# Patient Record
Sex: Male | Born: 1955
Health system: Southern US, Community
[De-identification: ages and names within clinical notes are randomized; demographics above are authoritative.]

## PROBLEM LIST (undated history)

## (undated) DIAGNOSIS — R011 Cardiac murmur, unspecified: Secondary | ICD-10-CM

## (undated) DIAGNOSIS — I82409 Acute embolism and thrombosis of unspecified deep veins of unspecified lower extremity: Secondary | ICD-10-CM

## (undated) DIAGNOSIS — E7800 Pure hypercholesterolemia, unspecified: Secondary | ICD-10-CM

## (undated) DIAGNOSIS — G4730 Sleep apnea, unspecified: Secondary | ICD-10-CM

## (undated) DIAGNOSIS — K219 Gastro-esophageal reflux disease without esophagitis: Secondary | ICD-10-CM

## (undated) DIAGNOSIS — E785 Hyperlipidemia, unspecified: Secondary | ICD-10-CM

## (undated) DIAGNOSIS — M1990 Unspecified osteoarthritis, unspecified site: Secondary | ICD-10-CM

## (undated) DIAGNOSIS — Z87442 Personal history of urinary calculi: Secondary | ICD-10-CM

## (undated) DIAGNOSIS — C801 Malignant (primary) neoplasm, unspecified: Secondary | ICD-10-CM

## (undated) HISTORY — DX: Hyperlipidemia, unspecified: E78.5

## (undated) HISTORY — PX: IRRIGATION AND DEBRIDEMENT SEBACEOUS CYST: SHX5255

## (undated) HISTORY — DX: Acute embolism and thrombosis of unspecified deep veins of unspecified lower extremity: I82.409

## (undated) HISTORY — DX: Pure hypercholesterolemia, unspecified: E78.00

## (undated) HISTORY — PX: WISDOM TOOTH EXTRACTION: SHX21

## (undated) HISTORY — PX: INGUINAL HERNIA REPAIR: SUR1180

---

## 2008-10-18 ENCOUNTER — Ambulatory Visit: Payer: Self-pay | Admitting: Pulmonary Disease

## 2008-10-18 DIAGNOSIS — R0989 Other specified symptoms and signs involving the circulatory and respiratory systems: Secondary | ICD-10-CM | POA: Insufficient documentation

## 2008-10-18 DIAGNOSIS — E785 Hyperlipidemia, unspecified: Secondary | ICD-10-CM | POA: Insufficient documentation

## 2008-10-18 DIAGNOSIS — R0609 Other forms of dyspnea: Secondary | ICD-10-CM

## 2008-10-18 DIAGNOSIS — G4730 Sleep apnea, unspecified: Secondary | ICD-10-CM | POA: Insufficient documentation

## 2008-11-16 ENCOUNTER — Ambulatory Visit (HOSPITAL_BASED_OUTPATIENT_CLINIC_OR_DEPARTMENT_OTHER): Admission: RE | Admit: 2008-11-16 | Discharge: 2008-11-16 | Payer: Self-pay | Admitting: Pulmonary Disease

## 2008-11-16 ENCOUNTER — Encounter: Payer: Self-pay | Admitting: Pulmonary Disease

## 2008-11-22 ENCOUNTER — Ambulatory Visit: Payer: Self-pay | Admitting: Pulmonary Disease

## 2008-12-02 ENCOUNTER — Encounter: Payer: Self-pay | Admitting: Pulmonary Disease

## 2008-12-30 ENCOUNTER — Encounter: Payer: Self-pay | Admitting: Pulmonary Disease

## 2009-01-09 ENCOUNTER — Encounter: Payer: Self-pay | Admitting: Pulmonary Disease

## 2010-05-28 ENCOUNTER — Ambulatory Visit: Payer: Self-pay | Admitting: Cardiology

## 2010-05-29 ENCOUNTER — Ambulatory Visit: Payer: Self-pay | Admitting: Cardiology

## 2010-06-12 ENCOUNTER — Encounter: Payer: Self-pay | Admitting: Pulmonary Disease

## 2010-07-16 NOTE — Letter (Signed)
Summary: [facility]   Imported By: Sherian Rein 07/10/2010 07:50:05  _____________________________________________________________________  External Attachment:    Type:   Image     Comment:   External Document

## 2010-07-20 ENCOUNTER — Other Ambulatory Visit (INDEPENDENT_AMBULATORY_CARE_PROVIDER_SITE_OTHER): Payer: BC Managed Care – PPO

## 2010-07-20 DIAGNOSIS — Z79899 Other long term (current) drug therapy: Secondary | ICD-10-CM

## 2010-07-20 DIAGNOSIS — E7800 Pure hypercholesterolemia, unspecified: Secondary | ICD-10-CM

## 2010-10-27 NOTE — Procedures (Signed)
NAME:  Phillip Hester, Phillip Hester            ACCOUNT NO.:  [id number]   MEDICAL RECORD NO.:  [id number]          PATIENT TYPE:  OUT   LOCATION:  SLEEP CENTER                 FACILITY:  [facility]   PHYSICIAN:  Oretha Milch, MD      DATE OF BIRTH:  [birth year]   DATE OF STUDY:  11/16/2008                            NOCTURNAL POLYSOMNOGRAM   REFERRING PHYSICIAN:   INDICATION FOR THE STUDY:  Loud snoring, witnessed apneas, leg and body  jerks and excessive somnolence in this 55 year old gentleman with a  weight of 221 pounds, height of 6 feet 2 inches, BMI of 28, neck size of  17 inches, Epworth sleepiness score of 5.   BEDTIME MEDICATIONS:  Melatonin.   This intervention polysomnogram was performed with a sleep technologist  in attendance.  EEG, EOG, EMG, EKG and respiratory parameters were  recorded.  Sleep stages arousals, limb movements and respiratory data  were scored according to criteria laid out by the American Academy of  Sleep Medicine.   SLEEP ARCHITECTURE:  Lights out was at 10:08 p.m.  Lights on was at 4:54  a.m.  CPAP was initiated at 32 minutes past midnight.  During the  diagnostic portion, total sleep time was 120 minutes with a sleep period  time of 132 minutes, sleep latency of 11.5 minutes, and a REM latency of  77.5 minutes with a sleep efficiency of 83%.  Sleep stages as a  percentage of total sleep time was N1 5.8%, N2 82%, N3 4%, and REM sleep  7.5.% (9 minutes).  He spent 38 minutes in supine sleep and 9 minutes in  supine REM sleep.  During the titration portion, REM sleep accounted for  109 minutes (47%) and supine REM sleep accounted for 66 minutes.  Long  stages of REM sleep was noted starting at 3:00 a.m.   RESPIRATORY DATA:  During the diagnostic portion, there were a total of  10 obstructive apnea, 0 central apneas, 1 mixed apnea, and 38 hypopneas  and 28 respiratory effort-related arousals with an apnea/hypopnea index  of 24.5 events per hour and a respiratory  disturbance index of 38.5  events per hour.  Due to this degree of respiratory disturbance, his  CPAP was initiated at 6 cm and titrated to 10 cm for RERAs and snoring.  The level of 8 cm were 60 minutes including 27 minutes of REM sleep, 3  obstructive apneas, and 1 central apnea were noted with the lowest  desaturation of 91%.  At a final level of 10 cm for 39 minutes including  17 minutes of REM sleep, no events of desaturation were noted.   AROUSAL DATA:  During the diagnostic portion, the arousal index was 48.5  events per hour.  Few arousals were noted at the higher levels of CPAP.   LIMB MOVEMENT DATA:  No significant limb movements were noted.   OXYGEN SATURATION DATA:  The lowest desaturation during the diagnostic  portion was 86%.  Oxygen saturation stayed about 91% during CPAP  titration.   CARDIAC DATA:  No significant arrhythmias were noted.  The low heart  rate during the diagnostic portion was 40 beats per minute.  The high  heart rate recorded was an artifact.   DISCUSSION:  Predominantly, supine-related events.  Supine AHI was 59  per hour and supine REM AHI was 87 events per hour, nonsupine AHI was 8  events per hour.  CPAP was titrated higher due to RERAs.   IMPRESSION:  1. Moderate obstructive sleep apnea with hypopneas causing sleep      fragmentation and oxygen desaturation, this was especially      pronounced during supine sleep.  2. No evidence of cardiac arrhythmias, limb movements, or behavioral      disturbance during sleep.  3. Sleep state misperception.  On the post-questionnaire study, the      patient seemed to over estimate his sleep latency and underestimate      his total sleep time.   RECOMMENDATIONS:  1. Treatment options for this degree of sleep apnea include CPAP      therapy. Although positional therapy could be considered, I doubt      that this would fully treat this degree of sleep disorder      breathing.  2. CPAP should be initiated  with a goal of 10 cm with a large full-      face mask.  3. He should be monitored for compliance at this level.  He should be      asked to avoid medications with sedative side effects.  He should      be cautioned against driving when sleepy.      Oretha Milch, MD  Electronically Signed     RVA/MEDQ  D:  11/22/2008 14:43:12  T:  11/23/2008 02:31:16  Job:  [id number]

## 2010-11-17 ENCOUNTER — Encounter: Payer: Self-pay | Admitting: Cardiology

## 2010-12-10 ENCOUNTER — Ambulatory Visit: Payer: BC Managed Care – PPO | Admitting: Cardiology

## 2010-12-11 ENCOUNTER — Ambulatory Visit (INDEPENDENT_AMBULATORY_CARE_PROVIDER_SITE_OTHER): Payer: BC Managed Care – PPO | Admitting: Cardiology

## 2010-12-11 ENCOUNTER — Other Ambulatory Visit (INDEPENDENT_AMBULATORY_CARE_PROVIDER_SITE_OTHER): Payer: BC Managed Care – PPO | Admitting: *Deleted

## 2010-12-11 ENCOUNTER — Encounter: Payer: Self-pay | Admitting: Cardiology

## 2010-12-11 VITALS — BP 122/84 | HR 62 | Ht 75.0 in | Wt 225.4 lb

## 2010-12-11 DIAGNOSIS — E785 Hyperlipidemia, unspecified: Secondary | ICD-10-CM

## 2010-12-11 DIAGNOSIS — Z9189 Other specified personal risk factors, not elsewhere classified: Secondary | ICD-10-CM

## 2010-12-11 DIAGNOSIS — Z136 Encounter for screening for cardiovascular disorders: Secondary | ICD-10-CM

## 2010-12-11 DIAGNOSIS — E7800 Pure hypercholesterolemia, unspecified: Secondary | ICD-10-CM

## 2010-12-11 LAB — LIPID PANEL
HDL: 46.2 mg/dL (ref >39.00)
Total CHOL/HDL Ratio: 5
Triglycerides: 165 mg/dL — ABNORMAL HIGH (ref 0.0–149.0)

## 2010-12-11 LAB — LDL CHOLESTEROL, DIRECT: Direct LDL: 179.5 mg/dL

## 2010-12-11 MED ORDER — CELECOXIB 100 MG PO CAPS: 100.0000 mg | ORAL_CAPSULE | ORAL | Status: DC | PRN

## 2010-12-11 MED ORDER — EPINEPHRINE 0.3 MG/0.3ML IJ DEVI: 0.3000 mg | Freq: Once | INTRAMUSCULAR | Status: DC

## 2010-12-11 NOTE — Patient Instructions (Signed)
Lab today--lipid profile 272.0  Take aspirin 81mg  daily.   Your physician wants you to follow-up in: 1 year with Dr Shirlee Latch.(June 2013). You will receive a reminder letter in the mail two months in advance. If you don't receive a letter, please call our office to schedule the follow-up appointment.

## 2010-12-13 DIAGNOSIS — Z9189 Other specified personal risk factors, not elsewhere classified: Secondary | ICD-10-CM | POA: Insufficient documentation

## 2010-12-13 NOTE — Assessment & Plan Note (Signed)
Last lipids were excellent but he was on simvastatin at that time. He now uses red yeast rice extract.  I will get a lipid profile to assess current levels.  Ideally would keep LDL < 100, but would accept LDL < 130.

## 2010-12-13 NOTE — Assessment & Plan Note (Signed)
Patient seems to be doing well from a cardiac perspective.  Would make sure that lipids are reasonably controlled.  He should also start ASA 81 mg daily.   I will see him back in a year.

## 2010-12-13 NOTE — Progress Notes (Signed)
PCP: Dr. Janace Hoard  55 yo with history of OSA and hyperlipidemia presents for cardiology followup.  He has been seen by Dr. Deborah Chalk in the past and is seen by me for the first time today.  He has a history of atypical chest pain and had a normal ETT-myoview with good exercise tolerance in 2010.  No chest pain since that time.  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  He denies exertional dyspnea and walks/swims for exercise with no problems.  He does not smoke. Blood pressure is under good control.  He has a diagnosis of hyperlipidemia and was on simvastatin in the past.  Last lipid check was on simvastatin and looked good.  He no longer is taking simvastatin and is instead on red yeast rice extract.    ECG: NSR, normal  Labs (2/12): K 4.9, creatinine 0.98, LDL 74, HDL 46  PMH: 1. OSA: on CPAP 2. Hyperlipidemia 3. H/o basal cell carcinoma 4. GERD 5. Echo (6/10): EF 60%, mild LVH, aortic sclerosis without stenosis, normal diastolic function. 6. ETT-myoview (2010): 13' exercise, EF 76%, no evidence for ischemia or infarction.   SH: Nonsmoker.  2-3 beers/week.  Married with 2 daughters.  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   FH: Father with CVA at 24, HTN  ROS: All systems reviewed and negative except as per HPI.   Allergies: Bee stings  Current Outpatient Prescriptions  Medication Sig Dispense Refill  . aspirin 81 MG tablet Take 81 mg by mouth daily. When remembered        . b complex vitamins tablet Take 1 tablet by mouth daily.        . celecoxib (CELEBREX) 100 MG capsule Take 1 capsule (100 mg total) by mouth as needed.  30 capsule  0  . Grape Seed 100 MG CAPS Take 1 capsule by mouth daily.        . Multiple Vitamin (MULTIVITAMIN) tablet Take 1 tablet by mouth daily.        . Omega-3 Fatty Acids (FISH OIL) 1000 MG CAPS Take 4 capsules by mouth daily.        . Red Yeast Rice 600 MG CAPS Take 1 capsule by mouth daily.        . vitamin C  (ASCORBIC ACID) 500 MG tablet Take 500 mg by mouth daily.        Marland Kitchen aspirin EC 81 MG tablet Take 1 tablet (81 mg total) by mouth daily.      Marland Kitchen EPINEPHrine (EPI-PEN) 0.3 mg/0.3 mL DEVI Inject 0.3 mLs (0.3 mg total) into the muscle once.  1 Device  0    BP 122/84  Pulse 62  Ht 6\' 3"  (1.905 m)  Wt 225 lb 6.4 oz (102.241 kg)  BMI 28.17 kg/m2 General: NAD Neck: No JVD, no thyromegaly or thyroid nodule.  Lungs: Clear to auscultation bilaterally with normal respiratory effort. CV: Nondisplaced PMI.  Heart regular S1/S2, no S3/S4, no murmur.  No peripheral edema.  No carotid bruit.  Normal pedal pulses.  Abdomen: Soft, nontender, no hepatosplenomegaly, no distention.  Neurologic: Alert and oriented x 3.  Psych: Normal affect. Extremities: No clubbing or cyanosis.

## 2010-12-22 ENCOUNTER — Other Ambulatory Visit: Payer: Self-pay | Admitting: Cardiology

## 2010-12-22 DIAGNOSIS — E7800 Pure hypercholesterolemia, unspecified: Secondary | ICD-10-CM

## 2010-12-22 MED ORDER — SIMVASTATIN 20 MG PO TABS: 20.0000 mg | ORAL_TABLET | Freq: Every evening | ORAL | Status: DC

## 2010-12-22 NOTE — Telephone Encounter (Signed)
Message copied by Jacqlyn Krauss on Tue Dec 22, 2010  3:02 PM ------      Message from: Laurey Morale      Created: Sun Dec 13, 2010  5:02 PM       LDL up to 180 (very high).  He should go back on simvastatin 20 mg daily with lipids/LFTs in 2 months.

## 2010-12-22 NOTE — Telephone Encounter (Signed)
Notes Recorded by Jacqlyn Krauss, RN on 12/22/2010 at 3:02 PM It discussed results with pt. I will mail him a prescription for Simvastatin 20mg  daily in the evening. He will return for a fasting lipid/liver profile 02/24/11. Notes Recorded by Jacqlyn Krauss, RN on 12/22/2010 at 9:01 AM Pt to call me back. Notes Recorded by Jacqlyn Krauss, RN on 12/21/2010 at 5:32 PM Pt not at home. NA at work number [phone] Notes Recorded by Jacqlyn Krauss, RN on 12/21/2010 at 3:22 PM LMTCB Notes Recorded by Jacqlyn Krauss, RN on 12/21/2010 at 12:13 PM LMTCB Notes Recorded by Marca Ancona, MD on 12/13/2010 at 5:02 PM LDL up to 180 (very high). He should go back on simvastatin 20 mg daily with lipids/LFTs in 2 months.

## 2010-12-22 NOTE — Telephone Encounter (Signed)
Returning call from Phillip L.

## 2010-12-23 NOTE — Telephone Encounter (Signed)
Script for simvastatin  mailed to pt . /cy

## 2011-02-24 ENCOUNTER — Other Ambulatory Visit (INDEPENDENT_AMBULATORY_CARE_PROVIDER_SITE_OTHER): Payer: BC Managed Care – PPO | Admitting: *Deleted

## 2011-02-24 DIAGNOSIS — E7800 Pure hypercholesterolemia, unspecified: Secondary | ICD-10-CM

## 2011-02-24 LAB — LIPID PANEL
Cholesterol: 159 mg/dL (ref 0–200)
HDL: 43.4 mg/dL (ref >39.00)
LDL Cholesterol: 93 mg/dL (ref 0–99)
Triglycerides: 115 mg/dL (ref 0.0–149.0)
VLDL: 23 mg/dL (ref 0.0–40.0)

## 2011-02-24 LAB — HEPATIC FUNCTION PANEL
Albumin: 4.2 g/dL (ref 3.5–5.2)
Alkaline Phosphatase: 38 U/L — ABNORMAL LOW (ref 39–117)
Total Protein: 6.8 g/dL (ref 6.0–8.3)

## 2011-02-26 ENCOUNTER — Encounter: Payer: Self-pay | Admitting: *Deleted

## 2011-06-14 ENCOUNTER — Other Ambulatory Visit: Payer: Self-pay

## 2011-06-14 MED ORDER — SIMVASTATIN 20 MG PO TABS: 20.0000 mg | ORAL_TABLET | Freq: Every evening | ORAL | Status: DC

## 2011-06-15 HISTORY — PX: KNEE SURGERY: SHX244

## 2011-06-15 HISTORY — PX: SHOULDER SURGERY: SHX246

## 2011-06-17 ENCOUNTER — Other Ambulatory Visit: Payer: Self-pay | Admitting: *Deleted

## 2011-06-17 MED ORDER — SIMVASTATIN 20 MG PO TABS: 20.0000 mg | ORAL_TABLET | Freq: Every evening | ORAL | Status: DC

## 2011-11-03 DIAGNOSIS — N281 Cyst of kidney, acquired: Secondary | ICD-10-CM | POA: Insufficient documentation

## 2011-11-03 DIAGNOSIS — N529 Male erectile dysfunction, unspecified: Secondary | ICD-10-CM | POA: Insufficient documentation

## 2011-11-03 DIAGNOSIS — N200 Calculus of kidney: Secondary | ICD-10-CM | POA: Insufficient documentation

## 2011-11-10 ENCOUNTER — Ambulatory Visit
Admission: RE | Admit: 2011-11-10 | Discharge: 2011-11-10 | Disposition: A | Discharge status: Home / Self Care | Payer: BC Managed Care – PPO | Source: Ambulatory Visit | Attending: Sports Medicine | Admitting: Sports Medicine

## 2011-11-10 ENCOUNTER — Other Ambulatory Visit: Payer: Self-pay | Admitting: Sports Medicine

## 2011-11-10 DIAGNOSIS — M549 Dorsalgia, unspecified: Secondary | ICD-10-CM

## 2011-11-10 DIAGNOSIS — M25569 Pain in unspecified knee: Secondary | ICD-10-CM

## 2012-01-26 ENCOUNTER — Telehealth: Payer: Self-pay | Admitting: Cardiology

## 2012-01-26 NOTE — Telephone Encounter (Signed)
New Problem:     I called the patient and was unable to reach them. I left a message on their voicemail with my name, the reason I called, the name of his physician, and a number to call back to schedule their appointment. 

## 2012-02-21 ENCOUNTER — Encounter: Payer: Self-pay | Admitting: Cardiology

## 2012-03-06 ENCOUNTER — Ambulatory Visit (INDEPENDENT_AMBULATORY_CARE_PROVIDER_SITE_OTHER): Payer: BC Managed Care – PPO | Admitting: Family Medicine

## 2012-03-06 VITALS — BP 106/76 | HR 81 | Temp 98.2°F | Resp 16 | Ht 75.0 in | Wt 224.0 lb

## 2012-03-06 DIAGNOSIS — T7840XA Allergy, unspecified, initial encounter: Secondary | ICD-10-CM

## 2012-03-06 MED ORDER — PREDNISONE 20 MG PO TABS: ORAL_TABLET | ORAL | Status: DC

## 2012-03-06 NOTE — Progress Notes (Signed)
[facility] [address] [place] [address] [phone]  Date:  03/06/2012   Name:  Phillip Hester   DOB:  January 21, 1956   MRN:  [id number]  PCP:  Sheila Oats, MD    Chief Complaint: Facial Swelling and Rash   History of Present Illness:  Phillip Hester is a 56 y.o. very pleasant male patient who presents with the following:  On Saturday pm he was doing some work outside.  He felt a little irritation on the corner or his right eye,? like an insect bite but not a bee sting.  He brushed the irritation away and did not think much else of it.  Yesterday he had some swelling around the eye. He tried some benadryl.  Then today he woke up with worse swelling around the right eye- both the upper and lower lids are swollen.   He also developed an itchy rash down his right neck and shoulder over night.   The swollen eye feels warm, and is slightly itchy.  No pain.   They eyeball itself seems ok- it is not tender and his vision seems ok.   No crusting or discharge from the eye.   He has not noted any lip or tongue swelling, no SOB.  He does have an epipen at home.    Maclan has felt "nutty" with prednisone in the past, but has not had an allergic reaction or psychosis.  He is willing to take this again if it will get his swelling better faster.   Patient Active Problem List  Diagnosis  . HYPERLIPIDEMIA  . OBSTRUCTIVE SLEEP APNEA  . SNORING  . Cardiovascular risk factor    Past Medical History  Diagnosis Date  . Hyperlipidemia     Past Surgical History  Procedure Date  . Wisdom tooth extraction   . Irrigation and debridement sebaceous cyst     History  Substance Use Topics  . Smoking status: Never Smoker   . Smokeless tobacco: Never Used  . Alcohol Use: 1.0 - 1.5 oz/week    2-3 drink(s) per week     occasional    No family history on file.  Allergies  Allergen Reactions  . Prednisone     REACTION: "drove me nutty"    Medication list  has been reviewed and updated.  Current Outpatient Prescriptions on File Prior to Visit  Medication Sig Dispense Refill  . aspirin 81 MG tablet Take 81 mg by mouth daily. When remembered        . celecoxib (CELEBREX) 100 MG capsule Take 1 capsule (100 mg total) by mouth as needed.  30 capsule  0  . EPINEPHrine (EPI-PEN) 0.3 mg/0.3 mL DEVI Inject 0.3 mLs (0.3 mg total) into the muscle once.  1 Device  0  . Multiple Vitamin (MULTIVITAMIN) tablet Take 1 tablet by mouth daily.        . Omega-3 Fatty Acids (FISH OIL) 1000 MG CAPS Take 4 capsules by mouth daily.        . pantoprazole (PROTONIX) 20 MG tablet Take 20 mg by mouth daily.      . Red Yeast Rice 600 MG CAPS Take 1 capsule by mouth daily.        . vitamin C (ASCORBIC ACID) 500 MG tablet Take 500 mg by mouth daily.        Marland Kitchen aspirin EC 81 MG tablet Take 1 tablet (81 mg total) by mouth daily.      Marland Kitchen b complex  vitamins tablet Take 1 tablet by mouth daily.        . Grape Seed 100 MG CAPS Take 1 capsule by mouth daily.        . simvastatin (ZOCOR) 20 MG tablet Take 1 tablet (20 mg total) by mouth every evening.  30 tablet  6    Review of Systems:  As per HPI- otherwise negative.   Physical Examination: Filed Vitals:   03/06/12 0944  BP: 106/76  Pulse: 81  Temp: 98.2 F (36.8 C)  Resp: 16   Filed Vitals:   03/06/12 0944  Height: 6\' 3"  (1.905 m)  Weight: 224 lb (101.606 kg)   Body mass index is 28.00 kg/(m^2). Ideal Body Weight: Weight in (lb) to have BMI = 25: 199.6   GEN: WDWN, NAD, Non-toxic, A & O x 3 HEENT: Atraumatic, Normocephalic. Neck supple. No masses, No LAD.  Bilateral TM wnl, oropharynx normal.  PEERL,EOMI. Swelling of the right upper and lower lid, not enough to close the eye.    There is swelling and redness, and a rhus dermatitis rash above the right eye, onto the right forehead and down the right side of his neck.  No vesicles or suggestion of zoster.  No discharge from eye, no injection of the conjunctiva.    Ears and Nose: No external deformity. CV: RRR, No M/G/R. No JVD. No thrill. No extra heart sounds. PULM: CTA B, no wheezes, crackles, rhonchi. No retractions. No resp. distress. No accessory muscle use. EXTR: No c/c/e NEURO Normal gait.  PSYCH: Normally interactive. Conversant. Not depressed or anxious appearing.  Calm demeanor.    Assessment and Plan: 1. Allergic reaction  predniSONE (DELTASONE) 20 MG tablet   [name] is likley having a contact allergic reaction- perhaps due to a plant.  I think an insect sting reaction is less likely given the delay in symptoms and the pattern around the eye AND down the neck.  Discussed this with him in detail. He wonders if a cream will be helpful, but as his symptoms are worse right around his eye systemic treatment is probably better. Let him know that he does not have to take pred and that he will get better with time, but he would like to try and get better more quickly and is ok with trying prednisone.  He can stop at any time if he cannot tolerate the medication.    See pt instructions for more.   Abbe Amsterdam, MD

## 2012-03-06 NOTE — Patient Instructions (Addendum)
Take benadryl as needed- you may want to just use this at night.  You can also take claritin or zyrtec during the day.  Take a zantac pill once a day as well  Let me know if you are not able to tolerate the prednisone/ steroid pill.  You can stop at any time.    Let me know if you are not better within the next 2 days- Sooner if worse.

## 2012-03-22 ENCOUNTER — Other Ambulatory Visit: Payer: Self-pay | Admitting: Physical Medicine and Rehabilitation

## 2012-03-22 ENCOUNTER — Ambulatory Visit
Admission: RE | Admit: 2012-03-22 | Discharge: 2012-03-22 | Disposition: A | Discharge status: Home / Self Care | Payer: BC Managed Care – PPO | Source: Ambulatory Visit | Attending: Physical Medicine and Rehabilitation | Admitting: Physical Medicine and Rehabilitation

## 2012-03-22 DIAGNOSIS — R918 Other nonspecific abnormal finding of lung field: Secondary | ICD-10-CM

## 2012-05-03 DIAGNOSIS — R6882 Decreased libido: Secondary | ICD-10-CM | POA: Insufficient documentation

## 2012-05-12 ENCOUNTER — Encounter: Payer: Self-pay | Admitting: *Deleted

## 2012-05-15 ENCOUNTER — Ambulatory Visit (INDEPENDENT_AMBULATORY_CARE_PROVIDER_SITE_OTHER): Payer: BC Managed Care – PPO | Admitting: Cardiology

## 2012-05-15 ENCOUNTER — Other Ambulatory Visit: Payer: Self-pay | Admitting: Cardiology

## 2012-05-15 ENCOUNTER — Encounter: Payer: Self-pay | Admitting: Cardiology

## 2012-05-15 VITALS — BP 126/90 | HR 69 | Ht 75.0 in | Wt 227.1 lb

## 2012-05-15 DIAGNOSIS — E7800 Pure hypercholesterolemia, unspecified: Secondary | ICD-10-CM

## 2012-05-15 DIAGNOSIS — E785 Hyperlipidemia, unspecified: Secondary | ICD-10-CM

## 2012-05-15 DIAGNOSIS — Z9189 Other specified personal risk factors, not elsewhere classified: Secondary | ICD-10-CM

## 2012-05-15 NOTE — Patient Instructions (Addendum)
Your physician recommends that you have a NMR,lipoprofile and a lipoprotein A (LPA) today.   Your physician wants you to follow-up in: 1 year with Dr Shirlee Latch. (December 2014). You will receive a reminder letter in the mail two months in advance. If you don't receive a letter, please call our office to schedule the follow-up appointment.

## 2012-05-15 NOTE — Progress Notes (Signed)
Patient ID: Phillip Hester, male   DOB: 1955-06-22, 56 y.o.   MRN: [id number] PCP: Dr. Janace Hoard  56 yo with history of OSA and hyperlipidemia presents for cardiology followup.  He has a history of atypical chest pain and had a normal ETT-myoview with good exercise tolerance in 2010.  No chest pain since that time.  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  He denies exertional dyspnea and walks/swims for exercise with no problems.  He does not smoke. Blood pressure is under good control.  He has a diagnosis of hyperlipidemia and was on simvastatin in the past.  He is now taking only red yeast rice extract.  Lipids looked good in 9/12.  Since I last saw him, he had knee surgery and shoulder surgery with no problems.  He has been tolerating CPAP.   ECG: NSR, inferior T wave inversions.   Labs (2/12): K 4.9, creatinine 0.98, LDL 74, HDL 46 Labs (9/12): LDL 93, HDL 43  PMH: 1. OSA: on CPAP 2. Hyperlipidemia 3. H/o basal cell carcinoma 4. GERD 5. Echo (6/10): EF 60%, mild LVH, aortic sclerosis without stenosis, normal diastolic function. 6. ETT-myoview (2010): 13' exercise, EF 76%, no evidence for ischemia or infarction.   SH: Nonsmoker.  2-3 beers/week.  Married with 2 daughters.  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   FH: Father with CVA at 81, HTN  Allergies: Bee stings  Current Outpatient Prescriptions  Medication Sig Dispense Refill  . aspirin 81 MG tablet Take 81 mg by mouth daily. When remembered        . aspirin EC 81 MG tablet Take 1 tablet (81 mg total) by mouth daily.      . celecoxib (CELEBREX) 100 MG capsule Take 1 capsule (100 mg total) by mouth as needed.  30 capsule  0  . EPINEPHrine (EPI-PEN) 0.3 mg/0.3 mL DEVI Inject 0.3 mLs (0.3 mg total) into the muscle once.  1 Device  0  . HYDROcodone-acetaminophen (NORCO/VICODIN) 5-325 MG per tablet as needed.      . Multiple Vitamin (MULTIVITAMIN) tablet Take 1 tablet by mouth daily.        .  Omega-3 Fatty Acids (FISH OIL) 1000 MG CAPS Take 4 capsules by mouth daily.        . OXYCODONE HCL PO Take by mouth.      . oxyCODONE-acetaminophen (PERCOCET/ROXICET) 5-325 MG per tablet as needed.      . pantoprazole (PROTONIX) 20 MG tablet Take 20 mg by mouth daily.      . Red Yeast Rice 600 MG CAPS Take 1 capsule by mouth daily.        . vitamin C (ASCORBIC ACID) 500 MG tablet Take 500 mg by mouth daily.          BP 126/90  Pulse 69  Ht 6\' 3"  (1.905 m)  Wt 227 lb 1.9 oz (103.021 kg)  BMI 28.39 kg/m2 General: NAD Neck: No JVD, no thyromegaly or thyroid nodule.  Lungs: Clear to auscultation bilaterally with normal respiratory effort. CV: Nondisplaced PMI.  Heart regular S1/S2, no S3/S4, no murmur.  No peripheral edema.  No carotid bruit.  Normal pedal pulses.  Abdomen: Soft, nontender, no hepatosplenomegaly, no distention.  Neurologic: Alert and oriented x 3.  Psych: Normal affect. Extremities: No clubbing or cyanosis.   Assessment/Plan:  HYPERLIPIDEMIA  He is not on a statin at this point, taking red yeast rice extract.  I will get a  Lipomed profile and Lp(a) to further risk stratify him to decide about whether or not to restart statin.  Cardiovascular risk factor  Patient seems to be doing well from a cardiac perspective. Would make sure that lipids are reasonably controlled. He should continue ASA 81 mg daily.   Followup in 1 year.   Marca Ancona 05/15/2012 2:15 PM

## 2012-05-17 LAB — OTHER SOLSTAS TEST
HDL Size: 8.2 nm — ABNORMAL LOW (ref >=9.2)
LP-IR Score: 65 — ABNORMAL HIGH (ref <=45)
Large HDL-P: <1.3 umol/L — ABNORMAL LOW (ref >=4.8)
Large VLDL-P: 3.1 nmol/L — ABNORMAL HIGH (ref <=2.7)
VLDL Size: 45.4 nm (ref <=46.6)

## 2012-08-03 DIAGNOSIS — F32A Depression, unspecified: Secondary | ICD-10-CM | POA: Insufficient documentation

## 2012-08-24 ENCOUNTER — Ambulatory Visit: Payer: BC Managed Care – PPO | Admitting: Family Medicine

## 2012-08-24 VITALS — BP 118/82 | HR 72 | Resp 18 | Ht 74.0 in | Wt 231.0 lb

## 2012-08-24 DIAGNOSIS — F32A Depression, unspecified: Secondary | ICD-10-CM

## 2012-08-24 DIAGNOSIS — F329 Major depressive disorder, single episode, unspecified: Secondary | ICD-10-CM

## 2012-08-24 MED ORDER — DULOXETINE HCL 30 MG PO CPEP: 30.0000 mg | ORAL_CAPSULE | Freq: Every day | ORAL | Status: DC

## 2012-08-24 NOTE — Progress Notes (Signed)
Subjective: Patient is here because of his fatigue and depression. Last year his wife had an affair. This has devastated him. 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 He has not get a lot of relaxation time. He has 2 children. He and his wife it stayed together, and have been getting counseling from Karmen Bongo.  He has also talked with one of his pastors at [employer] where he attends. They're still working on their marriage, but she is talking about moving out. He's tried testosterone, and has not given him energy. He has seen his urologist. His urologist recommended he see somebody and get something for depression. His counts are recommended Cymbalta.  Objective: Depressed-appearing man, not suicidal. A long talk.  Assessment: Marital dysfunction Depression  Plan: Cymbalta 30 daily for one week and then twice daily. Advise that we not make any changes such as with his testosterone at this time for see how the Cymbalta does. See him back in about 3 weeks. Continue getting his counseling.

## 2012-09-19 ENCOUNTER — Telehealth: Payer: Self-pay | Admitting: Cardiology

## 2012-09-19 NOTE — Telephone Encounter (Signed)
New problem   Pt stated some one called him name Eunice Blase that's all he knew. He want someone to call him back.

## 2012-09-19 NOTE — Telephone Encounter (Signed)
LMTCB

## 2012-09-20 NOTE — Telephone Encounter (Signed)
I have been unable to reach pt by telephone to let him know that I did not call him.

## 2013-05-31 ENCOUNTER — Encounter: Payer: Self-pay | Admitting: Cardiology

## 2013-05-31 ENCOUNTER — Other Ambulatory Visit: Payer: Self-pay | Admitting: Cardiology

## 2013-05-31 ENCOUNTER — Encounter (INDEPENDENT_AMBULATORY_CARE_PROVIDER_SITE_OTHER): Payer: Self-pay

## 2013-05-31 ENCOUNTER — Ambulatory Visit (INDEPENDENT_AMBULATORY_CARE_PROVIDER_SITE_OTHER): Payer: BC Managed Care – PPO | Admitting: Cardiology

## 2013-05-31 VITALS — BP 124/86 | HR 94 | Ht 75.0 in | Wt 234.0 lb

## 2013-05-31 DIAGNOSIS — E785 Hyperlipidemia, unspecified: Secondary | ICD-10-CM

## 2013-05-31 DIAGNOSIS — Z9189 Other specified personal risk factors, not elsewhere classified: Secondary | ICD-10-CM

## 2013-05-31 NOTE — Patient Instructions (Signed)
Your physician recommends that you have a NMR lipomed panel today.   Your physician wants you to follow-up in: 1 year with Dr Shirlee Latch. (December 2015).  You will receive a reminder letter in the mail two months in advance. If you don't receive a letter, please call our office to schedule the follow-up appointment.

## 2013-06-01 LAB — NMR LIPOPROFILE WITH LIPIDS
HDL Particle Number: 36 umol/L (ref >=30.5)
HDL-C: 47 mg/dL (ref >=40)
LDL (calc): 126 mg/dL — ABNORMAL HIGH (ref <100)
LP-IR Score: 47 — ABNORMAL HIGH (ref <=45)
Large HDL-P: 5.1 umol/L (ref >=4.8)
Triglycerides: 278 mg/dL — ABNORMAL HIGH (ref <150)

## 2013-06-01 NOTE — Progress Notes (Signed)
Patient ID: Phillip Hester, male   DOB: 1956/02/10, 57 y.o.   MRN: [id number] PCP: Dr. Janace Hoard  57 yo with history of OSA and hyperlipidemia presents for cardiology followup.  He has a history of atypical chest pain and had a normal ETT-myoview with good exercise tolerance in 2010.  No chest pain since that time.  He denies exertional dyspnea and walks/swims for exercise with no problems.  He does not smoke. Blood pressure is under good control.  He has a diagnosis of hyperlipidemia and was on simvastatin in the past.  He is now taking only red yeast rice extract.  Last year I had wanted him to start atorvastatin due to a high LDL particle number but he never started on it.  He is using CPAP.  He is under a lot of stress, going through separation with wife.   ECG: NSR, LVH, inferior Qs   Labs (2/12): K 4.9, creatinine 0.98, LDL 74, HDL 46 Labs (9/12): LDL 93, HDL 43 Labs (2/14): LDL particle number 2303  PMH: 1. OSA: on CPAP 2. Hyperlipidemia 3. H/o basal cell carcinoma 4. GERD 5. Echo (6/10): EF 60%, mild LVH, aortic sclerosis without stenosis, normal diastolic function. 6. ETT-myoview (2010): 13' exercise, EF 76%, no evidence for ischemia or infarction.   SH: Nonsmoker.  2-3 beers/week.  Separated, with 2 daughters.  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   FH: Father with CVA at 72, HTN  Allergies: Bee stings  Current Outpatient Prescriptions  Medication Sig Dispense Refill  . aspirin EC 81 MG tablet Take 1 tablet (81 mg total) by mouth daily.      . celecoxib (CELEBREX) 100 MG capsule Take 1 capsule (100 mg total) by mouth as needed.  30 capsule  0  . CIALIS 5 MG tablet Take by mouth daily as needed.       . clomiPHENE (CLOMID) 50 MG tablet Take 50 mg by mouth daily.      . DULoxetine (CYMBALTA) 30 MG capsule Take 1 capsule (30 mg total) by mouth daily.  60 capsule  3  . EPINEPHrine (EPI-PEN) 0.3 mg/0.3 mL DEVI Inject 0.3 mLs (0.3 mg total) into the muscle once.  1 Device  0   . Multiple Vitamin (MULTIVITAMIN) tablet Take 1 tablet by mouth daily.        . Nutritional Supplements (DHEA PO) Take by mouth.      . Omega-3 Fatty Acids (FISH OIL) 1000 MG CAPS Take 4 capsules by mouth daily.        . pantoprazole (PROTONIX) 20 MG tablet Take 20 mg by mouth daily.      . Red Yeast Rice 600 MG CAPS Take 1 capsule by mouth daily.        . vitamin C (ASCORBIC ACID) 500 MG tablet Take 500 mg by mouth daily.         No current facility-administered medications for this visit.    BP 124/86  Pulse 94  Ht 6\' 3"  (1.905 m)  Wt 106.142 kg (234 lb)  BMI 29.25 kg/m2 General: NAD Neck: No JVD, no thyromegaly or thyroid nodule.  Lungs: Clear to auscultation bilaterally with normal respiratory effort. CV: Nondisplaced PMI.  Heart regular S1/S2, no S3/S4, no murmur.  No peripheral edema.  No carotid bruit.  Normal pedal pulses.  Abdomen: Soft, nontender, no hepatosplenomegaly, no distention.  Neurologic: Alert and oriented x 3.  Psych: Normal affect. Extremities: No clubbing or cyanosis.   Assessment/Plan:  HYPERLIPIDEMIA  He  is not on a statin at this point, taking red yeast rice extract.  He had a very high LDL particle number in the past, and I had wanted him to start on atorvastatin but he never took it.  I will repeat a Lipomed profile now.  If particle number still high, would again suggest statin.   Cardiovascular risk factor  Patient seems to be doing well from a cardiac perspective. Would make sure that lipids are reasonably controlled. He should continue ASA 81 mg daily.   Followup in 1 year.   Marca Ancona 06/01/2013

## 2013-06-04 ENCOUNTER — Telehealth: Payer: Self-pay | Admitting: *Deleted

## 2013-06-04 MED ORDER — ATORVASTATIN CALCIUM 20 MG PO TABS: 20.0000 mg | ORAL_TABLET | Freq: Every day | ORAL | Status: DC

## 2013-06-04 NOTE — Telephone Encounter (Signed)
Message copied by Barrie Folk on Mon Jun 04, 2013 10:35 AM ------      Message from: Laurey Morale      Created: Sun Jun 03, 2013  8:50 PM       Still high LDL particle number.  Would stop red yeast rice and start atorvastatin 20 mg daily with lipids/LFTs in 2 months. ------

## 2013-06-04 NOTE — Telephone Encounter (Signed)
I spoke with pt about his lab results & recommendations by Dr. Shirlee Latch. Pt agrees to start Atorvastatin 20mg  with repeat labs end of February first of March. He will stop red yeast rice. Prescription sent  Copy of labs mailed to pt Mylo Red RN

## 2013-07-18 ENCOUNTER — Ambulatory Visit (INDEPENDENT_AMBULATORY_CARE_PROVIDER_SITE_OTHER): Payer: BC Managed Care – PPO | Admitting: *Deleted

## 2013-07-18 ENCOUNTER — Telehealth: Payer: Self-pay | Admitting: Cardiology

## 2013-07-18 VITALS — BP 138/88 | HR 72 | Wt 232.4 lb

## 2013-07-18 DIAGNOSIS — R209 Unspecified disturbances of skin sensation: Secondary | ICD-10-CM

## 2013-07-18 DIAGNOSIS — R200 Anesthesia of skin: Secondary | ICD-10-CM

## 2013-07-18 DIAGNOSIS — R202 Paresthesia of skin: Principal | ICD-10-CM

## 2013-07-18 NOTE — Telephone Encounter (Signed)
New Message  Pt has has a tingling- numbness in hands and arms// requesting a same day/morning appt with [facility]// please assist

## 2013-07-18 NOTE — Telephone Encounter (Signed)
See nurse visit

## 2013-07-18 NOTE — Progress Notes (Signed)
Was a walk in.  States he called this morning (10:39) stating he was having tingling-numbness in hands and arm and was requesting an app w/Dr. Aundra Dubin. I  spoke w/Anne Lankford,RN who states she had looked at message but since not urgent she was going to call him later.  They are DOD today.  Spoke w/pt who states he started having (L) arm tingling and numbness last PM before he went to bed.  Denies CP, slurred speech, weakness in extremity. Advised Dr. Aundra Dubin and he talked with pt.  Suggested it may be muscular skeleton  related.  Advised to try Advil or Aleve to see if resolved. Pt states he is leaving to go out of town this afternoon and would be driving for 5 hrs and wanted to make sure something wasn't wrong.  Since he has no history of CAD Dr. Aundra Dubin suggested that he may have slept on arm and may be due to muscle strain.  Pt verbalizes that he is OK with watching symptoms and will call back if not resolved. No change in medications per Dr. Kirk Ruths.  BP 138/88; 72 HR.

## 2013-08-14 ENCOUNTER — Ambulatory Visit: Payer: BC Managed Care – PPO | Admitting: Internal Medicine

## 2013-08-14 VITALS — BP 140/84 | HR 70 | Temp 98.0°F | Resp 16 | Ht 74.5 in | Wt 228.0 lb

## 2013-08-14 DIAGNOSIS — R059 Cough, unspecified: Secondary | ICD-10-CM

## 2013-08-14 DIAGNOSIS — J209 Acute bronchitis, unspecified: Secondary | ICD-10-CM

## 2013-08-14 DIAGNOSIS — R05 Cough: Secondary | ICD-10-CM

## 2013-08-14 MED ORDER — HYDROCODONE-ACETAMINOPHEN 7.5-325 MG/15ML PO SOLN: 5.0000 mL | Freq: Four times a day (QID) | ORAL | Status: DC | PRN

## 2013-08-14 MED ORDER — AZITHROMYCIN 500 MG PO TABS: 500.0000 mg | ORAL_TABLET | Freq: Every day | ORAL | Status: DC

## 2013-08-14 NOTE — Progress Notes (Signed)
   Subjective:    Patient ID: Phillip Hester, male    DOB: [birth year], 57 y.o.   MRN: [id number]  HPI    Review of Systems     Objective:   Physical Exam        Assessment & Plan:

## 2013-08-14 NOTE — Patient Instructions (Signed)

## 2013-08-14 NOTE — Progress Notes (Signed)
   Subjective:    Patient ID: Phillip Hester, male    DOB: 10/05/1955, 58 y.o.   MRN: [id number]  HPI 58 year old presents with chest congestion and cough. It's been going on since Sunday. He has been taking [facility] brand decongestant since yesterday. He is able to cough up some phlegm; it is yellowish in color. His daughter had pneumonia about a month ago, but otherwise has not been around anyone that's sick. He works in Scientist, research (medical). Last week he felt more tired than usual. Had some chills yesterday morning. Had body aches last night, but seems to be doing better this morning. No nausea or emesis. No sob or wheezing.    Review of Systems     Objective:   Physical Exam  Vitals reviewed. Constitutional: He is oriented to person, place, and time. He appears well-developed and well-nourished. No distress.  HENT:  Head: Normocephalic.  Right Ear: External ear normal.  Left Ear: External ear normal.  Nose: Mucosal edema, rhinorrhea and sinus tenderness present.  Mouth/Throat: Oropharynx is clear and moist.  Eyes: EOM are normal.  Neck: Normal range of motion. Neck supple.  Cardiovascular: Normal rate, regular rhythm, normal heart sounds and intact distal pulses.   Pulmonary/Chest: Not tachypneic. No respiratory distress. He has no decreased breath sounds. He has wheezes. He has rhonchi. He has no rales.  Lymphadenopathy:    He has no cervical adenopathy.  Neurological: He is alert and oriented to person, place, and time. He exhibits normal muscle tone.  Psychiatric: He has a normal mood and affect. His behavior is normal.          Assessment & Plan:  Bronchitis Zithromax 500mg Phillip Hester

## 2013-08-15 ENCOUNTER — Ambulatory Visit (INDEPENDENT_AMBULATORY_CARE_PROVIDER_SITE_OTHER): Payer: BC Managed Care – PPO | Admitting: Pulmonary Disease

## 2013-08-15 ENCOUNTER — Encounter: Payer: Self-pay | Admitting: Pulmonary Disease

## 2013-08-15 VITALS — BP 138/88 | HR 88 | Temp 98.3°F | Wt 232.6 lb

## 2013-08-15 DIAGNOSIS — G4733 Obstructive sleep apnea (adult) (pediatric): Secondary | ICD-10-CM

## 2013-08-15 DIAGNOSIS — G4730 Sleep apnea, unspecified: Secondary | ICD-10-CM

## 2013-08-15 NOTE — Progress Notes (Signed)
Subjective:    Patient ID: Phillip Hester, male    DOB: Jul 12, 1955, 58 y.o.   MRN: [id number]  HPI  52/M for management of obstructive sleep apnea.  He presented with loud snoring and witnessed apneas.  11/2008 >AHI 24/h,  Supine AHI was 59 per hour and supine REM AHI was 87 events per hour, nonsupine AHI was 8  events per hour.   -predominant supine related obstructive sleep apnea, corrected by CPAP 10 cm, nasal pillows  Epworth Sleepiness Score is 5, but he does report lack of energy, and tiredness.  Bed time, is 10:30 to 11:30 PM, sleep latency a few minutes, he sleeps on his side, wakes up a couple of times to urinate without any post void sleep latency, gets out of bed at 7 AM, occasionally unrefreshed. Denies dry mouth, or headaches. His has gained 5 pounds in the last 2 years, tries to avoid caffeinated beverages. He is divorced now & no partner history is available  He has not been very compliant with CPAP, he travels a lot and is not particular about taking his machine with him. He does feel better rested after sleeping on CPAP. CPAP download does show adequate control of events and 10 cm but sporadic usage. Leak is minimal.  Past Medical History  Diagnosis Date  . Hyperlipidemia   . Pure hypercholesterolemia     Past Surgical History  Procedure Laterality Date  . Wisdom tooth extraction    . Irrigation and debridement sebaceous cyst    . Knee surgery Left 2013  . Shoulder surgery Right 2013   Allergies  Allergen Reactions  . Prednisone     REACTION: "drove me nutty"    History   Social History  . Marital Status: Legally Separated    Spouse Name: N/A    Number of Children: 2  . Years of Education: N/A   Occupational History  . Business Owner    Social History Main Topics  . Smoking status: Never Smoker   . Smokeless tobacco: Never Used  . Alcohol Use: 1 - 1.5 oz/week    2-3 drink(s) per week     Comment: occasional  . Drug Use: No  . Sexual Activity:  Yes   Other Topics Concern  . Not on file   Social History Narrative  . No narrative on file    Family History  Problem Relation Age of Onset  . Arthritis Mother   . Dementia Father   . Cancer Maternal Grandfather   . Cancer Paternal Grandfather      Review of Systems  Constitutional: Negative for fever and unexpected weight change.  HENT: Negative for congestion, dental problem, ear pain, nosebleeds, postnasal drip, rhinorrhea, sinus pressure, sneezing, sore throat and trouble swallowing.   Eyes: Negative for redness and itching.  Respiratory: Positive for cough. Negative for chest tightness, shortness of breath and wheezing.   Cardiovascular: Negative for palpitations and leg swelling.  Gastrointestinal: Negative for nausea and vomiting.  Genitourinary: Negative for dysuria.  Musculoskeletal: Negative for joint swelling.  Skin: Negative for rash.  Neurological: Negative for headaches.  Hematological: Does not bruise/bleed easily.  Psychiatric/Behavioral: Negative for dysphoric mood. The patient is not nervous/anxious.        Objective:   Physical Exam  Gen. Pleasant, well-nourished, in no distress ENT - no lesions, no post nasal drip Neck: No JVD, no thyromegaly, no carotid bruits Lungs: no use of accessory muscles, no dullness to percussion, clear without rales or rhonchi  Cardiovascular: Rhythm regular, heart sounds  normal, no murmurs or gallops, no peripheral edema Musculoskeletal: No deformities, no cyanosis or clubbing        Assessment & Plan:

## 2013-08-15 NOTE — Assessment & Plan Note (Signed)
We discussed issues that were decreasing his CPAP compliance. We will get a new supplies, a prescription was given to him so that he can get these from the Internet. Download will be obtained in a month's time and feedback provided. We discussed cardiovascular benefits of CPAP therapy. We also discussed positional therapy when he travels. Weight loss encouraged, compliance with goal of at least 4-6 hrs every night is the expectation. Advised against medications with sedative side effects Cautioned against driving when sleepy - understanding that sleepiness will vary on a day to day basis

## 2013-08-15 NOTE — Patient Instructions (Signed)
We will connect you with a local supplier You have moderate obstructive sleep apnea - worse on your back Get back on your CPAP machine- it is set at 10 cm Rx for cpap supplies Check download in 1 month & we will call you with feedback

## 2013-09-03 ENCOUNTER — Institutional Professional Consult (permissible substitution): Payer: BC Managed Care – PPO | Admitting: Pulmonary Disease

## 2013-09-27 ENCOUNTER — Telehealth: Payer: Self-pay | Admitting: Pulmonary Disease

## 2013-09-27 NOTE — Telephone Encounter (Signed)
noted 

## 2013-10-25 ENCOUNTER — Telehealth: Payer: Self-pay | Admitting: *Deleted

## 2013-10-25 DIAGNOSIS — G4733 Obstructive sleep apnea (adult) (pediatric): Secondary | ICD-10-CM

## 2013-10-25 NOTE — Telephone Encounter (Signed)
Pt had bad pressure sensor. Will need to recheck download in 1 month Order sent

## 2014-05-20 ENCOUNTER — Other Ambulatory Visit: Payer: Self-pay | Admitting: Family Medicine

## 2014-05-21 NOTE — Telephone Encounter (Signed)
Pt hasn't been seen here for depression since 08/24/12, and not even for acute issue since this past March. Do you still want me to give a 1 mos RF w/RTC message?

## 2014-05-31 ENCOUNTER — Other Ambulatory Visit: Payer: Self-pay | Admitting: Family Medicine

## 2014-06-11 ENCOUNTER — Ambulatory Visit (INDEPENDENT_AMBULATORY_CARE_PROVIDER_SITE_OTHER): Payer: BC Managed Care – PPO | Admitting: Family Medicine

## 2014-06-11 VITALS — BP 110/70 | HR 97 | Temp 98.5°F | Resp 16 | Ht 74.0 in | Wt 234.0 lb

## 2014-06-11 DIAGNOSIS — E785 Hyperlipidemia, unspecified: Secondary | ICD-10-CM

## 2014-06-11 DIAGNOSIS — K219 Gastro-esophageal reflux disease without esophagitis: Secondary | ICD-10-CM

## 2014-06-11 DIAGNOSIS — Z0000 Encounter for general adult medical examination without abnormal findings: Secondary | ICD-10-CM

## 2014-06-11 DIAGNOSIS — F329 Major depressive disorder, single episode, unspecified: Secondary | ICD-10-CM

## 2014-06-11 DIAGNOSIS — M79605 Pain in left leg: Secondary | ICD-10-CM

## 2014-06-11 DIAGNOSIS — F32A Depression, unspecified: Secondary | ICD-10-CM

## 2014-06-11 DIAGNOSIS — M79662 Pain in left lower leg: Secondary | ICD-10-CM

## 2014-06-11 MED ORDER — DULOXETINE HCL 30 MG PO CPEP: 30.0000 mg | ORAL_CAPSULE | ORAL | Status: DC | PRN

## 2014-06-11 MED ORDER — PANTOPRAZOLE SODIUM 20 MG PO TBEC: 20.0000 mg | DELAYED_RELEASE_TABLET | Freq: Every day | ORAL | Status: DC

## 2014-06-11 NOTE — Patient Instructions (Signed)
Take Cymbalta 30 mg one daily  Take Protonix 1 daily  Return in 3-6 months for follow-up regarding the Cymbalta  Continue to try and get regular exercise. Work hard on eating less.  If depression is getting acutely worse at any time please come back and talk with me. If ever acutely worse go to the emergency room.

## 2014-06-11 NOTE — Progress Notes (Signed)
Physical examination:  History: 58 year old man previously known to me, formerly from [facility], who is here for a physical examination. He is doing satisfactorily though he has some problems. He has been struggling with some depression. His divorce is just getting final. His wife had decided that she wanted to have her husband and her boyfriend, and that did not work. He sought counseling, but she was not interested. He has shared custody of his children who are 15 and 9. He's been having problems with pain in the lateral aspect of the left calf for about 3 months.  Past medical history: Surgeries:: Left knee meniscus and torn labrum of shoulder 10-09-11 Medical illnesses: Arthritis, depression, GERD Regular medications: He is seeing a naturopathic physician who has him on thyroid and testosterone and DHEA. Other medications include glucosamine, ascorbic acid, aspirin, Lipitor, Celebrex when necessary, duloxetine which he has been out of, multivitamin, omega-3, pantoprazole which he has been out of, and when necessary Cialis which he is not using. Allergies: Prednisone and bee stings  Family history: Mother is 53 and in fair health. Father died in 10/09/2006. Social history: Does not smoke. Drinks occasional drink 3-5 times per week. Does not use drugs. He is getting divorced. Not sexually involved. 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 Has college education. Has his motorcycles but has not been riding them lately. Has shared custody of his children.  Review of systems: Constitutional: Unremarkable HEENT: Unremarkable Respiratory: Unremarkable Cardiovascular: Unremarkable Gastrointestinal: Unremarkable Genitourinary: Unremarkable Musculoskeletal: Has joint pain and back pain. Pain in the lateral aspect of his left calf for the past 3 months. Dermatologic: Unremarkable Allergy: Seasonal allergies Neurological: Unremarkable Hematologic: Unremarkable Psychiatric:  Unremarkable  Physical exam: Healthy-appearing man, appears depressed. TMs all. Eyes PERRLA. Throat clear. Neck supple without nodes or thyromegaly. No carotid bruits. Chest is clear to all station. Heart regular without murmurs gallops or arrhythmias. Normal male external genitalia with testes descended. No hernias. Extremities without edema. A small varicose vein visible when he stands on his lateral aspect of his left calf is just below the region that he is most tender. He is tender in the far lateral calf. That's been persisting for 3 months, not acute.  Preventive history: He sees urologist, it is been about a year since he had a prostate check, but we will let the urologist still notes that No recent EKGs. No bone density. Colonoscopy 09-Oct-2011.  Assessment: Annual physical examination Left calf pain Depression Marital dysfunction with impending divorce Arthralgias GERD  Plan: I don't think the supplements going to benefit him, especially the testosterone and thyroid but I will leave that up to Dr. Susa Day who is his naturopathic physician.  Ultrasound calf  Return in 4-6 months.  Placed back on the Protonix and Cymbalta 30.

## 2014-06-12 ENCOUNTER — Telehealth: Payer: Self-pay | Admitting: Family Medicine

## 2014-06-12 ENCOUNTER — Encounter: Payer: Self-pay | Admitting: Family Medicine

## 2014-06-12 NOTE — Telephone Encounter (Signed)
Patient dropped off lab results for Dr. Linna Darner on 06/12/2014. Placed in Hopper's box on same day.

## 2014-06-12 NOTE — Telephone Encounter (Signed)
For your information  

## 2014-06-12 NOTE — Telephone Encounter (Signed)
Reviewed and scanned reports.  High cholesterol is primary concern

## 2014-06-21 ENCOUNTER — Encounter: Payer: Self-pay | Admitting: Cardiology

## 2014-06-21 ENCOUNTER — Ambulatory Visit (INDEPENDENT_AMBULATORY_CARE_PROVIDER_SITE_OTHER): Payer: BLUE CROSS/BLUE SHIELD | Admitting: Cardiology

## 2014-06-21 VITALS — BP 140/60 | HR 80 | Ht 74.0 in | Wt 238.0 lb

## 2014-06-21 DIAGNOSIS — E785 Hyperlipidemia, unspecified: Secondary | ICD-10-CM

## 2014-06-21 DIAGNOSIS — Z9189 Other specified personal risk factors, not elsewhere classified: Secondary | ICD-10-CM

## 2014-06-21 NOTE — Patient Instructions (Signed)
Your physician wants you to follow-up in: 1 year with Dr McLean. (January 2017). You will receive a reminder letter in the mail two months in advance. If you don't receive a letter, please call our office to schedule the follow-up appointment.  

## 2014-06-23 NOTE — Progress Notes (Signed)
Patient ID: Phillip Hester, male   DOB: 07-19-1955, 59 y.o.   MRN: [id number] PCP: Dr. Ruben Reason  59 yo with history of OSA and hyperlipidemia presents for cardiology followup.  He has a history of atypical chest pain and had a normal ETT-myoview with good exercise tolerance in 2010.  No chest pain since that time.  He denies exertional dyspnea and does some jogging for exercise. He does not smoke. Blood pressure is high normal today and weight is up about 4 lbs.  He is using CPAP.  He remains under a lot of stress trying to finalize his divorce.  He is now on Cymbalta for depression.  ECG: NSR, LVH   Labs (2/12): K 4.9, creatinine 0.98, LDL 74, HDL 46 Labs (9/12): LDL 93, HDL 43 Labs (2/14): LDL particle number 2303 Labs (12/15): LDL 171, HDL 49, K 4.7, creatinine 1.01, HCT 51.1  PMH: 1. OSA: on CPAP 2. Hyperlipidemia 3. H/o basal cell carcinoma 4. GERD 5. Echo (6/10): EF 60%, mild LVH, aortic sclerosis without stenosis, normal diastolic function. 6. ETT-myoview (2010): 13' exercise, EF 76%, no evidence for ischemia or infarction.  7. Depression  SH: Nonsmoker.  2-3 beers/week.  Separated, with 2 daughters.  Owns a Pension scheme manager.   FH: Father with CVA at 10, HTN  Allergies: Bee stings  Current Outpatient Prescriptions  Medication Sig Dispense Refill  . ARMOUR THYROID 30 MG tablet Take 30 mg by mouth daily.  3  . aspirin EC 81 MG tablet Take 1 tablet (81 mg total) by mouth daily.    Marland Kitchen atorvastatin (LIPITOR) 20 MG tablet Take 1 tablet (20 mg total) by mouth daily. 90 tablet 3  . DULoxetine (CYMBALTA) 30 MG capsule Take 1 capsule (30 mg total) by mouth as needed. 30 capsule 5  . Multiple Vitamin (MULTIVITAMIN) tablet Take 1 tablet by mouth daily.      . Omega-3 Fatty Acids (FISH OIL) 1000 MG CAPS Take 4 capsules by mouth daily.      . vitamin C (ASCORBIC ACID) 500 MG tablet Take 500 mg by mouth daily.      . celecoxib (CELEBREX) 100 MG capsule Take 1 capsule  (100 mg total) by mouth as needed. (Patient not taking: Reported on 06/21/2014) 30 capsule 0  . CIALIS 5 MG tablet Take by mouth daily as needed.     . pantoprazole (PROTONIX) 20 MG tablet Take 1 tablet (20 mg total) by mouth daily. (Patient not taking: Reported on 06/21/2014) 30 tablet 5   No current facility-administered medications for this visit.    BP 140/60 mmHg  Pulse 80  Ht 6\' 2"  (1.88 m)  Wt 238 lb (107.956 kg)  BMI 30.54 kg/m2 General: NAD Neck: No JVD, no thyromegaly or thyroid nodule.  Lungs: Clear to auscultation bilaterally with normal respiratory effort. CV: Nondisplaced PMI.  Heart regular S1/S2, no S3/S4, no murmur.  No peripheral edema.  No carotid bruit.  Normal pedal pulses.  Abdomen: Soft, nontender, no hepatosplenomegaly, no distention.  Neurologic: Alert and oriented x 3.  Psych: Normal affect. Extremities: No clubbing or cyanosis.   Assessment/Plan:  HYPERLIPIDEMIA  Started statin recently.  To get lipids in a month or two with PCP, asked that he have copy sent over here.    Cardiovascular risk factor  Continue ASA 81 and statin.  Needs to work on weight loss.  BP currently borderline, would not treat yet.    Followup in 1 year.   Loralie Champagne 06/23/2014

## 2014-09-01 ENCOUNTER — Other Ambulatory Visit: Payer: Self-pay | Admitting: Cardiology

## 2015-03-24 DIAGNOSIS — R972 Elevated prostate specific antigen [PSA]: Secondary | ICD-10-CM | POA: Insufficient documentation

## 2015-05-30 DIAGNOSIS — Z8042 Family history of malignant neoplasm of prostate: Secondary | ICD-10-CM | POA: Insufficient documentation

## 2015-05-30 DIAGNOSIS — C672 Malignant neoplasm of lateral wall of bladder: Secondary | ICD-10-CM | POA: Insufficient documentation

## 2017-05-20 DIAGNOSIS — Z1211 Encounter for screening for malignant neoplasm of colon: Secondary | ICD-10-CM | POA: Insufficient documentation

## 2017-06-20 ENCOUNTER — Ambulatory Visit
Admission: RE | Admit: 2017-06-20 | Discharge: 2017-06-20 | Disposition: A | Discharge status: Home / Self Care | Payer: BLUE CROSS/BLUE SHIELD | Source: Ambulatory Visit | Attending: Internal Medicine | Admitting: Internal Medicine

## 2017-06-20 ENCOUNTER — Other Ambulatory Visit: Payer: Self-pay | Admitting: Internal Medicine

## 2017-06-20 DIAGNOSIS — G540 Brachial plexus disorders: Secondary | ICD-10-CM

## 2017-10-20 DIAGNOSIS — R7989 Other specified abnormal findings of blood chemistry: Secondary | ICD-10-CM | POA: Insufficient documentation

## 2017-10-20 DIAGNOSIS — E538 Deficiency of other specified B group vitamins: Secondary | ICD-10-CM | POA: Insufficient documentation

## 2017-10-20 DIAGNOSIS — E280 Estrogen excess: Secondary | ICD-10-CM | POA: Insufficient documentation

## 2018-08-13 DIAGNOSIS — R5383 Other fatigue: Secondary | ICD-10-CM | POA: Insufficient documentation

## 2018-08-29 DIAGNOSIS — K4090 Unilateral inguinal hernia, without obstruction or gangrene, not specified as recurrent: Secondary | ICD-10-CM | POA: Insufficient documentation

## 2019-05-08 ENCOUNTER — Other Ambulatory Visit: Payer: Self-pay | Admitting: Family Medicine

## 2019-05-08 ENCOUNTER — Ambulatory Visit
Admission: RE | Admit: 2019-05-08 | Discharge: 2019-05-08 | Disposition: A | Discharge status: Home / Self Care | Payer: BC Managed Care – PPO | Source: Ambulatory Visit | Attending: Family Medicine | Admitting: Family Medicine

## 2019-05-08 DIAGNOSIS — M25559 Pain in unspecified hip: Secondary | ICD-10-CM

## 2019-05-08 DIAGNOSIS — M545 Low back pain, unspecified: Secondary | ICD-10-CM

## 2019-07-18 ENCOUNTER — Ambulatory Visit: Payer: Self-pay

## 2019-07-18 ENCOUNTER — Other Ambulatory Visit: Payer: Self-pay

## 2019-07-18 ENCOUNTER — Ambulatory Visit (INDEPENDENT_AMBULATORY_CARE_PROVIDER_SITE_OTHER): Payer: BC Managed Care – PPO | Admitting: Orthopaedic Surgery

## 2019-07-18 ENCOUNTER — Encounter: Payer: Self-pay | Admitting: Orthopaedic Surgery

## 2019-07-18 DIAGNOSIS — M25551 Pain in right hip: Secondary | ICD-10-CM

## 2019-07-18 DIAGNOSIS — M1611 Unilateral primary osteoarthritis, right hip: Secondary | ICD-10-CM | POA: Diagnosis not present

## 2019-07-18 NOTE — Progress Notes (Signed)
Office Visit Note   Patient: Phillip Hester           Date of Birth: 29-Mar-1956           MRN: [id number] Visit Date: 07/18/2019              Requested by: No referring provider defined for this encounter. PCP: Patient, No Pcp Per   Assessment & Plan: Visit Diagnoses:  1. Pain in right hip   2. Unilateral primary osteoarthritis, right hip     Plan: I do feel that this is a hip issue in terms of arthritis with his hip.  I agree with the other orthopedic surgeon that this is more likely from arthritis in his hip and not from his previous hernia surgery.  We had a long and thorough discussion about this and I went over his x-rays.  I would like to have him be seen by Dr. Junius Roads for an ultrasound-guided right hip intra-articular steroid injection.  He agrees with this treatment plan.  All question concerns were answered addressed.  I would like to see him back in 4 weeks to see how he is doing overall.  At that visit I would like actually a standing low AP pelvis and a lateral of both hips.  Follow-Up Instructions: Return in about 4 weeks (around 08/15/2019).   Orders:  No orders of the defined types were placed in this encounter.  No orders of the defined types were placed in this encounter.     Procedures: No procedures performed   Clinical Data: No additional findings.   Subjective: Chief Complaint  Patient presents with  . Right Hip - Pain  Patient is a very pleasant and active 64 year old gentleman who comes in for second opinion as it relates to his right hip.  He has been dealing with right hip pain for I believe over a year now.  Its been in the groin on the right side.  He does have a history of a hernia repair on the right side that was done just in May 2020.  He does hurt with pivoting activities on the right side.  This is in the groin.  It does not hurt on the side of his right hip.  He has more difficulty with putting his shoes and socks on the right side and  crossing his leg on the right side.  He denies any specific injuries.  He has seen another orthopedic surgeon in town who does feel that this is a hip issue.  He has plain films that the radiologist dictated shows mild arthritis of the right hip.  He is here today for mainly second opinion.  HPI  Review of Systems He currently denies any headache, chest pain, shortness of breath, fever, chills, nausea, vomiting  Objective: Vital Signs: There were no vitals taken for this visit.  Physical Exam He is alert and orient x3 and in no acute distress Ortho Exam Examination of his left hip is normal examination of his right hip does show pain on extremes of internal extra rotation and itself in the groin.  There is no pain to palpation of the trochanteric area.  There is no leg length discrepancy. Specialty Comments:  No specialty comments available.  Imaging: No results found. An AP and lateral the right hip are seen on the canopy system.  That does not show the left hip.  The right hip does show narrowing of the superior lateral joint aspect of the hip joint.  There are some cystic changes in the femoral head and acetabulum and a para-articular osteophyte.  PMFS History: Patient Active Problem List   Diagnosis Date Noted  . Unilateral primary osteoarthritis, right hip 07/18/2019  . Cardiovascular risk factor 12/13/2010  . Hyperlipidemia 10/18/2008  . OBSTRUCTIVE SLEEP APNEA 10/18/2008  . SNORING 10/18/2008   Past Medical History:  Diagnosis Date  . Hyperlipidemia   . Pure hypercholesterolemia     Family History  Problem Relation Age of Onset  . Arthritis Mother   . Dementia Father   . Cancer Maternal Grandfather   . Cancer Paternal Grandfather     Past Surgical History:  Procedure Laterality Date  . IRRIGATION AND DEBRIDEMENT SEBACEOUS CYST    . KNEE SURGERY Left 2013  . SHOULDER SURGERY Right 2013  . WISDOM TOOTH EXTRACTION     Social History   Occupational History  .  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[employer]  Tobacco Use  . Smoking status: Never Smoker  . Smokeless tobacco: Never Used  Substance and Sexual Activity  . Alcohol use: Yes    Alcohol/week: 2.0 - 3.0 standard drinks    Types: 2 - 3 drink(s) per week    Comment: occasional  . Drug use: No  . Sexual activity: Yes

## 2019-07-18 NOTE — Progress Notes (Signed)
Subjective: Patient is here for ultrasound-guided intra-articular right hip injection.   Chronic groin pain with DJD on x-ray.  Objective:  Pain and decreased ROM with IR.  Procedure: Ultrasound-guided right hip injection: After sterile prep with Betadine, injected 8 cc 1% lidocaine without epinephrine and 40 mg methylprednisolone using a 22-gauge spinal needle, passing the needle through the iliofemoral ligament into the femoral head/neck junction.  injectate seen filling joint capsule.  Modest immediate improvement.

## 2019-08-15 ENCOUNTER — Other Ambulatory Visit: Payer: Self-pay

## 2019-08-15 ENCOUNTER — Ambulatory Visit: Payer: Self-pay

## 2019-08-15 ENCOUNTER — Ambulatory Visit (INDEPENDENT_AMBULATORY_CARE_PROVIDER_SITE_OTHER): Payer: BC Managed Care – PPO | Admitting: Orthopaedic Surgery

## 2019-08-15 DIAGNOSIS — M1612 Unilateral primary osteoarthritis, left hip: Secondary | ICD-10-CM | POA: Diagnosis not present

## 2019-08-15 DIAGNOSIS — M1611 Unilateral primary osteoarthritis, right hip: Secondary | ICD-10-CM | POA: Diagnosis not present

## 2019-08-15 NOTE — Progress Notes (Signed)
The patient is returning after having an intra-articular steroid injection in his right hip joint by Dr. Junius Roads.  This was done under ultrasound.  The patient reports only minimal relief from that injection.  He still has pain in the groin on the right side.  He still has stiffness in his right hip and has more difficulty crossing his leg on the right side as well as putting his shoes and socks on the right side.  He is asymptomatic on the left side.  He is also had a history of hernia surgery on the right side and wonders if it was from that.  On exam his right and left hips move the same of the right hip definitely has pain in the groin or internal and external rotation.  It is only slightly more stiff than his left side.  X-rays of the pelvis and right hip do show superior lateral joint space narrowing on the right side.  There are periarticular osteophytes and cystic change in the femoral head.  At this point we would like to obtain an MRI of the right hip to assess the true degree of cartilage loss so we can make a better plan in terms of whether or not we would recommend hip replacement surgery.  I did give him a handout about anterior hip placement surgery.  If we pursue that route we will go over in more detail showing a hip model and what the implants are like.  The MRI is done to provide much better information for [place].  All question concerns were answered addressed.  We will see him back after the MRI is obtained.

## 2019-08-16 ENCOUNTER — Other Ambulatory Visit: Payer: Self-pay

## 2019-08-16 DIAGNOSIS — M1611 Unilateral primary osteoarthritis, right hip: Secondary | ICD-10-CM

## 2019-08-31 ENCOUNTER — Telehealth: Payer: Self-pay | Admitting: Orthopaedic Surgery

## 2019-08-31 NOTE — Telephone Encounter (Signed)
Error

## 2019-09-24 ENCOUNTER — Ambulatory Visit
Admission: RE | Admit: 2019-09-24 | Discharge: 2019-09-24 | Disposition: A | Discharge status: Home / Self Care | Payer: BC Managed Care – PPO | Source: Ambulatory Visit | Attending: Orthopaedic Surgery | Admitting: Orthopaedic Surgery

## 2019-09-24 ENCOUNTER — Other Ambulatory Visit: Payer: Self-pay

## 2019-09-24 DIAGNOSIS — M1611 Unilateral primary osteoarthritis, right hip: Secondary | ICD-10-CM

## 2019-10-01 ENCOUNTER — Ambulatory Visit (INDEPENDENT_AMBULATORY_CARE_PROVIDER_SITE_OTHER): Payer: BC Managed Care – PPO | Admitting: Orthopaedic Surgery

## 2019-10-01 ENCOUNTER — Other Ambulatory Visit: Payer: Self-pay

## 2019-10-01 ENCOUNTER — Encounter: Payer: Self-pay | Admitting: Orthopaedic Surgery

## 2019-10-01 DIAGNOSIS — M1611 Unilateral primary osteoarthritis, right hip: Secondary | ICD-10-CM | POA: Diagnosis not present

## 2019-10-01 MED ORDER — TRAMADOL HCL 50 MG PO TABS: 50.0000 mg | ORAL_TABLET | Freq: Four times a day (QID) | ORAL | 0 refills | Status: DC | PRN

## 2019-10-01 MED ORDER — NABUMETONE 750 MG PO TABS: 750.0000 mg | ORAL_TABLET | Freq: Two times a day (BID) | ORAL | 1 refills | Status: DC | PRN

## 2019-10-01 NOTE — Progress Notes (Signed)
Office Visit Note   Patient: Phillip Hester           Date of Birth: Aug 09, 1955           MRN: [id number] Visit Date: 10/01/2019              Requested by: No referring provider defined for this encounter. PCP: Patient, No Pcp Per   Assessment & Plan: Visit Diagnoses:  1. Unilateral primary osteoarthritis, right hip     Plan: Given the severity of his pain as well as the very conservative treatment combined with x-rays and MRI showing right hip arthritis, we are recommending a total hip arthroplasty.  I explained in detail what this type of surgery involves.  I gave him a handout about it.  We went over his x-ray findings and a hip model describing what the surgery involves in detail.  We talked about his interoperative and postoperative course and the risk and benefits of surgery.  I will have him stop his meloxicam and try a stronger dose of Relafen is we will do some tramadol.  All questions and concerns were answered and addressed.  He was said he will think about this and I gave him our surgery scheduler's card because he can work on making arrangements of how and when to proceed with surgery from a logistic standpoint from his job.  Follow-Up Instructions: Return for 2 weeks post-op.   Orders:  No orders of the defined types were placed in this encounter.  Meds ordered this encounter  Medications  . traMADol (ULTRAM) 50 MG tablet    Sig: Take 1-2 tablets (50-100 mg total) by mouth every 6 (six) hours as needed.    Dispense:  40 tablet    Refill:  0  . nabumetone (RELAFEN) 750 MG tablet    Sig: Take 1 tablet (750 mg total) by mouth 2 (two) times daily as needed.    Dispense:  60 tablet    Refill:  1      Procedures: No procedures performed   Clinical Data: No additional findings.   Subjective: Chief Complaint  Patient presents with  . Right Hip - Follow-up  The patient returns for follow-up after having a MRI of his right hip.  His plain films do show moderate  arthritis of his hip.  Now his MRI correlates with this and does show moderate arthritis of his right hip.  His right hip pain is daily and it is getting worse.  Is becoming more of a constant type of pain.  At this point he can be 10 out of 10.  It is detrimentally affecting his actives daily living, his quality of life and his mobility.  He has been dealing with this for getting close to a year now.  He has had hernia surgery as well on that right side.  He points to his groin as well as the lateral aspect of his right hip as a source of his pain.  HPI  Review of Systems He currently denies any headache, chest pain, shortness of breath, fever, chills, nausea, vomiting  Objective: Vital Signs: There were no vitals taken for this visit.  Physical Exam He is alert and orient x3 and in no acute distress Ortho Exam Examination of his right hip shows pain in the groin with internal and external rotation.  There is stiffness with rotation as well. Specialty Comments:  No specialty comments available.  Imaging: No results found. I did go over the plain  films of his right hip with him again today as well as the MRI.  Both of them show significant superior lateral joint space narrowing.  There is edematous changes in the weightbearing surface of the hip at the femoral head and acetabulum.  There is reactive edema.  There is also degenerative changes and degenerative tearing of the labrum at the weightbearing aspect.  PMFS History: Patient Active Problem List   Diagnosis Date Noted  . Unilateral primary osteoarthritis, right hip 07/18/2019  . Cardiovascular risk factor 12/13/2010  . Hyperlipidemia 10/18/2008  . OBSTRUCTIVE SLEEP APNEA 10/18/2008  . SNORING 10/18/2008   Past Medical History:  Diagnosis Date  . Hyperlipidemia   . Pure hypercholesterolemia     Family History  Problem Relation Age of Onset  . Arthritis Mother   . Dementia Father   . Cancer Maternal Grandfather   . Cancer  Paternal Grandfather     Past Surgical History:  Procedure Laterality Date  . IRRIGATION AND DEBRIDEMENT SEBACEOUS CYST    . KNEE SURGERY Left 2013  . SHOULDER SURGERY Right 2013  . WISDOM TOOTH EXTRACTION     Social History   Occupational History  . 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[employer]  Tobacco Use  . Smoking status: Never Smoker  . Smokeless tobacco: Never Used  Substance and Sexual Activity  . Alcohol use: Yes    Alcohol/week: 2.0 - 3.0 standard drinks    Types: 2 - 3 drink(s) per week    Comment: occasional  . Drug use: No  . Sexual activity: Yes

## 2019-10-15 ENCOUNTER — Other Ambulatory Visit: Payer: Self-pay

## 2019-10-30 ENCOUNTER — Other Ambulatory Visit: Payer: Self-pay | Admitting: Orthopaedic Surgery

## 2019-10-31 NOTE — Telephone Encounter (Signed)
Please advise 

## 2019-11-07 ENCOUNTER — Other Ambulatory Visit: Payer: Self-pay | Admitting: Physician Assistant

## 2019-11-15 ENCOUNTER — Encounter (HOSPITAL_COMMUNITY): Payer: Self-pay

## 2019-11-15 NOTE — Pre-Procedure Instructions (Signed)
Your procedure is scheduled on Tuesday November 20 2019, from 12:00 PM- 1:33 PM.  Report to [facility] Main Entrance "A" at 10:00 A.M., and check in at the Admitting office.  Call this number if you have problems the morning of surgery:  [phone]  Call [phone] if you have any questions prior to your surgery date Monday-Friday 8am-4pm.    Remember:  Do not eat after midnight the night before your surgery.  You may drink clear liquids until 09:00 AM the morning of your surgery.   Clear liquids allowed are: Water, Non-Citrus Juices (without pulp), Carbonated Beverages, Clear Tea, Black Coffee Only, and Gatorade.   Enhanced Recovery after Surgery for Orthopedics Enhanced Recovery after Surgery is a protocol used to improve the stress on your body and your recovery after surgery.  . The day of surgery (if you do NOT have diabetes):  o Drink ONE (1) Pre-Surgery Clear Ensure by 09:00 AM.   o This drink was given to you during your hospital  pre-op appointment visit. o Nothing else to drink after completing the  Pre-Surgery Clear Ensure.         If you have questions, please contact your surgeon's office.     Take these medicines the morning of surgery with A SIP OF WATER: levothyroxine (SYNTHROID)  IF NEEDED: omeprazole (PRILOSEC) traMADol (ULTRAM)   *Follow your surgeon's instructions on when to stop Aspirin.  If no instructions were given by your surgeon then you will need to call the office to get those instructions.    As of today, STOP taking any nabumetone (RELAFEN),  Aleve, Naproxen, Ibuprofen, Motrin, Advil, Goody's, BC's, all herbal medications, fish oil, and all vitamins.          The Morning of Surgery:            Do not wear jewelry.            Do not wear lotions, powders, colognes, or deodorant.            Men may shave face and neck.            Do not bring valuables to the hospital.            [facility] is not responsible for any belongings or  valuables.  Do NOT Smoke (Tobacco/Vapping) or drink Alcohol 24 hours prior to your procedure.  If you use a CPAP at night, you may bring all equipment for your overnight stay.   Contacts, glasses, dentures or bridgework may not be worn into surgery.      For patients admitted to the hospital, discharge time will be determined by your treatment team.   Patients discharged the day of surgery will not be allowed to drive home, and someone needs to stay with them for 24 hours.    Special instructions:   [place]- Preparing For Surgery  Before surgery, you can play an important role. Because skin is not sterile, your skin needs to be as free of germs as possible. You can reduce the number of germs on your skin by washing with CHG (chlorahexidine gluconate) Soap before surgery.  CHG is an antiseptic cleaner which kills germs and bonds with the skin to continue killing germs even after washing.    Oral Hygiene is also important to reduce your risk of infection.  Remember - BRUSH YOUR TEETH THE MORNING OF SURGERY WITH YOUR REGULAR TOOTHPASTE  Please do not use if you have an allergy to  CHG or antibacterial soaps. If your skin becomes reddened/irritated stop using the CHG.  Do not shave (including legs and underarms) for at least 48 hours prior to first CHG shower. It is OK to shave your face.  Please follow these instructions carefully.   1. Shower the NIGHT BEFORE SURGERY and the MORNING OF SURGERY with CHG Soap.   2. If you chose to wash your hair, wash your hair first as usual with your normal shampoo.  3. After you shampoo, rinse your hair and body thoroughly to remove the shampoo.  4. Use CHG as you would any other liquid soap. You can apply CHG directly to the skin and wash gently with a scrungie or a clean washcloth.   5. Apply the CHG Soap to your body ONLY FROM THE NECK DOWN.  Do not use on open wounds or open sores. Avoid contact with your eyes, ears, mouth and genitals  (private parts). Wash Face and genitals (private parts)  with your normal soap.   6. Wash thoroughly, paying special attention to the area where your surgery will be performed.  7. Thoroughly rinse your body with warm water from the neck down.  8. DO NOT shower/wash with your normal soap after using and rinsing off the CHG Soap.  9. Pat yourself dry with a CLEAN TOWEL.  10. Wear CLEAN PAJAMAS to bed the night before surgery, wear comfortable clothes the morning of surgery  11. Place CLEAN SHEETS on your bed the night of your first shower and DO NOT SLEEP WITH PETS.   Day of Surgery: Shower with CHG Soap.  Do not apply any deodorants/lotions.  Please wear clean clothes to the hospital/surgery center.   Remember to brush your teeth WITH YOUR REGULAR TOOTHPASTE.   Please read over the following fact sheets that you were given.

## 2019-11-16 ENCOUNTER — Encounter (HOSPITAL_COMMUNITY): Payer: Self-pay

## 2019-11-16 ENCOUNTER — Other Ambulatory Visit (HOSPITAL_COMMUNITY)
Admission: RE | Admit: 2019-11-16 | Discharge: 2019-11-16 | Disposition: A | Discharge status: Home / Self Care | Payer: BC Managed Care – PPO | Source: Ambulatory Visit | Attending: Orthopaedic Surgery | Admitting: Orthopaedic Surgery

## 2019-11-16 ENCOUNTER — Encounter (HOSPITAL_COMMUNITY)
Admission: RE | Admit: 2019-11-16 | Discharge: 2019-11-16 | Disposition: A | Discharge status: Home / Self Care | Payer: BC Managed Care – PPO | Source: Ambulatory Visit | Attending: Orthopaedic Surgery | Admitting: Orthopaedic Surgery

## 2019-11-16 ENCOUNTER — Other Ambulatory Visit: Payer: Self-pay

## 2019-11-16 DIAGNOSIS — Z20822 Contact with and (suspected) exposure to covid-19: Secondary | ICD-10-CM | POA: Insufficient documentation

## 2019-11-16 DIAGNOSIS — Z01818 Encounter for other preprocedural examination: Secondary | ICD-10-CM | POA: Diagnosis present

## 2019-11-16 HISTORY — DX: Personal history of urinary calculi: Z87.442

## 2019-11-16 HISTORY — DX: Unspecified osteoarthritis, unspecified site: M19.90

## 2019-11-16 HISTORY — DX: Cardiac murmur, unspecified: R01.1

## 2019-11-16 HISTORY — DX: Gastro-esophageal reflux disease without esophagitis: K21.9

## 2019-11-16 HISTORY — DX: Malignant (primary) neoplasm, unspecified: C80.1

## 2019-11-16 HISTORY — DX: Sleep apnea, unspecified: G47.30

## 2019-11-16 LAB — COMPREHENSIVE METABOLIC PANEL
ALT: 34 U/L (ref 0–44)
AST: 31 U/L (ref 15–41)
Albumin: 4.1 g/dL (ref 3.5–5.0)
Alkaline Phosphatase: 51 U/L (ref 38–126)
Anion gap: 9 (ref 5–15)
BUN: 17 mg/dL (ref 8–23)
CO2: 26 mmol/L (ref 22–32)
Calcium: 9.3 mg/dL (ref 8.9–10.3)
Chloride: 106 mmol/L (ref 98–111)
Creatinine, Ser: 1.16 mg/dL (ref 0.61–1.24)
GFR calc Af Amer: >60 mL/min (ref >60)
GFR calc non Af Amer: >60 mL/min (ref >60)
Glucose, Bld: 117 mg/dL — ABNORMAL HIGH (ref 70–99)
Potassium: 4.4 mmol/L (ref 3.5–5.1)
Sodium: 141 mmol/L (ref 135–145)
Total Bilirubin: 0.8 mg/dL (ref 0.3–1.2)
Total Protein: 6.8 g/dL (ref 6.5–8.1)

## 2019-11-16 LAB — CBC
HCT: 56.9 % — ABNORMAL HIGH (ref 39.0–52.0)
Hemoglobin: 18.1 g/dL — ABNORMAL HIGH (ref 13.0–17.0)
MCH: 29.8 pg (ref 26.0–34.0)
MCHC: 31.8 g/dL (ref 30.0–36.0)
MCV: 93.7 fL (ref 80.0–100.0)
Platelets: 223 10*3/uL (ref 150–400)
RBC: 6.07 MIL/uL — ABNORMAL HIGH (ref 4.22–5.81)
RDW: 13.6 % (ref 11.5–15.5)
WBC: 6.5 10*3/uL (ref 4.0–10.5)
nRBC: 0 % (ref 0.0–0.2)

## 2019-11-16 LAB — SURGICAL PCR SCREEN
MRSA, PCR: NEGATIVE
Staphylococcus aureus: NEGATIVE

## 2019-11-16 LAB — TYPE AND SCREEN
ABO/RH(D): A POS
Antibody Screen: NEGATIVE

## 2019-11-16 LAB — ABO/RH: ABO/RH(D): A POS

## 2019-11-16 NOTE — Progress Notes (Signed)
PCP - Karle Starch, MD at [facility] - Per patient, saw one a few years ago for chest pain but did not have to follow up nor does he see one regularly.  PPM/ICD - Denies  Chest x-ray - N/A EKG - N/A Stress Test - Per patient, > 10 years ago, results were negative. ECHO - 11/14/08 Cardiac Cath - Denies  Sleep Study - Yes CPAP - Yes  Patient denies being a diabetic.  Blood Thinner Instructions: N/A Aspirin Instructions: Per patient, last dose today, 11/16/19  ERAS Protcol - Yes PRE-SURGERY Ensure or G2- Ensure given  COVID TEST- 11/16/19 @ [facility]   Anesthesia review: Yes, review Echo; Last PCP office note requested.  Patient denies shortness of breath, fever, cough and chest pain at PAT appointment   All instructions explained to the patient, with a verbal understanding of the material. Patient agrees to go over the instructions while at home for a better understanding. Patient also instructed to self quarantine after being tested for COVID-19. The opportunity to ask questions was provided.

## 2019-11-17 LAB — SARS CORONAVIRUS 2 (TAT 6-24 HRS): SARS Coronavirus 2: NEGATIVE

## 2019-11-19 NOTE — Progress Notes (Signed)
Anesthesia Chart Review:  Remote cardiac eval for what was ultimately determine to be atypical chest pain. Last seen by Dr. Aundra Dubin 06/23/14, per note, " He has a history of atypical chest pain and had a normal ETT-myoview with good exercise tolerance in 2010.  No chest pain since that time.  He denies exertional dyspnea and does some jogging for exercise."  OSA on CPAP.  Preop labs reviewed, unremarkable.   Echo (6/10): EF 60%, mild LVH, aortic sclerosis without stenosis, normal diastolic function.  ETT-myoview (2010): 13' exercise, EF 76%, no evidence for ischemia or infarction.    Wynonia Musty [facility] Stay [facility]/Anesthesiology Phone [phone] 11/19/2019 10:07 AM

## 2019-11-19 NOTE — Anesthesia Preprocedure Evaluation (Addendum)
Anesthesia Evaluation  Patient identified by MRN, date of birth, ID band Patient awake    Reviewed: Allergy & Precautions, NPO status , Patient's Chart, lab work & pertinent test results  Airway Mallampati: I       Dental no notable dental hx. (+) Teeth Intact   Pulmonary sleep apnea and Continuous Positive Airway Pressure Ventilation ,    Pulmonary exam normal breath sounds clear to auscultation       Cardiovascular negative cardio ROS Normal cardiovascular exam Rhythm:Regular Rate:Normal     Neuro/Psych negative neurological ROS  negative psych ROS   GI/Hepatic GERD  Medicated and Controlled,  Endo/Other  Hypothyroidism   Renal/GU  Bladder dysfunction      Musculoskeletal   Abdominal (+) + obese,   Peds  Hematology negative hematology ROS (+)   Anesthesia Other Findings   Reproductive/Obstetrics                             Anesthesia Physical Anesthesia Plan  ASA: II  Anesthesia Plan: Spinal   Post-op Pain Management:    Induction:   PONV Risk Score and Plan: 2 and Ondansetron, Dexamethasone, Midazolam and TIVA  Airway Management Planned: Natural Airway and Mask  Additional Equipment: None  Intra-op Plan:   Post-operative Plan:   Informed Consent: I have reviewed the patients History and Physical, chart, labs and discussed the procedure including the risks, benefits and alternatives for the proposed anesthesia with the patient or authorized representative who has indicated his/her understanding and acceptance.       Plan Discussed with: CRNA  Anesthesia Plan Comments: (PAT note by Karoline Caldwell, PA-C: Remote cardiac eval for what was ultimately determine to be atypical chest pain. Last seen by Dr. Aundra Dubin 06/23/14, per note, " He has a history of atypical chest pain and had a normal ETT-myoview with good exercise tolerance in 2010.  No chest pain since that time.  He  denies exertional dyspnea and does some jogging for exercise."  OSA on CPAP.  Preop labs reviewed, unremarkable.   Echo (6/10): EF 60%, mild LVH, aortic sclerosis without stenosis, normal diastolic function.  ETT-myoview (2010): 13' exercise, EF 76%, no evidence for ischemia or infarction.  )       Anesthesia Quick Evaluation

## 2019-11-20 ENCOUNTER — Encounter (HOSPITAL_COMMUNITY)
Admission: RE | Disposition: A | Discharge status: Home / Self Care | Payer: Self-pay | Source: Home / Self Care | Attending: Orthopaedic Surgery

## 2019-11-20 ENCOUNTER — Ambulatory Visit (HOSPITAL_COMMUNITY): Payer: BC Managed Care – PPO

## 2019-11-20 ENCOUNTER — Observation Stay (HOSPITAL_COMMUNITY): Payer: BC Managed Care – PPO

## 2019-11-20 ENCOUNTER — Encounter (HOSPITAL_COMMUNITY): Payer: Self-pay | Admitting: Orthopaedic Surgery

## 2019-11-20 ENCOUNTER — Ambulatory Visit (HOSPITAL_COMMUNITY): Payer: BC Managed Care – PPO | Admitting: Physician Assistant

## 2019-11-20 ENCOUNTER — Other Ambulatory Visit: Payer: Self-pay

## 2019-11-20 ENCOUNTER — Observation Stay (HOSPITAL_COMMUNITY)
Admission: RE | Admit: 2019-11-20 | Discharge: 2019-11-21 | Disposition: A | Discharge status: Home / Self Care | Payer: BC Managed Care – PPO | Attending: Orthopaedic Surgery | Admitting: Orthopaedic Surgery

## 2019-11-20 ENCOUNTER — Ambulatory Visit (HOSPITAL_COMMUNITY): Payer: BC Managed Care – PPO | Admitting: Certified Registered"

## 2019-11-20 DIAGNOSIS — Z91030 Bee allergy status: Secondary | ICD-10-CM | POA: Insufficient documentation

## 2019-11-20 DIAGNOSIS — N319 Neuromuscular dysfunction of bladder, unspecified: Secondary | ICD-10-CM | POA: Diagnosis not present

## 2019-11-20 DIAGNOSIS — Z7982 Long term (current) use of aspirin: Secondary | ICD-10-CM | POA: Diagnosis not present

## 2019-11-20 DIAGNOSIS — K219 Gastro-esophageal reflux disease without esophagitis: Secondary | ICD-10-CM | POA: Diagnosis not present

## 2019-11-20 DIAGNOSIS — E785 Hyperlipidemia, unspecified: Secondary | ICD-10-CM | POA: Diagnosis not present

## 2019-11-20 DIAGNOSIS — Z8261 Family history of arthritis: Secondary | ICD-10-CM | POA: Diagnosis not present

## 2019-11-20 DIAGNOSIS — Z79899 Other long term (current) drug therapy: Secondary | ICD-10-CM | POA: Diagnosis not present

## 2019-11-20 DIAGNOSIS — Z820 Family history of epilepsy and other diseases of the nervous system: Secondary | ICD-10-CM | POA: Insufficient documentation

## 2019-11-20 DIAGNOSIS — R011 Cardiac murmur, unspecified: Secondary | ICD-10-CM | POA: Insufficient documentation

## 2019-11-20 DIAGNOSIS — Z809 Family history of malignant neoplasm, unspecified: Secondary | ICD-10-CM | POA: Diagnosis not present

## 2019-11-20 DIAGNOSIS — Z888 Allergy status to other drugs, medicaments and biological substances status: Secondary | ICD-10-CM | POA: Diagnosis not present

## 2019-11-20 DIAGNOSIS — Z419 Encounter for procedure for purposes other than remedying health state, unspecified: Secondary | ICD-10-CM

## 2019-11-20 DIAGNOSIS — E669 Obesity, unspecified: Secondary | ICD-10-CM | POA: Diagnosis not present

## 2019-11-20 DIAGNOSIS — E7800 Pure hypercholesterolemia, unspecified: Secondary | ICD-10-CM | POA: Diagnosis not present

## 2019-11-20 DIAGNOSIS — Z87442 Personal history of urinary calculi: Secondary | ICD-10-CM | POA: Diagnosis not present

## 2019-11-20 DIAGNOSIS — E039 Hypothyroidism, unspecified: Secondary | ICD-10-CM | POA: Insufficient documentation

## 2019-11-20 DIAGNOSIS — Z8551 Personal history of malignant neoplasm of bladder: Secondary | ICD-10-CM | POA: Diagnosis not present

## 2019-11-20 DIAGNOSIS — Z96641 Presence of right artificial hip joint: Secondary | ICD-10-CM

## 2019-11-20 DIAGNOSIS — M1611 Unilateral primary osteoarthritis, right hip: Principal | ICD-10-CM | POA: Insufficient documentation

## 2019-11-20 DIAGNOSIS — G4733 Obstructive sleep apnea (adult) (pediatric): Secondary | ICD-10-CM | POA: Diagnosis not present

## 2019-11-20 DIAGNOSIS — Z791 Long term (current) use of non-steroidal anti-inflammatories (NSAID): Secondary | ICD-10-CM | POA: Insufficient documentation

## 2019-11-20 DIAGNOSIS — Z6832 Body mass index (BMI) 32.0-32.9, adult: Secondary | ICD-10-CM | POA: Insufficient documentation

## 2019-11-20 HISTORY — PX: TOTAL HIP ARTHROPLASTY: SHX124

## 2019-11-20 SURGERY — ARTHROPLASTY, HIP, TOTAL, ANTERIOR APPROACH
Anesthesia: Spinal | Site: Hip | Laterality: Right

## 2019-11-20 MED ORDER — PROPOFOL 500 MG/50ML IV EMUL
INTRAVENOUS | Status: DC | PRN
  Administered 2019-11-20: 150 ug/kg/min via INTRAVENOUS

## 2019-11-20 MED ORDER — PROPOFOL 10 MG/ML IV BOLUS
INTRAVENOUS | Status: AC
  Filled 2019-11-20: qty 20

## 2019-11-20 MED ORDER — LACTATED RINGERS IV SOLN: INTRAVENOUS | Status: DC | PRN

## 2019-11-20 MED ORDER — OXYCODONE HCL 5 MG PO TABS
5.0000 mg | ORAL_TABLET | ORAL | Status: DC | PRN
  Administered 2019-11-20 – 2019-11-21 (×2): 10 mg via ORAL
  Filled 2019-11-20 (×3): qty 2

## 2019-11-20 MED ORDER — MEPERIDINE HCL 25 MG/ML IJ SOLN: 6.2500 mg | INTRAMUSCULAR | Status: DC | PRN

## 2019-11-20 MED ORDER — DOCUSATE SODIUM 100 MG PO CAPS
100.0000 mg | ORAL_CAPSULE | Freq: Two times a day (BID) | ORAL | Status: DC
  Administered 2019-11-20 – 2019-11-21 (×2): 100 mg via ORAL
  Filled 2019-11-20 (×2): qty 1

## 2019-11-20 MED ORDER — PANTOPRAZOLE SODIUM 40 MG PO TBEC
40.0000 mg | DELAYED_RELEASE_TABLET | Freq: Every day | ORAL | Status: DC
  Administered 2019-11-21: 40 mg via ORAL
  Filled 2019-11-20: qty 1

## 2019-11-20 MED ORDER — BUPIVACAINE IN DEXTROSE 0.75-8.25 % IT SOLN
INTRATHECAL | Status: DC | PRN
  Administered 2019-11-20: 1.8 mL via INTRATHECAL

## 2019-11-20 MED ORDER — SODIUM CHLORIDE 0.9 % IR SOLN
Status: DC | PRN
  Administered 2019-11-20: 3000 mL

## 2019-11-20 MED ORDER — OXYCODONE HCL 5 MG PO TABS
10.0000 mg | ORAL_TABLET | ORAL | Status: DC | PRN
  Administered 2019-11-20 – 2019-11-21 (×3): 10 mg via ORAL
  Filled 2019-11-20 (×2): qty 2

## 2019-11-20 MED ORDER — HYDROMORPHONE HCL 1 MG/ML IJ SOLN
0.2500 mg | INTRAMUSCULAR | Status: DC | PRN
  Administered 2019-11-20 (×2): 0.5 mg via INTRAVENOUS

## 2019-11-20 MED ORDER — POVIDONE-IODINE 10 % EX SWAB: 2.0000 "application " | Freq: Once | CUTANEOUS | Status: DC

## 2019-11-20 MED ORDER — MIDAZOLAM HCL 2 MG/2ML IJ SOLN
INTRAMUSCULAR | Status: AC
  Filled 2019-11-20: qty 2

## 2019-11-20 MED ORDER — TRANEXAMIC ACID-NACL 1000-0.7 MG/100ML-% IV SOLN
1000.0000 mg | INTRAVENOUS | Status: AC
  Administered 2019-11-20: 1000 mg via INTRAVENOUS
  Filled 2019-11-20: qty 100

## 2019-11-20 MED ORDER — CEFAZOLIN SODIUM-DEXTROSE 2-4 GM/100ML-% IV SOLN
2.0000 g | INTRAVENOUS | Status: AC
  Administered 2019-11-20: 2 g via INTRAVENOUS
  Filled 2019-11-20: qty 100

## 2019-11-20 MED ORDER — CHLORHEXIDINE GLUCONATE 0.12 % MT SOLN
15.0000 mL | Freq: Once | OROMUCOSAL | Status: AC
  Administered 2019-11-20: 15 mL via OROMUCOSAL
  Filled 2019-11-20: qty 15

## 2019-11-20 MED ORDER — ORAL CARE MOUTH RINSE: 15.0000 mL | Freq: Once | OROMUCOSAL | Status: AC

## 2019-11-20 MED ORDER — ASCORBIC ACID 500 MG PO TABS
3000.0000 mg | ORAL_TABLET | Freq: Every day | ORAL | Status: DC
  Administered 2019-11-21: 3000 mg via ORAL
  Filled 2019-11-20: qty 6

## 2019-11-20 MED ORDER — HYDROMORPHONE HCL 1 MG/ML IJ SOLN
0.5000 mg | INTRAMUSCULAR | Status: DC | PRN
  Administered 2019-11-20: 1 mg via INTRAVENOUS
  Filled 2019-11-20: qty 1

## 2019-11-20 MED ORDER — B COMPLEX PO TABS: 2.0000 | ORAL_TABLET | Freq: Every day | ORAL | Status: DC

## 2019-11-20 MED ORDER — METOCLOPRAMIDE HCL 5 MG PO TABS: 5.0000 mg | ORAL_TABLET | Freq: Three times a day (TID) | ORAL | Status: DC | PRN

## 2019-11-20 MED ORDER — SODIUM CHLORIDE 0.9 % IV SOLN: INTRAVENOUS | Status: DC

## 2019-11-20 MED ORDER — PHENOL 1.4 % MT LIQD: 1.0000 | OROMUCOSAL | Status: DC | PRN

## 2019-11-20 MED ORDER — CEFAZOLIN SODIUM-DEXTROSE 1-4 GM/50ML-% IV SOLN
1.0000 g | Freq: Four times a day (QID) | INTRAVENOUS | Status: AC
  Administered 2019-11-20 (×2): 1 g via INTRAVENOUS
  Filled 2019-11-20 (×2): qty 50

## 2019-11-20 MED ORDER — CEFAZOLIN SODIUM-DEXTROSE 2-4 GM/100ML-% IV SOLN: 2.0000 g | INTRAVENOUS | Status: DC

## 2019-11-20 MED ORDER — ZINC GLUCONATE 50 MG PO TABS: 100.0000 mg | ORAL_TABLET | Freq: Every day | ORAL | Status: DC

## 2019-11-20 MED ORDER — MIDAZOLAM HCL 5 MG/5ML IJ SOLN
INTRAMUSCULAR | Status: DC | PRN
  Administered 2019-11-20: 2 mg via INTRAVENOUS

## 2019-11-20 MED ORDER — LEVOTHYROXINE SODIUM 25 MCG PO TABS: 25.0000 ug | ORAL_TABLET | Freq: Every day | ORAL | Status: DC

## 2019-11-20 MED ORDER — METHOCARBAMOL 1000 MG/10ML IJ SOLN
500.0000 mg | Freq: Four times a day (QID) | INTRAVENOUS | Status: DC | PRN
  Filled 2019-11-20: qty 5

## 2019-11-20 MED ORDER — ONDANSETRON HCL 4 MG/2ML IJ SOLN: 4.0000 mg | Freq: Four times a day (QID) | INTRAMUSCULAR | Status: DC | PRN

## 2019-11-20 MED ORDER — VITAMIN D 25 MCG (1000 UNIT) PO TABS
3000.0000 [IU] | ORAL_TABLET | Freq: Every day | ORAL | Status: DC
  Administered 2019-11-21: 3000 [IU] via ORAL
  Filled 2019-11-20 (×2): qty 3

## 2019-11-20 MED ORDER — ZOLPIDEM TARTRATE 5 MG PO TABS: 5.0000 mg | ORAL_TABLET | Freq: Every evening | ORAL | Status: DC | PRN

## 2019-11-20 MED ORDER — ALUM & MAG HYDROXIDE-SIMETH 200-200-20 MG/5ML PO SUSP: 30.0000 mL | ORAL | Status: DC | PRN

## 2019-11-20 MED ORDER — ONDANSETRON HCL 4 MG/2ML IJ SOLN
INTRAMUSCULAR | Status: DC | PRN
  Administered 2019-11-20: 4 mg via INTRAVENOUS

## 2019-11-20 MED ORDER — ADULT MULTIVITAMIN W/MINERALS CH
1.0000 | ORAL_TABLET | Freq: Every day | ORAL | Status: DC
  Administered 2019-11-21: 1 via ORAL
  Filled 2019-11-20: qty 1

## 2019-11-20 MED ORDER — KETOROLAC TROMETHAMINE 30 MG/ML IJ SOLN: 30.0000 mg | Freq: Once | INTRAMUSCULAR | Status: DC | PRN

## 2019-11-20 MED ORDER — VITAMIN B-12 5000 MCG PO TBDP: 5000.0000 mg | ORAL_TABLET | Freq: Every day | ORAL | Status: DC

## 2019-11-20 MED ORDER — FENTANYL CITRATE (PF) 250 MCG/5ML IJ SOLN
INTRAMUSCULAR | Status: AC
  Filled 2019-11-20: qty 5

## 2019-11-20 MED ORDER — DIPHENHYDRAMINE HCL 12.5 MG/5ML PO ELIX
12.5000 mg | ORAL_SOLUTION | ORAL | Status: DC | PRN
  Filled 2019-11-20: qty 10

## 2019-11-20 MED ORDER — PROMETHAZINE HCL 25 MG/ML IJ SOLN: 6.2500 mg | INTRAMUSCULAR | Status: DC | PRN

## 2019-11-20 MED ORDER — GABAPENTIN 100 MG PO CAPS
100.0000 mg | ORAL_CAPSULE | Freq: Three times a day (TID) | ORAL | Status: DC
  Administered 2019-11-20 – 2019-11-21 (×4): 100 mg via ORAL
  Filled 2019-11-20 (×4): qty 1

## 2019-11-20 MED ORDER — 0.9 % SODIUM CHLORIDE (POUR BTL) OPTIME
TOPICAL | Status: DC | PRN
  Administered 2019-11-20: 1000 mL

## 2019-11-20 MED ORDER — ACETAMINOPHEN 325 MG PO TABS
325.0000 mg | ORAL_TABLET | Freq: Four times a day (QID) | ORAL | Status: DC | PRN
  Administered 2019-11-20 – 2019-11-21 (×2): 650 mg via ORAL
  Filled 2019-11-20 (×2): qty 2

## 2019-11-20 MED ORDER — ASPIRIN 81 MG PO CHEW
81.0000 mg | CHEWABLE_TABLET | Freq: Two times a day (BID) | ORAL | Status: DC
  Administered 2019-11-20 – 2019-11-21 (×2): 81 mg via ORAL
  Filled 2019-11-20 (×2): qty 1

## 2019-11-20 MED ORDER — METOCLOPRAMIDE HCL 5 MG/ML IJ SOLN: 5.0000 mg | Freq: Three times a day (TID) | INTRAMUSCULAR | Status: DC | PRN

## 2019-11-20 MED ORDER — METHOCARBAMOL 500 MG PO TABS
500.0000 mg | ORAL_TABLET | Freq: Four times a day (QID) | ORAL | Status: DC | PRN
  Administered 2019-11-20 – 2019-11-21 (×4): 500 mg via ORAL
  Filled 2019-11-20 (×4): qty 1

## 2019-11-20 MED ORDER — ONDANSETRON HCL 4 MG PO TABS: 4.0000 mg | ORAL_TABLET | Freq: Four times a day (QID) | ORAL | Status: DC | PRN

## 2019-11-20 MED ORDER — PHENYLEPHRINE HCL-NACL 10-0.9 MG/250ML-% IV SOLN
INTRAVENOUS | Status: DC | PRN
Start: 2019-11-20 — End: 2019-11-20
  Administered 2019-11-20: 10 ug/min via INTRAVENOUS

## 2019-11-20 MED ORDER — HYDROMORPHONE HCL 1 MG/ML IJ SOLN
INTRAMUSCULAR | Status: AC
  Filled 2019-11-20: qty 1

## 2019-11-20 MED ORDER — MENTHOL 3 MG MT LOZG: 1.0000 | LOZENGE | OROMUCOSAL | Status: DC | PRN

## 2019-11-20 SURGICAL SUPPLY — 56 items
APL SKNCLS STERI-STRIP NONHPOA (GAUZE/BANDAGES/DRESSINGS) ×1
BENZOIN TINCTURE PRP APPL 2/3 (GAUZE/BANDAGES/DRESSINGS) ×3 IMPLANT
BLADE CLIPPER SURG (BLADE) IMPLANT
BLADE SAW SGTL 18X1.27X75 (BLADE) ×2 IMPLANT
BLADE SAW SGTL 18X1.27X75MM (BLADE) ×1
CLOSURE WOUND 1/2 X4 (GAUZE/BANDAGES/DRESSINGS) ×2
COVER SURGICAL LIGHT HANDLE (MISCELLANEOUS) ×3 IMPLANT
COVER WAND RF STERILE (DRAPES) ×3 IMPLANT
CUP SECTOR GRIPTON 58MM (Orthopedic Implant) ×2 IMPLANT
DRAPE C-ARM 42X72 X-RAY (DRAPES) ×3 IMPLANT
DRAPE STERI IOBAN 125X83 (DRAPES) ×3 IMPLANT
DRAPE U-SHAPE 47X51 STRL (DRAPES) ×9 IMPLANT
DRSG AQUACEL AG ADV 3.5X10 (GAUZE/BANDAGES/DRESSINGS) ×3 IMPLANT
DURAPREP 26ML APPLICATOR (WOUND CARE) ×3 IMPLANT
ELECT BLADE 4.0 EZ CLEAN MEGAD (MISCELLANEOUS) ×3
ELECT BLADE 6.5 EXT (BLADE) IMPLANT
ELECT REM PT RETURN 9FT ADLT (ELECTROSURGICAL) ×3
ELECTRODE BLDE 4.0 EZ CLN MEGD (MISCELLANEOUS) ×1 IMPLANT
ELECTRODE REM PT RTRN 9FT ADLT (ELECTROSURGICAL) ×1 IMPLANT
FACESHIELD WRAPAROUND (MASK) ×6 IMPLANT
FACESHIELD WRAPAROUND OR TEAM (MASK) ×2 IMPLANT
GLOVE BIOGEL PI IND STRL 8 (GLOVE) ×2 IMPLANT
GLOVE BIOGEL PI INDICATOR 8 (GLOVE) ×4
GLOVE ECLIPSE 8.0 STRL XLNG CF (GLOVE) ×3 IMPLANT
GLOVE ORTHO TXT STRL SZ7.5 (GLOVE) ×6 IMPLANT
GOWN STRL REUS W/ TWL LRG LVL3 (GOWN DISPOSABLE) ×2 IMPLANT
GOWN STRL REUS W/ TWL XL LVL3 (GOWN DISPOSABLE) ×2 IMPLANT
GOWN STRL REUS W/TWL LRG LVL3 (GOWN DISPOSABLE) ×6
GOWN STRL REUS W/TWL XL LVL3 (GOWN DISPOSABLE) ×6
HANDPIECE INTERPULSE COAX TIP (DISPOSABLE) ×3
HEAD CERAMIC 36 PLUS5 (Hips) ×2 IMPLANT
KIT BASIN OR (CUSTOM PROCEDURE TRAY) ×3 IMPLANT
KIT TURNOVER KIT B (KITS) ×3 IMPLANT
LINER NEUTRAL 36X58 PLUS4 ×2 IMPLANT
MANIFOLD NEPTUNE II (INSTRUMENTS) ×3 IMPLANT
NS IRRIG 1000ML POUR BTL (IV SOLUTION) ×3 IMPLANT
PACK TOTAL JOINT (CUSTOM PROCEDURE TRAY) ×3 IMPLANT
PAD ARMBOARD 7.5X6 YLW CONV (MISCELLANEOUS) ×3 IMPLANT
SET HNDPC FAN SPRY TIP SCT (DISPOSABLE) ×1 IMPLANT
STAPLER VISISTAT 35W (STAPLE) IMPLANT
STEM CORAIL KA13 (Stem) ×2 IMPLANT
STRIP CLOSURE SKIN 1/2X4 (GAUZE/BANDAGES/DRESSINGS) ×4 IMPLANT
SUT ETHIBOND NAB CT1 #1 30IN (SUTURE) ×3 IMPLANT
SUT MNCRL AB 4-0 PS2 18 (SUTURE) IMPLANT
SUT VIC AB 0 CT1 27 (SUTURE) ×6
SUT VIC AB 0 CT1 27XBRD ANBCTR (SUTURE) ×1 IMPLANT
SUT VIC AB 1 CT1 27 (SUTURE) ×3
SUT VIC AB 1 CT1 27XBRD ANBCTR (SUTURE) ×1 IMPLANT
SUT VIC AB 2-0 CT1 27 (SUTURE) ×3
SUT VIC AB 2-0 CT1 TAPERPNT 27 (SUTURE) ×1 IMPLANT
TOWEL GREEN STERILE (TOWEL DISPOSABLE) ×3 IMPLANT
TOWEL GREEN STERILE FF (TOWEL DISPOSABLE) ×3 IMPLANT
TRAY CATH 16FR W/PLASTIC CATH (SET/KITS/TRAYS/PACK) ×2 IMPLANT
TRAY FOLEY W/BAG SLVR 16FR (SET/KITS/TRAYS/PACK)
TRAY FOLEY W/BAG SLVR 16FR ST (SET/KITS/TRAYS/PACK) IMPLANT
WATER STERILE IRR 1000ML POUR (IV SOLUTION) ×6 IMPLANT

## 2019-11-20 NOTE — Transfer of Care (Signed)
Immediate Anesthesia Transfer of Care Note  Patient: Phillip Hester  Procedure(s) Performed: RIGHT TOTAL HIP ARTHROPLASTY ANTERIOR APPROACH (Right Hip)  Patient Location: PACU  Anesthesia Type:MAC and Spinal  Level of Consciousness: awake, alert  and oriented  Airway & Oxygen Therapy: Patient Spontanous Breathing  Post-op Assessment: Report given to RN and Post -op Vital signs reviewed and stable  Post vital signs: Reviewed and stable  Last Vitals:  Vitals Value Taken Time  BP 132/86 11/20/19 1435  Temp 36.1 C 11/20/19 1335  Pulse 52 11/20/19 1437  Resp 15 11/20/19 1437  SpO2 98 % 11/20/19 1437  Vitals shown include unvalidated device data.  Last Pain:  Vitals:   11/20/19 1420  TempSrc:   PainSc: 0-No pain      Patients Stated Pain Goal: 4 (76/72/09 4709)  Complications: No apparent anesthesia complications

## 2019-11-20 NOTE — Progress Notes (Signed)
Orthopedic Tech Progress Note Patient Details:  Phillip Hester [birth year] [id number]  Placed overhead frame on patient bed.   Post Interventions Patient Tolerated: Well Instructions Provided: Adjustment of device, Care of device, Poper ambulation with device   Phillip Hester P Phillip Hester 11/20/2019, 8:50 PM

## 2019-11-20 NOTE — H&P (Signed)
TOTAL HIP ADMISSION H&P  Patient is admitted for right total hip arthroplasty.  Subjective:  Chief Complaint: right hip pain  HPI: Phillip Hester, 64 y.o. male, has a history of pain and functional disability in the right hip(s) due to arthritis and patient has failed non-surgical conservative treatments for greater than 12 weeks to include NSAID's and/or analgesics, corticosteriod injections, flexibility and strengthening excercises, weight reduction as appropriate and activity modification.  Onset of symptoms was gradual starting 2 years ago with gradually worsening course since that time.The patient noted no past surgery on the right hip(s).  Patient currently rates pain in the right hip at 10 out of 10 with activity. Patient has night pain, worsening of pain with activity and weight bearing, pain that interfers with activities of daily living and pain with passive range of motion. Patient has evidence of subchondral sclerosis, periarticular osteophytes and joint space narrowing by imaging studies. This condition presents safety issues increasing the risk of falls.  There is no current active infection.  Patient Active Problem List   Diagnosis Date Noted  . Unilateral primary osteoarthritis, right hip 07/18/2019  . Cardiovascular risk factor 12/13/2010  . Hyperlipidemia 10/18/2008  . OBSTRUCTIVE SLEEP APNEA 10/18/2008  . SNORING 10/18/2008   Past Medical History:  Diagnosis Date  . Arthritis    knees  . Cancer [facility])    bladder cancer  . GERD (gastroesophageal reflux disease)   . Heart murmur    diagnosed with "athlete's murmur" at 17  . History of kidney stones   . Hyperlipidemia   . Pure hypercholesterolemia   . Sleep apnea    CPAP at night    Past Surgical History:  Procedure Laterality Date  . INGUINAL HERNIA REPAIR    . IRRIGATION AND DEBRIDEMENT SEBACEOUS CYST    . KNEE SURGERY Left 2013  . SHOULDER SURGERY Right 2013  . WISDOM TOOTH EXTRACTION      Current  Facility-Administered Medications  Medication Dose Route Frequency Provider Last Rate Last Admin  . ceFAZolin (ANCEF) IVPB 2g/100 mL premix  2 g Intravenous On Call to OR Pete Pelt, PA-C      . povidone-iodine 10 % swab 2 application  2 application Topical Once Pete Pelt, PA-C      . tranexamic acid (CYKLOKAPRON) IVPB 1,000 mg  1,000 mg Intravenous To OR Pete Pelt, PA-C       Allergies  Allergen Reactions  . Bee Venom Anaphylaxis  . Prednisone     REACTION: "drove me nutty"    Social History   Tobacco Use  . Smoking status: Never Smoker  . Smokeless tobacco: Never Used  Substance Use Topics  . Alcohol use: Yes    Alcohol/week: 2.0 - 3.0 standard drinks    Types: 2 - 3 Standard drinks or equivalent per week    Comment: 2-3 drinks weekly    Family History  Problem Relation Age of Onset  . Arthritis Mother   . Dementia Father   . Cancer Maternal Grandfather   . Cancer Paternal Grandfather      Review of Systems  All other systems reviewed and are negative.   Objective:  Physical Exam  Constitutional: He is oriented to person, place, and time. He appears well-developed and well-nourished.  HENT:  Head: Normocephalic and atraumatic.  Eyes: Pupils are equal, round, and reactive to light. EOM are normal.  Cardiovascular: Normal rate.  Respiratory: Effort normal.  GI: Soft.  Musculoskeletal:     Cervical back:  Normal range of motion and neck supple.     Right hip: Tenderness and bony tenderness present. Decreased range of motion. Decreased strength.  Neurological: He is alert and oriented to person, place, and time.  Skin: Skin is warm and dry.  Psychiatric: He has a normal mood and affect.    Vital signs in last 24 hours: Temp:  [98.2 F (36.8 C)] 98.2 F (36.8 C) (06/08 1002) Pulse Rate:  [70] 70 (06/08 1002) Resp:  [19] 19 (06/08 1002) BP: (153)/(87) 153/87 (06/08 1002) SpO2:  [97 %] 97 % (06/08 1002) Weight:  [117 kg] 117 kg (06/08  1002)  Labs:   Estimated body mass index is 32.25 kg/m as calculated from the following:   Height as of this encounter: 6\' 3"  (1.905 m).   Weight as of this encounter: 117 kg.   Imaging Review Plain radiographs demonstrate moderate degenerative joint disease of the right hip(s). The bone quality appears to be excellent for age and reported activity level.      Assessment/Plan:  End stage arthritis, right hip(s)  The patient history, physical examination, clinical judgement of the provider and imaging studies are consistent with end stage degenerative joint disease of the right hip(s) and total hip arthroplasty is deemed medically necessary. The treatment options including medical management, injection therapy, arthroscopy and arthroplasty were discussed at length. The risks and benefits of total hip arthroplasty were presented and reviewed. The risks due to aseptic loosening, infection, stiffness, dislocation/subluxation,  thromboembolic complications and other imponderables were discussed.  The patient acknowledged the explanation, agreed to proceed with the plan and consent was signed. Patient is being admitted for inpatient treatment for surgery, pain control, PT, OT, prophylactic antibiotics, VTE prophylaxis, progressive ambulation and ADL's and discharge planning.The patient is planning to be discharged home with home health services

## 2019-11-20 NOTE — Anesthesia Procedure Notes (Signed)
Procedure Name: MAC Date/Time: 11/20/2019 12:10 PM Performed by: Griffin Dakin, CRNA Pre-anesthesia Checklist: Patient identified, Emergency Drugs available, Suction available and Patient being monitored Patient Re-evaluated:Patient Re-evaluated prior to induction Oxygen Delivery Method: Simple face mask Induction Type: IV induction Airway Equipment and Method: Oral airway Placement Confirmation: positive ETCO2 and breath sounds checked- equal and bilateral Dental Injury: Teeth and Oropharynx as per pre-operative assessment

## 2019-11-20 NOTE — Brief Op Note (Signed)
11/20/2019  1:22 PM  PATIENT:  Phillip Hester  64 y.o. male  PRE-OPERATIVE DIAGNOSIS:  right hip osteoarthritis  POST-OPERATIVE DIAGNOSIS:  right hip osteoarthritis  PROCEDURE:  Procedure(s): RIGHT TOTAL HIP ARTHROPLASTY ANTERIOR APPROACH (Right)  SURGEON:  Surgeon(s) and Role:    Mcarthur Rossetti, MD - Primary  PHYSICIAN ASSISTANT: Benita Stabile, PA-C  ANESTHESIA:   spinal  EBL:  200 mL   COUNTS:  YES  DICTATION: .Other Dictation: Dictation Number [id number]  PLAN OF CARE: Admit for overnight observation  PATIENT DISPOSITION:  PACU - hemodynamically stable.   Delay start of Pharmacological VTE agent (>24hrs) due to surgical blood loss or risk of bleeding: no

## 2019-11-20 NOTE — Evaluation (Signed)
Physical Therapy Evaluation Patient Details Name: Phillip Hester MRN: [id number] DOB: 21-Feb-1956 Today's Date: 11/20/2019   History of Present Illness  Pt is a 64 y/o male s/p R THA, direct anterior. PMH includes OSA on CPAP and bladder cancer.   Clinical Impression  Pt is s/p surgery above with deficits below. Pt with 9/10 pain which limited mobility tolerance this session. Was able to take side steps at EOB. Required min to min guard A to stand and take steps at EOB using RW. Anticipate pt will progress well once pain controlled. Will continue to follow acutely to maximize functional mobility independence and safety.     Follow Up Recommendations Follow surgeon's recommendation for DC plan and follow-up therapies;Supervision for mobility/OOB    Equipment Recommendations  Rolling walker with 5" wheels;3in1 (PT)    Recommendations for Other Services       Precautions / Restrictions Precautions Precautions: Fall Restrictions Weight Bearing Restrictions: Yes RLE Weight Bearing: Weight bearing as tolerated      Mobility  Bed Mobility Overal bed mobility: Needs Assistance Bed Mobility: Supine to Sit;Sit to Supine     Supine to sit: Min assist Sit to supine: Mod assist   General bed mobility comments: Min A for RLE assist to come to EOB. Very slow to move secondary to pain. Required mod A for BLE assist for return to supine.   Transfers Overall transfer level: Needs assistance Equipment used: Rolling walker (2 wheeled) Transfers: Sit to/from Stand Sit to Stand: Min assist;From elevated surface         General transfer comment: Min A for lift assist and steadying. Pt reporting some dizziness upon standing.   Ambulation/Gait Ambulation/Gait assistance: Min guard   Assistive device: Rolling walker (2 wheeled)       General Gait Details: took side steps at EOB this session using RW. Limited weightshift to RLE secondary to pain. Pt reporting some dizziness so further  mobility deferred.   Stairs            Wheelchair Mobility    Modified Rankin (Stroke Patients Only)       Balance Overall balance assessment: Needs assistance Sitting-balance support: Feet supported;No upper extremity supported Sitting balance-Leahy Scale: Fair     Standing balance support: Bilateral upper extremity supported;During functional activity Standing balance-Leahy Scale: Poor Standing balance comment: Reliant on BUE support                              Pertinent Vitals/Pain Pain Assessment: 0-10 Pain Score: 9  Pain Location: R hip  Pain Descriptors / Indicators: Aching;Operative site guarding;Grimacing;Guarding Pain Intervention(s): Monitored during session;Limited activity within patient's tolerance;Repositioned    Home Living Family/patient expects to be discharged to:: Private residence Living Arrangements: Children Available Help at Discharge: Family Type of Home: House Home Access: Stairs to enter   Technical brewer of Steps: 6-7 Home Layout: Two level Home Equipment: Crutches      Prior Function Level of Independence: Independent               Hand Dominance        Extremity/Trunk Assessment   Upper Extremity Assessment Upper Extremity Assessment: Overall WFL for tasks assessed    Lower Extremity Assessment Lower Extremity Assessment: RLE deficits/detail RLE Deficits / Details: Deficits consistent with post op pain and weakness. Very little tolerance for ROM. Pt also reporting increased knee pain    Cervical / Trunk Assessment Cervical / Trunk  Assessment: Normal  Communication   Communication: No difficulties  Cognition Arousal/Alertness: Suspect due to medications Behavior During Therapy: WFL for tasks assessed/performed Overall Cognitive Status: Within Functional Limits for tasks assessed                                        General Comments General comments (skin integrity, edema,  etc.): Pt's daughters present during session     Exercises Total Joint Exercises Ankle Circles/Pumps: AROM;Both;20 reps;Supine   Assessment/Plan    PT Assessment Patient needs continued PT services  PT Problem List Decreased strength;Decreased range of motion;Decreased activity tolerance;Decreased balance;Decreased mobility;Decreased knowledge of use of DME;Pain       PT Treatment Interventions DME instruction;Gait training;Therapeutic activities;Functional mobility training;Stair training;Therapeutic exercise;Balance training;Patient/family education    PT Goals (Current goals can be found in the Care Plan section)  Acute Rehab PT Goals Patient Stated Goal: to decrease pain  PT Goal Formulation: With patient Time For Goal Achievement: 12/04/19 Potential to Achieve Goals: Good    Frequency 7X/week   Barriers to discharge        Co-evaluation               AM-PAC PT "6 Clicks" Mobility  Outcome Measure Help needed turning from your back to your side while in a flat bed without using bedrails?: A Little Help needed moving from lying on your back to sitting on the side of a flat bed without using bedrails?: A Little Help needed moving to and from a bed to a chair (including a wheelchair)?: A Little Help needed standing up from a chair using your arms (e.g., wheelchair or bedside chair)?: A Little Help needed to walk in hospital room?: A Lot Help needed climbing 3-5 steps with a railing? : A Lot 6 Click Score: 16    End of Session Equipment Utilized During Treatment: Gait belt Activity Tolerance: Patient limited by pain Patient left: in bed;with call bell/phone within reach;with family/visitor present Nurse Communication: Mobility status PT Visit Diagnosis: Other abnormalities of gait and mobility (R26.89);Pain Pain - Right/Left: Right Pain - part of body: Hip    Time: 1701-1735 PT Time Calculation (min) (ACUTE ONLY): 34 min   Charges:   PT Evaluation $PT Eval  Low Complexity: 1 Low PT Treatments $Therapeutic Activity: 8-22 mins        Lou Miner, DPT  Acute Rehabilitation Services  Pager: [phone] Office: [phone]   Rudean Hitt 11/20/2019, 6:15 PM

## 2019-11-20 NOTE — Anesthesia Procedure Notes (Signed)
Spinal  Patient location during procedure: OR Start time: 11/20/2019 12:04 PM End time: 11/20/2019 12:06 PM Staffing Performed: anesthesiologist  Anesthesiologist: Lyn Hollingshead, MD Preanesthetic Checklist Completed: patient identified, IV checked, site marked, risks and benefits discussed, surgical consent, monitors and equipment checked, pre-op evaluation and timeout performed Spinal Block Patient position: sitting Prep: DuraPrep and site prepped and draped Patient monitoring: continuous pulse ox and blood pressure Approach: midline Location: L3-4 Injection technique: single-shot Needle Needle type: Pencan  Needle gauge: 24 G Needle length: 10 cm Needle insertion depth: 5 cm Assessment Sensory level: T8

## 2019-11-20 NOTE — Op Note (Signed)
NAME: Phillip Hester, Phillip Hester MEDICAL RECORD PN:[id number] ACCOUNT [id number] DATE OF BIRTH:1956-01-08 FACILITY: [facility] LOCATION: [facility] PHYSICIAN:Eilah Common Kerry Fort, MD  OPERATIVE REPORT  DATE OF PROCEDURE:  11/20/2019  PREOPERATIVE DIAGNOSIS:  Primary osteoarthritis and degenerative joint disease, right hip.  POSTOPERATIVE DIAGNOSIS:  Primary osteoarthritis and degenerative joint disease, right hip.  PROCEDURE:  Right total hip arthroplasty through direct anterior approach.  IMPLANTS:  DePuy Sector Gription acetabular component size 58, size 36+4 neutral polyethylene liner, size 13 Corail femoral component with standard offset, size 36+5 ceramic hip ball.  SURGEON:  Lind Guest. Ninfa Linden, MD  ASSISTANT:  Erskine Emery, PA-C.  ANESTHESIA:  Spinal.  ANTIBIOTICS:  Two g of IV Ancef.  ESTIMATED BLOOD LOSS:  200 mL.  COMPLICATIONS:  None.  INDICATIONS:  The patient is a 64 year old gentleman with moderate to severe arthritis involving his right hip.  He also has dealt with a hernia and hernia surgery on the right side.  He did have an intra-articular steroid injection in the right hip and  it did calm things down for a little bit.  An MRI was obtained and the right hip and it did confirm significant arthritis in the right hip.  Due to his daily right hip pain and the failure of conservative treatment, combined with his arthritic findings,  he does wish to proceed with total hip arthroplasty.  He understands the risk of acute blood loss anemia, nerve or vessel injury, fracture, infection, dislocation, DVT and implant failure.  He understands our goals are to decrease pain, improve mobility  and overall improve quality of life.  DESCRIPTION OF PROCEDURE:  After informed consent was obtained, the appropriate right hip was marked.  He was brought to the operating room and sat up on a stretcher where spinal anesthesia was obtained.  He was then laid in supine position on a  stretcher.  I  was able to assess his leg lengths and found them to be equal.  I placed traction boots on both his feet.  I next placed him supine on the Hana fracture table with the perineal post in place and both legs in line skeletal traction device  and no traction applied.  His right operative hip was prepped and draped with DuraPrep and sterile drapes.  I did bring in the C-arm to assess him preoperatively as well and it showed that he is longer on the right side, but we know he is not.  We will  match the x-ray and keep him on the same offset and leg length based on the intraoperative films.  After timeout was called to identify correct patient and correct right hip, we made an incision just inferior and posterior to the anterior defect spine  and carried this obliquely down the leg.  We dissected down to the tensor fascia lata muscle.  Tensor fascia was then divided longitudinally to proceed with direct anterior approach to the hip.  We identified and cauterized circumflex vessels and  identified the hip capsule, entered the hip capsule in an L-type format finding a moderate joint effusion and significant periarticular osteophytes around the femoral head and neck.  I placed Cobra retractors around the medial and lateral femoral neck  and made our femoral neck cut with oscillating saw and completed this with an osteotome.  We placed a corkscrew guide in the femoral head and removed the femoral head in its entirety and found a wide area devoid of cartilage.  We then placed a bent  Hohmann over the medial acetabular  rim and removed remnants of the acetabular labrum and other debris.  I then began reaming under direct visualization from a size 44 reamer in stepwise increments going all the way up to a size 57, with all reamers under  direct visualization, the last reamer was placed under direct fluoroscopy, so we could obtain our depth of reaming, our inclination and anteversion.  I then placed the real DePuy Sector  Gription acetabular component size 58 and a 36+4 neutral  polyethylene liner for that size acetabular component.  Attention was then turned to the femur.  With the leg externally rotated to 120 degrees, extended and adducted, we were able to place a Mueller retractor medially and a Hohmann retractor above the  greater trochanter.  We released the lateral joint capsule and used a box-cutting osteotome to enter the femoral canal and a rongeur to lateralize.  We then began broaching using the Corail broaching system from a size 8 going up to a size 13.  With the  13 in place, we trialed a standard offset femoral neck and a 36+1.5 hip ball, reduced this in the acetabulum.  We were pleased with stability, but we definitely needed more offset and leg length.  We dislocated the hip and removed the trial components.   We then placed the real Corail femoral component size 13 with standard offset and the real 36+5 ceramic hip ball, again reduced this in the acetabulum.  We were pleased with the leg length, offset, range of motion and stability.  We then irrigated the  soft tissue with normal saline solution using pulsatile lavage.  We closed the joint capsule with interrupted #1 Ethibond suture, followed by closing the tensor fascia with #1 Vicryl.  Zero Vicryl was used to close deep tissue, 2-0 Vicryl was used to  close the subcutaneous tissue.  The skin was reapproximated with staples.  Xeroform and Aquacel dressing was applied.  The patient was taken off the Hana table and taken to the recovery room in stable condition.  All final counts were correct.  There  were no complications noted.  Of note, Benita Stabile, PA-C, assisted during the entire case and his assistance was crucial for facilitating all aspects of this case.  VN/NUANCE  D:11/20/2019 T:11/20/2019 JOB:[id number]

## 2019-11-21 ENCOUNTER — Encounter: Payer: Self-pay | Admitting: *Deleted

## 2019-11-21 DIAGNOSIS — M1611 Unilateral primary osteoarthritis, right hip: Secondary | ICD-10-CM | POA: Diagnosis not present

## 2019-11-21 LAB — BASIC METABOLIC PANEL
Anion gap: 8 (ref 5–15)
BUN: 21 mg/dL (ref 8–23)
CO2: 26 mmol/L (ref 22–32)
Calcium: 8.7 mg/dL — ABNORMAL LOW (ref 8.9–10.3)
Chloride: 104 mmol/L (ref 98–111)
Creatinine, Ser: 1.22 mg/dL (ref 0.61–1.24)
GFR calc Af Amer: >60 mL/min (ref >60)
GFR calc non Af Amer: >60 mL/min (ref >60)
Glucose, Bld: 126 mg/dL — ABNORMAL HIGH (ref 70–99)
Potassium: 4.5 mmol/L (ref 3.5–5.1)
Sodium: 138 mmol/L (ref 135–145)

## 2019-11-21 LAB — CBC
HCT: 46.9 % (ref 39.0–52.0)
Hemoglobin: 15.3 g/dL (ref 13.0–17.0)
MCH: 29.9 pg (ref 26.0–34.0)
MCHC: 32.6 g/dL (ref 30.0–36.0)
MCV: 91.6 fL (ref 80.0–100.0)
Platelets: 187 10*3/uL (ref 150–400)
RBC: 5.12 MIL/uL (ref 4.22–5.81)
RDW: 13.5 % (ref 11.5–15.5)
WBC: 9.5 10*3/uL (ref 4.0–10.5)
nRBC: 0 % (ref 0.0–0.2)

## 2019-11-21 MED ORDER — METHOCARBAMOL 500 MG PO TABS: 500.0000 mg | ORAL_TABLET | Freq: Four times a day (QID) | ORAL | 1 refills | Status: DC | PRN

## 2019-11-21 MED ORDER — OXYCODONE HCL 5 MG PO TABS: 5.0000 mg | ORAL_TABLET | ORAL | 0 refills | Status: DC | PRN

## 2019-11-21 MED ORDER — ASPIRIN 81 MG PO CHEW: 81.0000 mg | CHEWABLE_TABLET | Freq: Two times a day (BID) | ORAL | 0 refills | Status: DC

## 2019-11-21 MED ORDER — ONDANSETRON 4 MG PO TBDP
4.0000 mg | ORAL_TABLET | Freq: Three times a day (TID) | ORAL | 0 refills | Status: DC | PRN
Start: 2019-11-21 — End: 2022-03-22

## 2019-11-21 NOTE — Discharge Summary (Signed)
Patient ID: Phillip Hester MRN: [id number] DOB/AGE: [birth year] 64 y.o.  Admit date: 11/20/2019 Discharge date: 11/21/2019  Admission Diagnoses:  Principal Problem:   Unilateral primary osteoarthritis, right hip Active Problems:   Status post total replacement of right hip   Discharge Diagnoses:  Same  Past Medical History:  Diagnosis Date  . Arthritis    knees  . Cancer [facility])    bladder cancer  . GERD (gastroesophageal reflux disease)   . Heart murmur    diagnosed with "athlete's murmur" at 17  . History of kidney stones   . Hyperlipidemia   . Pure hypercholesterolemia   . Sleep apnea    CPAP at night    Surgeries: Procedure(s): RIGHT TOTAL HIP ARTHROPLASTY ANTERIOR APPROACH on 11/20/2019   Consultants:   Discharged Condition: Improved  Hospital Course: Marcelino Campos is an 64 y.o. male who was admitted 11/20/2019 for operative treatment ofUnilateral primary osteoarthritis, right hip. Patient has severe unremitting pain that affects sleep, daily activities, and work/hobbies. After pre-op clearance the patient was taken to the operating room on 11/20/2019 and underwent  Procedure(s): RIGHT TOTAL HIP ARTHROPLASTY ANTERIOR APPROACH.    Patient was given perioperative antibiotics:  Anti-infectives (From admission, onward)   Start     Dose/Rate Route Frequency Ordered Stop   11/20/19 1800  ceFAZolin (ANCEF) IVPB 1 g/50 mL premix     1 g 100 mL/hr over 30 Minutes Intravenous Every 6 hours 11/20/19 1546 11/21/19 0738   11/20/19 1000  ceFAZolin (ANCEF) IVPB 2g/100 mL premix  Status:  Discontinued     2 g 200 mL/hr over 30 Minutes Intravenous On call to O.R. 11/20/19 0950 11/20/19 0954   11/20/19 1000  ceFAZolin (ANCEF) IVPB 2g/100 mL premix     2 g 200 mL/hr over 30 Minutes Intravenous On call to O.R. 11/20/19 2482 11/20/19 1215       Patient was given sequential compression devices, early ambulation, and chemoprophylaxis to prevent DVT.  Patient benefited maximally from  hospital stay and there were no complications.    Recent vital signs:  Patient Vitals for the past 24 hrs:  BP Temp Temp src Pulse Resp SpO2 Height Weight  11/21/19 0751 115/74 98.4 F (36.9 C) Oral (!) 52 16 96 % -- --  11/21/19 0421 120/68 99.4 F (37.4 C) Oral 65 20 98 % -- --  11/20/19 2325 135/68 98.2 F (36.8 C) Oral 60 18 96 % -- --  11/20/19 2018 (!) 162/92 98.3 F (36.8 C) Oral 66 18 98 % -- --  11/20/19 1542 (!) 135/95 97.6 F (36.4 C) Oral (!) 56 18 98 % -- --  11/20/19 1520 128/80 97.6 F (36.4 C) -- (!) 55 13 97 % -- --  11/20/19 1505 -- 97.6 F (36.4 C) -- -- -- -- -- --  11/20/19 1450 133/88 -- -- (!) 51 15 95 % -- --  11/20/19 1445 -- -- -- (!) 59 15 96 % -- --  11/20/19 1435 -- 97.6 F (36.4 C) -- (!) 56 -- 96 % -- --  11/20/19 1420 129/88 -- -- (!) 56 17 95 % -- --  11/20/19 1405 126/85 -- -- (!) 50 17 96 % -- --  11/20/19 1350 128/84 -- -- (!) 56 15 95 % -- --  11/20/19 1335 113/76 (!) 97 F (36.1 C) -- 63 18 97 % -- --  11/20/19 1002 (!) 153/87 98.2 F (36.8 C) Tympanic 70 19 97 % 6\' 3"  (1.905 m) 117 kg  Recent laboratory studies:  Recent Labs    11/21/19 0622  WBC 9.5  HGB 15.3  HCT 46.9  PLT 187  NA 138  K 4.5  CL 104  CO2 26  BUN 21  CREATININE 1.22  GLUCOSE 126*  CALCIUM 8.7*     Discharge Medications:   Allergies as of 11/21/2019      Reactions   Bee Venom Anaphylaxis   Prednisone    REACTION: "drove me nutty"      Medication List    STOP taking these medications   aspirin EC 81 MG tablet Replaced by: aspirin 81 MG chewable tablet   Fish Oil 1200 MG Caps   nabumetone 750 MG tablet Commonly known as: RELAFEN   naproxen sodium 220 MG tablet Commonly known as: ALEVE     TAKE these medications   aspirin 81 MG chewable tablet Chew 1 tablet (81 mg total) by mouth 2 (two) times daily. Replaces: aspirin EC 81 MG tablet   b complex vitamins tablet Take 2-3 tablets by mouth daily.   DHEA PO Take 75 mg by mouth daily.    FLAX SEED OIL PO Take 1,400-2,800 mg by mouth daily.   levothyroxine 25 MCG tablet Commonly known as: SYNTHROID Take 25 mcg by mouth daily.   Melatonin 10 MG Tabs Take 10-20 mg by mouth at bedtime.   methocarbamol 500 MG tablet Commonly known as: ROBAXIN Take 1 tablet (500 mg total) by mouth every 6 (six) hours as needed for muscle spasms.   multivitamin tablet Take 1 tablet by mouth daily. Multi Genic without Iron   omeprazole 20 MG capsule Commonly known as: PRILOSEC Take 20 mg by mouth daily as needed (reflux).   ondansetron 4 MG disintegrating tablet Commonly known as: Zofran ODT Take 1 tablet (4 mg total) by mouth every 8 (eight) hours as needed for nausea or vomiting.   OVER THE COUNTER MEDICATION Apply 1 application topically daily as needed (Pain). DMSO cream   oxyCODONE 5 MG immediate release tablet Commonly known as: Oxy IR/ROXICODONE Take 1-2 tablets (5-10 mg total) by mouth every 4 (four) hours as needed for moderate pain (pain score 4-6).   traMADol 50 MG tablet Commonly known as: ULTRAM TAKE 1 TO 2 TABLETS(50 TO 100 MG) BY MOUTH EVERY 6 HOURS AS NEEDED What changed: See the new instructions.   Vitamin B-12 5000 MCG Tbdp Take 5,000-10,000 mg by mouth daily.   vitamin C 1000 MG tablet Take 3,000 mg by mouth daily.   Vitamin D3 25 MCG (1000 UT) Caps Take 3,000 Units by mouth daily.   Xyosted 50 MG/0.5ML Soaj Generic drug: Testosterone Enanthate Inject 50 mg as directed once a week.   zinc gluconate 50 MG tablet Take 100 mg by mouth daily.            Durable Medical Equipment  (From admission, onward)         Start     Ordered   11/20/19 1547  DME 3 n 1  Once     11/20/19 1546   11/20/19 1547  DME Walker rolling  Once    Question Answer Comment  Walker: With 5 Inch Wheels   Patient needs a walker to treat with the following condition Status post total replacement of right hip      11/20/19 1546          Diagnostic Studies: DG  Pelvis Portable  Result Date: 11/20/2019 CLINICAL DATA:  Post RIGHT total hip replacement EXAM: PORTABLE  PELVIS 1-2 VIEWS COMPARISON:  Portable exam 1401 hours compared to 08/15/2019 FINDINGS: Interval placement of a RIGHT hip prosthesis. Low normal osseous mineralization. SI joints and LEFT hip joint space preserved. No fracture, dislocation, or bone destruction. IMPRESSION: RIGHT hip prosthesis without acute complication. Electronically Signed   By: Lavonia Dana M.D.   On: 11/20/2019 14:36   DG C-Arm 1-60 Min  Result Date: 11/20/2019 CLINICAL DATA:  Right total hip arthroplasty EXAM: DG C-ARM 1-60 MIN FLUOROSCOPY TIME:  Fluoroscopy Time:  27.2 seconds Radiation Exposure Index (if provided by the fluoroscopic device): 3.1543 mGy Number of Acquired Spot Images: 3 COMPARISON:  08/15/2019 FINDINGS: 3 fluoroscopic images are obtained during the performance of the procedure and are provided for interpretation only. Right hip arthroplasty is identified in the expected position without signs of complication. IMPRESSION: 1. Intraoperative exam as above. Electronically Signed   By: Randa Ngo M.D.   On: 11/20/2019 15:12   DG HIP OPERATIVE UNILAT W OR W/O PELVIS RIGHT  Result Date: 11/20/2019 CLINICAL DATA:  RIGHT hip arthroplasty EXAM: OPERATIVE RIGHT HIP (WITH PELVIS IF PERFORMED) 2 VIEWS TECHNIQUE: Fluoroscopic spot image(s) were submitted for interpretation post-operatively. COMPARISON:  08/15/2019 FLUOROSCOPY TIME:  0 minutes 27 seconds Dose: 3.1543 mGy FINDINGS: RIGHT hip prosthesis identified in expected position. No fracture, dislocation or bone destruction seen in AP projection. IMPRESSION: RIGHT hip prosthesis without acute complication. Electronically Signed   By: Lavonia Dana M.D.   On: 11/20/2019 14:37    Disposition: Discharge disposition: 01-Home or [place]    Mcarthur Rossetti, MD Follow up in 2 week(s).   Specialty: Orthopedic Surgery Contact  information: [address] [place] [address] [phone]            Signed: Mcarthur Rossetti 11/21/2019, 7:56 AM

## 2019-11-21 NOTE — Discharge Instructions (Signed)

## 2019-11-21 NOTE — Progress Notes (Signed)
Physical Therapy Treatment Patient Details Name: Phillip Hester MRN: [id number] DOB: 16-May-1956 Today's Date: 11/21/2019    History of Present Illness Pt is a 64 y/o male s/p R THA, direct anterior. PMH includes OSA on CPAP and bladder cancer.     PT Comments    Pt making good progress overall. He was able to tolerate hallway ambulation with use of RW and supervision for safety. Plan for stair training at next session prior to d/c home today.   Follow Up Recommendations  Home health PT     Equipment Recommendations  Rolling walker with 5" wheels;3in1 (PT)    Recommendations for Other Services       Precautions / Restrictions Precautions Precautions: Fall Restrictions Weight Bearing Restrictions: Yes RLE Weight Bearing: Weight bearing as tolerated    Mobility  Bed Mobility Overal bed mobility: Needs Assistance Bed Mobility: Supine to Sit;Sit to Supine     Supine to sit: Min assist Sit to supine: Min assist   General bed mobility comments: min A for R LE management off of and back onto bed  Transfers Overall transfer level: Needs assistance Equipment used: Rolling walker (2 wheeled) Transfers: Sit to/from Stand Sit to Stand: Min guard         General transfer comment: cueing for safe hand placement  Ambulation/Gait Ambulation/Gait assistance: Supervision Gait Distance (Feet): 300 Feet Assistive device: Rolling walker (2 wheeled) Gait Pattern/deviations: Step-through pattern;Decreased stride length;Decreased step length - left;Decreased stance time - right Gait velocity: decreased   General Gait Details: pt steady overall with use of RW; accepting partial WB'ing through R LE; no LOB or need for physical assistance   Stairs             Wheelchair Mobility    Modified Rankin (Stroke Patients Only)       Balance Overall balance assessment: Needs assistance Sitting-balance support: Feet supported;No upper extremity supported Sitting  balance-Leahy Scale: Fair     Standing balance support: Bilateral upper extremity supported;During functional activity Standing balance-Leahy Scale: Poor Standing balance comment: Reliant on BUE support                             Cognition Arousal/Alertness: Awake/alert Behavior During Therapy: WFL for tasks assessed/performed Overall Cognitive Status: Within Functional Limits for tasks assessed                                        Exercises Total Joint Exercises Hip ABduction/ADduction: Standing;AROM;Strengthening;Right;10 reps Marching in Standing: AROM;Strengthening;Right;10 reps Standing Hip Extension: AROM;Strengthening;Right;10 reps    General Comments        Pertinent Vitals/Pain Pain Assessment: 0-10 Pain Score: 7  Pain Location: R hip  Pain Descriptors / Indicators: Aching;Operative site guarding;Grimacing;Guarding Pain Intervention(s): Monitored during session;Repositioned    Home Living Family/patient expects to be discharged to:: Private residence Living Arrangements: Children                  Prior Function            PT Goals (current goals can now be found in the care plan section) Acute Rehab PT Goals PT Goal Formulation: With patient Time For Goal Achievement: 12/04/19 Potential to Achieve Goals: Good Progress towards PT goals: Progressing toward goals    Frequency    7X/week      PT Plan Discharge plan needs  to be updated    Co-evaluation              AM-PAC PT "6 Clicks" Mobility   Outcome Measure  Help needed turning from your back to your side while in a flat bed without using bedrails?: A Little Help needed moving from lying on your back to sitting on the side of a flat bed without using bedrails?: A Little Help needed moving to and from a bed to a chair (including a wheelchair)?: A Little Help needed standing up from a chair using your arms (e.g., wheelchair or bedside chair)?:  None Help needed to walk in hospital room?: None Help needed climbing 3-5 steps with a railing? : A Little 6 Click Score: 20    End of Session Equipment Utilized During Treatment: Gait belt Activity Tolerance: Patient limited by pain Patient left: in bed;with call bell/phone within reach Nurse Communication: Mobility status PT Visit Diagnosis: Other abnormalities of gait and mobility (R26.89);Pain Pain - Right/Left: Right Pain - part of body: Hip     Time: 5427-0623 PT Time Calculation (min) (ACUTE ONLY): 30 min  Charges:  $Gait Training: 8-22 mins $Therapeutic Exercise: 8-22 mins                     Anastasio Champion, DPT  Acute Rehabilitation Services Pager [phone] Office [place] 11/21/2019, 10:18 AM

## 2019-11-21 NOTE — Progress Notes (Signed)
Subjective: 1 Day Post-Op Procedure(s) (LRB): RIGHT TOTAL HIP ARTHROPLASTY ANTERIOR APPROACH (Right) Patient reports pain as moderate.    Objective: Vital signs in last 24 hours: Temp:  [97 F (36.1 C)-99.4 F (37.4 C)] 98.4 F (36.9 C) (06/09 0751) Pulse Rate:  [50-70] 52 (06/09 0751) Resp:  [13-20] 16 (06/09 0751) BP: (113-162)/(68-95) 115/74 (06/09 0751) SpO2:  [95 %-98 %] 96 % (06/09 0751) Weight:  [157 kg] 117 kg (06/08 1002)  Intake/Output from previous day: 06/08 0701 - 06/09 0700 In: 120 [P.O.:120] Out: 650 [Urine:450; Blood:200] Intake/Output this shift: No intake/output data recorded.  Recent Labs    11/21/19 0622  HGB 15.3   Recent Labs    11/21/19 0622  WBC 9.5  RBC 5.12  HCT 46.9  PLT 187   Recent Labs    11/21/19 0622  NA 138  K 4.5  CL 104  CO2 26  BUN 21  CREATININE 1.22  GLUCOSE 126*  CALCIUM 8.7*   No results for input(s): LABPT, INR in the last 72 hours.  Sensation intact distally Intact pulses distally Dorsiflexion/Plantar flexion intact Incision: dressing C/D/I   Assessment/Plan: 1 Day Post-Op Procedure(s) (LRB): RIGHT TOTAL HIP ARTHROPLASTY ANTERIOR APPROACH (Right) Up with therapy Discharge home with home health this afternoon.    Patient's anticipated LOS is less than 2 midnights, meeting these requirements: - Younger than 20 - Lives within 1 hour of care - Has a competent adult at home to recover with post-op recover - NO history of  - Chronic pain requiring opiods  - Diabetes  - Coronary Artery Disease  - Heart failure  - Heart attack  - Stroke  - DVT/VTE  - Cardiac arrhythmia  - Respiratory Failure/COPD  - Renal failure  - Anemia  - Advanced Liver disease       Mcarthur Rossetti 11/21/2019, 7:52 AM

## 2019-11-21 NOTE — Progress Notes (Signed)
Physical Therapy Treatment Patient Details Name: Phillip Hester MRN: [id number] DOB: 08-25-1955 Today's Date: 11/21/2019    History of Present Illness Pt is a 64 y/o male s/p R THA, direct anterior. PMH includes OSA on CPAP and bladder cancer.     PT Comments    Pt continuing to make steady progress overall. He tolerated stair training well this session with daughter present throughout. PT provided handouts for stair techniques as well. Plan is for pt to d/c home today with family support.    Follow Up Recommendations  Home health PT     Equipment Recommendations  Rolling walker with 5" wheels;3in1 (PT)    Recommendations for Other Services       Precautions / Restrictions Precautions Precautions: Fall Restrictions Weight Bearing Restrictions: Yes RLE Weight Bearing: Weight bearing as tolerated    Mobility  Bed Mobility Overal bed mobility: Needs Assistance Bed Mobility: Supine to Sit     Supine to sit: Min assist Sit to supine: Min assist   General bed mobility comments: attempted to use L LE to assist R LE movement off of bed, pt still required min A for trunk support  Transfers Overall transfer level: Needs assistance Equipment used: Rolling walker (2 wheeled) Transfers: Sit to/from Stand Sit to Stand: Min guard         General transfer comment: cueing for safe hand placement  Ambulation/Gait Ambulation/Gait assistance: Supervision Gait Distance (Feet): 100 Feet Assistive device: Rolling walker (2 wheeled) Gait Pattern/deviations: Step-through pattern;Decreased stride length;Decreased step length - left;Decreased stance time - right Gait velocity: decreased   General Gait Details: pt steady overall with use of RW; accepting partial WB'ing through R LE; no LOB or need for physical assistance   Stairs Stairs: Yes Stairs assistance: Min assist Stair Management: No rails;One rail Left;Step to pattern;Backwards;Forwards;With walker Number of Stairs:  3(x2) General stair comments: pt initially ascending/descending stairs with L hand rail and 1HHA support from therapist to simulate indoor stairs at home; cueing needed for appropriate sequencing and technique. Pt then ascending/descending stairs backwards with use of RW; PT providing min A to stabilize anterior aspect of RW, cueing needed for sequencing and technique. Pt's daughter present throughout. PT provided handout as well.   Wheelchair Mobility    Modified Rankin (Stroke Patients Only)       Balance Overall balance assessment: Needs assistance Sitting-balance support: Feet supported;No upper extremity supported Sitting balance-Leahy Scale: Fair     Standing balance support: Bilateral upper extremity supported;During functional activity Standing balance-Leahy Scale: Poor Standing balance comment: Reliant on BUE support                             Cognition Arousal/Alertness: Awake/alert Behavior During Therapy: WFL for tasks assessed/performed Overall Cognitive Status: Within Functional Limits for tasks assessed                                        Exercises Total Joint Exercises Hip ABduction/ADduction: Standing;AROM;Strengthening;Right;10 reps Marching in Standing: AROM;Strengthening;Right;10 reps Standing Hip Extension: AROM;Strengthening;Right;10 reps    General Comments        Pertinent Vitals/Pain Pain Assessment: 0-10 Pain Score: 7  Pain Location: R hip  Pain Descriptors / Indicators: Aching;Operative site guarding;Grimacing;Guarding Pain Intervention(s): Monitored during session;Repositioned    Home Living  Prior Function            PT Goals (current goals can now be found in the care plan section) Acute Rehab PT Goals PT Goal Formulation: With patient Time For Goal Achievement: 12/04/19 Potential to Achieve Goals: Good Progress towards PT goals: Progressing toward goals     Frequency    7X/week      PT Plan Current plan remains appropriate    Co-evaluation              AM-PAC PT "6 Clicks" Mobility   Outcome Measure  Help needed turning from your back to your side while in a flat bed without using bedrails?: A Little Help needed moving from lying on your back to sitting on the side of a flat bed without using bedrails?: A Little Help needed moving to and from a bed to a chair (including a wheelchair)?: A Little Help needed standing up from a chair using your arms (e.g., wheelchair or bedside chair)?: None Help needed to walk in hospital room?: None Help needed climbing 3-5 steps with a railing? : A Little 6 Click Score: 20    End of Session Equipment Utilized During Treatment: Gait belt Activity Tolerance: Patient limited by pain Patient left: in bed;with call bell/phone within reach;with family/visitor present Nurse Communication: Mobility status PT Visit Diagnosis: Other abnormalities of gait and mobility (R26.89);Pain Pain - Right/Left: Right Pain - part of body: Hip     Time: 5170-0174 PT Time Calculation (min) (ACUTE ONLY): 35 min  Charges:  $Gait Training: 23-37 mins                    Anastasio Champion, DPT  Acute Rehabilitation Services Pager [phone] Office [place] 11/21/2019, 12:41 PM

## 2019-11-21 NOTE — Progress Notes (Signed)
Patient is discharged from room 3C11 at this time. Alert and in stable condition. IV site d/c'd and instructions read to patient with understanding verbalized and all questions answered. Left unit via wheelchair with all belongings at side. 

## 2019-11-21 NOTE — TOC Progression Note (Signed)
Transition of Care [facility]) - Progression Note    Patient Details  Name: Phillip Hester MRN: [id number] Date of Birth: 1956/02/08  [facility] of [facility]) CM/SW Person, RN Phone Number: [phone]  11/21/2019, 3:26 PM  Clinical Narrative:    Home health has been arranged with [facility].         Expected Discharge Plan and Services           Expected Discharge Date: 11/21/19                                     Social Determinants of Health (SDOH) Interventions    Readmission Risk Interventions No flowsheet data found.

## 2019-11-23 ENCOUNTER — Telehealth: Payer: Self-pay | Admitting: Orthopaedic Surgery

## 2019-11-23 NOTE — Anesthesia Postprocedure Evaluation (Signed)
Anesthesia Post Note  Patient: Phillip Hester  Procedure(s) Performed: RIGHT TOTAL HIP ARTHROPLASTY ANTERIOR APPROACH (Right Hip)     Patient location during evaluation: PACU Anesthesia Type: Spinal Level of consciousness: awake Pain management: pain level controlled Vital Signs Assessment: post-procedure vital signs reviewed and stable Respiratory status: spontaneous breathing Cardiovascular status: stable Postop Assessment: no headache, no backache, spinal receding, patient able to bend at knees and no apparent nausea or vomiting Anesthetic complications: no   No complications documented.  Last Vitals:  Vitals:   11/21/19 0421 11/21/19 0751  BP: 120/68 115/74  Pulse: 65 (!) 52  Resp: 20 16  Temp: 37.4 C 36.9 C  SpO2: 98% 96%    Last Pain:  Vitals:   11/21/19 1638  TempSrc:   PainSc: 7                  John F [address]

## 2019-11-23 NOTE — Telephone Encounter (Signed)
Pt aware this is ready at front desk

## 2019-11-23 NOTE — Telephone Encounter (Signed)
Patient called asked when will the handicap placard be ready for him to pick up. Patient said Dr Ninfa Linden told him he needed to come by the office to pick it up. The number to contact patient is [phone]

## 2019-11-26 ENCOUNTER — Other Ambulatory Visit: Payer: Self-pay

## 2019-11-26 ENCOUNTER — Emergency Department (HOSPITAL_COMMUNITY): Payer: BC Managed Care – PPO

## 2019-11-26 ENCOUNTER — Telehealth: Payer: Self-pay

## 2019-11-26 ENCOUNTER — Encounter (HOSPITAL_COMMUNITY): Payer: Self-pay | Admitting: Emergency Medicine

## 2019-11-26 ENCOUNTER — Telehealth: Payer: Self-pay | Admitting: *Deleted

## 2019-11-26 ENCOUNTER — Ambulatory Visit (HOSPITAL_BASED_OUTPATIENT_CLINIC_OR_DEPARTMENT_OTHER)
Admission: RE | Admit: 2019-11-26 | Discharge: 2019-11-26 | Disposition: A | Discharge status: Home / Self Care | Payer: BC Managed Care – PPO | Source: Ambulatory Visit | Attending: Physician Assistant | Admitting: Physician Assistant

## 2019-11-26 ENCOUNTER — Telehealth: Payer: Self-pay | Admitting: Radiology

## 2019-11-26 ENCOUNTER — Emergency Department (HOSPITAL_COMMUNITY)
Admission: EM | Admit: 2019-11-26 | Discharge: 2019-11-27 | Discharge status: Against Medical Advice | Payer: BC Managed Care – PPO | Source: Home / Self Care

## 2019-11-26 DIAGNOSIS — K219 Gastro-esophageal reflux disease without esophagitis: Secondary | ICD-10-CM | POA: Diagnosis not present

## 2019-11-26 DIAGNOSIS — M7989 Other specified soft tissue disorders: Secondary | ICD-10-CM

## 2019-11-26 DIAGNOSIS — Z79899 Other long term (current) drug therapy: Secondary | ICD-10-CM | POA: Diagnosis not present

## 2019-11-26 DIAGNOSIS — E7800 Pure hypercholesterolemia, unspecified: Secondary | ICD-10-CM | POA: Diagnosis not present

## 2019-11-26 DIAGNOSIS — I2699 Other pulmonary embolism without acute cor pulmonale: Secondary | ICD-10-CM | POA: Diagnosis not present

## 2019-11-26 DIAGNOSIS — R071 Chest pain on breathing: Secondary | ICD-10-CM | POA: Diagnosis not present

## 2019-11-26 DIAGNOSIS — Z96641 Presence of right artificial hip joint: Secondary | ICD-10-CM | POA: Diagnosis not present

## 2019-11-26 DIAGNOSIS — Z7982 Long term (current) use of aspirin: Secondary | ICD-10-CM | POA: Diagnosis not present

## 2019-11-26 DIAGNOSIS — Z8551 Personal history of malignant neoplasm of bladder: Secondary | ICD-10-CM | POA: Diagnosis not present

## 2019-11-26 DIAGNOSIS — E785 Hyperlipidemia, unspecified: Secondary | ICD-10-CM | POA: Diagnosis not present

## 2019-11-26 DIAGNOSIS — R0602 Shortness of breath: Secondary | ICD-10-CM | POA: Diagnosis present

## 2019-11-26 DIAGNOSIS — Z79890 Hormone replacement therapy (postmenopausal): Secondary | ICD-10-CM | POA: Diagnosis not present

## 2019-11-26 DIAGNOSIS — G4733 Obstructive sleep apnea (adult) (pediatric): Secondary | ICD-10-CM | POA: Diagnosis not present

## 2019-11-26 DIAGNOSIS — Z20822 Contact with and (suspected) exposure to covid-19: Secondary | ICD-10-CM | POA: Diagnosis not present

## 2019-11-26 LAB — CBC
HCT: 45.4 % (ref 39.0–52.0)
Hemoglobin: 14.6 g/dL (ref 13.0–17.0)
MCH: 29.7 pg (ref 26.0–34.0)
MCHC: 32.2 g/dL (ref 30.0–36.0)
MCV: 92.3 fL (ref 80.0–100.0)
Platelets: 285 10*3/uL (ref 150–400)
RBC: 4.92 MIL/uL (ref 4.22–5.81)
RDW: 13.6 % (ref 11.5–15.5)
WBC: 11.2 10*3/uL — ABNORMAL HIGH (ref 4.0–10.5)
nRBC: 0 % (ref 0.0–0.2)

## 2019-11-26 LAB — BASIC METABOLIC PANEL
Anion gap: 13 (ref 5–15)
BUN: 18 mg/dL (ref 8–23)
CO2: 26 mmol/L (ref 22–32)
Calcium: 8.6 mg/dL — ABNORMAL LOW (ref 8.9–10.3)
Chloride: 100 mmol/L (ref 98–111)
Creatinine, Ser: 1.12 mg/dL (ref 0.61–1.24)
GFR calc Af Amer: >60 mL/min (ref >60)
GFR calc non Af Amer: >60 mL/min (ref >60)
Glucose, Bld: 112 mg/dL — ABNORMAL HIGH (ref 70–99)
Potassium: 4.6 mmol/L (ref 3.5–5.1)
Sodium: 139 mmol/L (ref 135–145)

## 2019-11-26 LAB — TROPONIN I (HIGH SENSITIVITY)
Troponin I (High Sensitivity): 5 ng/L (ref <18)
Troponin I (High Sensitivity): 5 ng/L (ref <18)

## 2019-11-26 MED ORDER — SODIUM CHLORIDE 0.9% FLUSH: 3.0000 mL | Freq: Once | INTRAVENOUS | Status: DC

## 2019-11-26 NOTE — Telephone Encounter (Signed)
It is there now I apologize

## 2019-11-26 NOTE — ED Triage Notes (Signed)
Pt reports he had a right hip replacement on 6/8, since being discharged, pt c/o right sided chest pain, worse on inspiration.

## 2019-11-26 NOTE — Telephone Encounter (Signed)
Patient aware he may just walk in for CT per [place]

## 2019-11-26 NOTE — Progress Notes (Signed)
VASCULAR LAB PRELIMINARY  PRELIMINARY  PRELIMINARY  PRELIMINARY  Right lower extremity venous duplex completed.    Preliminary report:  See CV proc for preliminary results.  Called report to [facility]  Mauro Kaufmann, Joane Postel, RVT 11/26/2019, 4:43 PM

## 2019-11-26 NOTE — Telephone Encounter (Signed)
Patient's daughter came by to pick up the patient's handicap placard, but it was not in the bin.  Patient's daughter is going to stop by again this afternoon.  Thank you.

## 2019-11-26 NOTE — Telephone Encounter (Signed)
I called patient because [facility] could not get his CT-A Chest until tomorrow. He is having some chest pain with difficulty breathing. I advised he should go to ED and be evaluated for possible PE. Patient is going to [facility] ED now.

## 2019-11-26 NOTE — Telephone Encounter (Signed)
Candice from Vascular Lab called states doppler was neg for DVT.   Also patient would like for someone to call him with CT appt info.

## 2019-11-26 NOTE — Telephone Encounter (Signed)
Pt has appt scheduled today at the vascular lab at 4:00pm, pt is aware of appt

## 2019-11-27 ENCOUNTER — Encounter (HOSPITAL_COMMUNITY): Payer: Self-pay | Admitting: Emergency Medicine

## 2019-11-27 ENCOUNTER — Other Ambulatory Visit: Payer: Self-pay

## 2019-11-27 ENCOUNTER — Telehealth: Payer: Self-pay

## 2019-11-27 ENCOUNTER — Ambulatory Visit
Admission: RE | Admit: 2019-11-27 | Discharge: 2019-11-27 | Disposition: A | Discharge status: Home / Self Care | Payer: BC Managed Care – PPO | Source: Ambulatory Visit | Attending: Orthopaedic Surgery | Admitting: Orthopaedic Surgery

## 2019-11-27 ENCOUNTER — Telehealth: Payer: Self-pay | Admitting: Orthopaedic Surgery

## 2019-11-27 ENCOUNTER — Telehealth: Payer: Self-pay | Admitting: Radiology

## 2019-11-27 ENCOUNTER — Observation Stay (HOSPITAL_COMMUNITY)
Admission: EM | Admit: 2019-11-27 | Discharge: 2019-11-28 | Disposition: A | Discharge status: Home / Self Care | Payer: BC Managed Care – PPO | Attending: Family Medicine | Admitting: Family Medicine

## 2019-11-27 DIAGNOSIS — E7800 Pure hypercholesterolemia, unspecified: Secondary | ICD-10-CM | POA: Insufficient documentation

## 2019-11-27 DIAGNOSIS — E785 Hyperlipidemia, unspecified: Secondary | ICD-10-CM | POA: Insufficient documentation

## 2019-11-27 DIAGNOSIS — G4733 Obstructive sleep apnea (adult) (pediatric): Secondary | ICD-10-CM | POA: Insufficient documentation

## 2019-11-27 DIAGNOSIS — I2693 Single subsegmental pulmonary embolism without acute cor pulmonale: Secondary | ICD-10-CM

## 2019-11-27 DIAGNOSIS — Z20822 Contact with and (suspected) exposure to covid-19: Secondary | ICD-10-CM | POA: Insufficient documentation

## 2019-11-27 DIAGNOSIS — R0602 Shortness of breath: Secondary | ICD-10-CM | POA: Insufficient documentation

## 2019-11-27 DIAGNOSIS — Z79890 Hormone replacement therapy (postmenopausal): Secondary | ICD-10-CM | POA: Insufficient documentation

## 2019-11-27 DIAGNOSIS — Z79899 Other long term (current) drug therapy: Secondary | ICD-10-CM | POA: Insufficient documentation

## 2019-11-27 DIAGNOSIS — R071 Chest pain on breathing: Secondary | ICD-10-CM | POA: Insufficient documentation

## 2019-11-27 DIAGNOSIS — K219 Gastro-esophageal reflux disease without esophagitis: Secondary | ICD-10-CM | POA: Insufficient documentation

## 2019-11-27 DIAGNOSIS — Z9989 Dependence on other enabling machines and devices: Secondary | ICD-10-CM

## 2019-11-27 DIAGNOSIS — Z7982 Long term (current) use of aspirin: Secondary | ICD-10-CM | POA: Insufficient documentation

## 2019-11-27 DIAGNOSIS — Z8551 Personal history of malignant neoplasm of bladder: Secondary | ICD-10-CM | POA: Insufficient documentation

## 2019-11-27 DIAGNOSIS — I2699 Other pulmonary embolism without acute cor pulmonale: Principal | ICD-10-CM | POA: Diagnosis present

## 2019-11-27 DIAGNOSIS — Z96641 Presence of right artificial hip joint: Secondary | ICD-10-CM | POA: Insufficient documentation

## 2019-11-27 LAB — CBC
HCT: 45.3 % (ref 39.0–52.0)
Hemoglobin: 14.6 g/dL (ref 13.0–17.0)
MCH: 29.9 pg (ref 26.0–34.0)
MCHC: 32.2 g/dL (ref 30.0–36.0)
MCV: 92.6 fL (ref 80.0–100.0)
Platelets: 270 10*3/uL (ref 150–400)
RBC: 4.89 MIL/uL (ref 4.22–5.81)
RDW: 13.7 % (ref 11.5–15.5)
WBC: 9.5 10*3/uL (ref 4.0–10.5)
nRBC: 0 % (ref 0.0–0.2)

## 2019-11-27 LAB — BASIC METABOLIC PANEL
Anion gap: 11 (ref 5–15)
BUN: 18 mg/dL (ref 8–23)
CO2: 27 mmol/L (ref 22–32)
Calcium: 8.8 mg/dL — ABNORMAL LOW (ref 8.9–10.3)
Chloride: 100 mmol/L (ref 98–111)
Creatinine, Ser: 1.08 mg/dL (ref 0.61–1.24)
GFR calc Af Amer: >60 mL/min (ref >60)
GFR calc non Af Amer: >60 mL/min (ref >60)
Glucose, Bld: 106 mg/dL — ABNORMAL HIGH (ref 70–99)
Potassium: 4.7 mmol/L (ref 3.5–5.1)
Sodium: 138 mmol/L (ref 135–145)

## 2019-11-27 MED ORDER — HEPARIN BOLUS VIA INFUSION
7500.0000 [IU] | Freq: Once | INTRAVENOUS | Status: AC
  Administered 2019-11-27: 7500 [IU] via INTRAVENOUS
  Filled 2019-11-27: qty 7500

## 2019-11-27 MED ORDER — ONDANSETRON HCL 4 MG PO TABS: 4.0000 mg | ORAL_TABLET | Freq: Four times a day (QID) | ORAL | Status: DC | PRN

## 2019-11-27 MED ORDER — IOPAMIDOL (ISOVUE-370) INJECTION 76%
75.0000 mL | Freq: Once | INTRAVENOUS | Status: AC | PRN
  Administered 2019-11-27: 75 mL via INTRAVENOUS

## 2019-11-27 MED ORDER — FAMOTIDINE 20 MG PO TABS
10.0000 mg | ORAL_TABLET | Freq: Every day | ORAL | Status: DC
  Administered 2019-11-27 – 2019-11-28 (×2): 10 mg via ORAL
  Filled 2019-11-27 (×2): qty 1

## 2019-11-27 MED ORDER — ACETAMINOPHEN 325 MG PO TABS: 650.0000 mg | ORAL_TABLET | Freq: Four times a day (QID) | ORAL | Status: DC | PRN

## 2019-11-27 MED ORDER — LEVOTHYROXINE SODIUM 25 MCG PO TABS
25.0000 ug | ORAL_TABLET | Freq: Every day | ORAL | Status: DC
  Filled 2019-11-27: qty 1

## 2019-11-27 MED ORDER — TRAMADOL HCL 50 MG PO TABS
50.0000 mg | ORAL_TABLET | Freq: Every day | ORAL | Status: DC | PRN
  Administered 2019-11-28 (×2): 100 mg via ORAL
  Filled 2019-11-27 (×2): qty 2

## 2019-11-27 MED ORDER — METHOCARBAMOL 500 MG PO TABS: 500.0000 mg | ORAL_TABLET | Freq: Four times a day (QID) | ORAL | Status: DC | PRN

## 2019-11-27 MED ORDER — HEPARIN (PORCINE) 25000 UT/250ML-% IV SOLN
1800.0000 [IU]/h | INTRAVENOUS | Status: DC
  Administered 2019-11-27 – 2019-11-28 (×2): 1800 [IU]/h via INTRAVENOUS
  Filled 2019-11-27 (×2): qty 250

## 2019-11-27 MED ORDER — ACETAMINOPHEN 650 MG RE SUPP: 650.0000 mg | Freq: Four times a day (QID) | RECTAL | Status: DC | PRN

## 2019-11-27 MED ORDER — ONDANSETRON HCL 4 MG/2ML IJ SOLN: 4.0000 mg | Freq: Four times a day (QID) | INTRAMUSCULAR | Status: DC | PRN

## 2019-11-27 MED ORDER — MELATONIN 5 MG PO TABS
5.0000 mg | ORAL_TABLET | Freq: Every day | ORAL | Status: DC
  Administered 2019-11-28: 10 mg via ORAL
  Filled 2019-11-27: qty 2

## 2019-11-27 MED ORDER — ACETAMINOPHEN 650 MG RE SUPP: 650.0000 mg | Freq: Four times a day (QID) | RECTAL | Status: DC

## 2019-11-27 MED ORDER — POLYETHYLENE GLYCOL 3350 17 G PO PACK
17.0000 g | PACK | Freq: Every day | ORAL | Status: DC
  Administered 2019-11-28: 17 g via ORAL
  Filled 2019-11-27: qty 1

## 2019-11-27 MED ORDER — ACETAMINOPHEN 325 MG PO TABS
650.0000 mg | ORAL_TABLET | Freq: Four times a day (QID) | ORAL | Status: DC
  Administered 2019-11-28 (×2): 650 mg via ORAL
  Filled 2019-11-27 (×3): qty 2

## 2019-11-27 NOTE — ED Notes (Addendum)
Pt not found in waiting room. Pt called with no answer.

## 2019-11-27 NOTE — Telephone Encounter (Signed)
Patient called.   Phillip Hester he was returning a call from [place].  Call back: [phone]

## 2019-11-27 NOTE — Telephone Encounter (Signed)
Per arena with [facility], pt is coming in today as a walkin for CT

## 2019-11-27 NOTE — ED Triage Notes (Signed)
Patient arrives to ED with complaints of a confirmed right lower lobe PE on his CTA today. Patient had right hip replacement 6/8. Since then patient had been SOB and states he still get SOB when he breathes deep today.

## 2019-11-27 NOTE — Telephone Encounter (Signed)
Advised/Aware patient is being sent from [place] for admitting due to positive PE per Benita Stabile, PA-C.

## 2019-11-27 NOTE — Telephone Encounter (Signed)
Pt would like a CB in regards to him having to go to the ER.  [phone]

## 2019-11-27 NOTE — Telephone Encounter (Signed)
Patient called. He would like Caryl Pina to call him. Have some breathing issues. His call back number is [phone]

## 2019-11-27 NOTE — Telephone Encounter (Signed)
Contact [facility]

## 2019-11-27 NOTE — Telephone Encounter (Signed)
Tried calling patient, no answer.

## 2019-11-27 NOTE — Progress Notes (Signed)
[facility] for Heparin Indication: pulmonary embolus  Allergies  Allergen Reactions  . Bee Venom Anaphylaxis  . Prednisone     REACTION: "drove me nutty"    Patient Measurements: Height: 6\' 3"  (190.5 cm) Weight: 113.4 kg (250 lb) IBW/kg (Calculated) : 84.5 Heparin Dosing Weight: 108 kg  Vital Signs: Temp: 98.6 F (37 C) (06/15 1949) Temp Source: Oral (06/15 1949) BP: 142/79 (06/15 1949) Pulse Rate: 78 (06/15 1949)  Labs: Recent Labs    11/26/19 1750 11/26/19 2013 11/27/19 1622  HGB 14.6  --  14.6  HCT 45.4  --  45.3  PLT 285  --  270  CREATININE 1.12  --  1.08  TROPONINIHS 5 5  --     Estimated Creatinine Clearance: 96.4 mL/min (by C-G formula based on SCr of 1.08 mg/dL).   Medical History: Past Medical History:  Diagnosis Date  . Arthritis    knees  . Cancer [facility])    bladder cancer  . GERD (gastroesophageal reflux disease)   . Heart murmur    diagnosed with "athlete's murmur" at 17  . History of kidney stones   . Hyperlipidemia   . Pure hypercholesterolemia   . Sleep apnea    CPAP at night    Medications:  Scheduled:  . heparin  7,500 Units Intravenous Once    Assessment: Patient is a 77 yom that was fount to have right lower lobe segmental PE. Pharmacy has been asked to dose heparin at this time.  Goal of Therapy:  Heparin level 0.3-0.7 units/ml Monitor platelets by anticoagulation protocol: Yes   Plan:  - Heparin bolus 7500 units IV x 1 dose - Heparin drip @ 1800 units/hr - Heparin level in ~ 6 hours  - Monitor patient for s/s of bleeding and CBC while on heparin   Duanne Limerick PharmD. BCPS  11/27/2019,10:02 PM

## 2019-11-27 NOTE — H&P (Addendum)
[facility] Admission History and Physical Service Pager: [phone]  [name] Medical record number: [id number] Date of birth: 10-10-1955 Age: 64 y.o. Gender: male  Primary Care Provider: Patient, No Pcp Per Consultants: None Code Status: Full Emergency Contact: Gwynn Crossley [phone]  Chief Complaint: SOB, RLE swelling  Assessment and Plan: Phillip Hester is a 64 y.o. male presenting with one day history of RLE swelling and SOB and found to have right lower lobe PE. PMH is significant for arthritis, bladder cancer (2016), GERD, HLD, OSA, low testosterone  SOB, Pleuritic CP 2/2 provoked Pulmonary Embolism in setting of recent Hip replacement: Patient presenting with one day history of RLE swelling, SOB, and pleuritic chest pain and found to have right lower lobe segmental emboli without right heart strain on outpatient CTA. RLE doppler from 6/14 was negative for DVT. This would be a provoked PE in setting of total right hip replacement on 11/20/19 by orthopedics without complication. No history of PE/DVT. No personal or family history of clotting disorder. No history of acute bleed. He has a distant history of bladder cancer in 2016 with negative imaging for metastasis. Repeat colposcopy in April 2021 that was negative for recurrence. EKG with NSR and chronic q-waves and t-wave inversions in lead III, otherwise unremarkable. On admission, patient was hemodynamically stable on room air with no respiratory distress. Will start patient on heparin drip tonight with plan to convert to [place] tomorrow for at least a 3 month duration. Physical exam notable for large ecchymosis extending from right hip to proximal posterior knee (see photo below).  - admit to [facility], attending Dr. Gwendlyn Deutscher - IV heparin per pharmacy - plan to transition to [place] in AM - consider echo although no signs of right heart strain on CTA - PT/OT - incentive spirometer given CT  findings of atelectasis and recent surgery - vitals per routine, cardiac monitoring, continuous pulse ox - monitor for worsening ecchymosis/development of hemoatoma  Recent Right Hip Replacement: Patient is s/p total right hip arthroplasty on 11/19/09 with complications. CTA notable for signs of atelectasis. He currently ambulates with a walker daily and has home physical therapy 3x/week. Physical exam notable for Large ecchymosis extending from right hip to proximal posterior knee. - incentive spirometer q2 hours while awake - up with assistance - PT/OT  - continue Robaxin 500mg  q6 PRN - Tramadol 50-100mg  q6 hours PRN - Tylenol PRN  - daily Miralax  OSA: Uses CPAP qHS. - continue home CPAP qHS  HLD: History of hyperlipidemia. Last lipid panel in 2014 with elevated LDL. History of taking simvastatin but self discontinued. No current cholesterol lowering medications. - obtain lipid panel  History of Low Energy on Testosterone and low dose Synthroid: Last TSH and free/total T4 WNL in March 2021. Per chart review patient was started on low dose levothyroxine by urologist on 11/05/19 to help improve his low energy state. Home meds: Synthroid 26mcg QD and SQ Testosterone injections weekly. Endorses compliance. - continue home Synthroid - continue Testosterone outpatient  GERD: Home meds: OTC Omeprazole PRN -  Daily Pepcid  History of Bladder Cancer: Patient has history of bladder cancer s/p Ttansurethral resection of bladder tumor on 04/22/2015. He had a repeat cystoscopy in 09/24/19 that was negative for any lesions.  FEN/GI: Regular diet Prophylaxis: Heparin drip  Disposition: likely discharge 6/16  History of Present Illness:  Phillip Hester is a 64 y.o. male presenting with one day history of RLE swelling, SOB, and pleuritic chest pain.  He is s/p total right hip replacement on 11/20/19. He reported symptoms to orthopedic surgeon who ordered outpatient LE doppler and CTA. He received  the RLE doppler yesterday that was negative for DVT. He was unable to obtain CTA at [facility]. He presented to ED for CTA but waited in waiting area for 5 hours and opted to leave. He presented to [facility] imaging today for CTA and was notified shortly after that it was positive for Right sided PE. At this time, he presented to ED for admission.  He denies any history of blood clots. Denies any history of bleeding. Denies any personal or family history of clotting disorders. Denies any chest pain, cough, congestion, abdominal pain. Mild constipation in setting of opioid use for pain control. Last bowel movement this morning.  Denies tobacco use.  Alcohol: Drinks 2-3 glasses of bourbon/vodka  Illicit Drugs: none  Review Of Systems: Per HPI with the following additions:   Review of Systems  Constitutional: Negative for chills, diaphoresis and fever.  HENT: Negative for congestion and sore throat.   Respiratory: Positive for cough and shortness of breath. Negative for hemoptysis, sputum production and wheezing.   Cardiovascular: Positive for chest pain (pleuritic ) and leg swelling. Negative for orthopnea, claudication and PND.  Gastrointestinal: Negative for abdominal pain, constipation, diarrhea, nausea and vomiting.  Genitourinary: Negative for dysuria and hematuria.  Musculoskeletal: Positive for joint pain (right hip after surgery on 6/8).    Patient Active Problem List   Diagnosis Date Noted  . Pulmonary embolism ([place]) 11/27/2019  . Status post total replacement of right hip 11/20/2019  . Unilateral primary osteoarthritis, right hip 07/18/2019  . Cardiovascular risk factor 12/13/2010  . Hyperlipidemia 10/18/2008  . OBSTRUCTIVE SLEEP APNEA 10/18/2008  . SNORING 10/18/2008    Past Medical History: Past Medical History:  Diagnosis Date  . Arthritis    knees  . Cancer [facility])    bladder cancer  . GERD (gastroesophageal reflux disease)   . Heart murmur    diagnosed with  "athlete's murmur" at 17  . History of kidney stones   . Hyperlipidemia   . Pure hypercholesterolemia   . Sleep apnea    CPAP at night    Past Surgical History: Past Surgical History:  Procedure Laterality Date  . INGUINAL HERNIA REPAIR    . IRRIGATION AND DEBRIDEMENT SEBACEOUS CYST    . KNEE SURGERY Left 2013  . SHOULDER SURGERY Right 2013  . TOTAL HIP ARTHROPLASTY Right 11/20/2019   Procedure: RIGHT TOTAL HIP ARTHROPLASTY ANTERIOR APPROACH;  Surgeon: Mcarthur Rossetti, MD;  Location: [facility];  Service: Orthopedics;  Laterality: Right;  . WISDOM TOOTH EXTRACTION      Social History: Social History   Tobacco Use  . Smoking status: Never Smoker  . Smokeless tobacco: Never Used  Vaping Use  . Vaping Use: Never used  Substance Use Topics  . Alcohol use: Yes    Alcohol/week: 2.0 - 3.0 standard drinks    Types: 2 - 3 Standard drinks or equivalent per week    Comment: 2-3 drinks weekly  . Drug use: No   Additional social history:  Please also refer to relevant sections of EMR.  Family History: Family History  Problem Relation Age of Onset  . Arthritis Mother   . Dementia Father   . Cancer Maternal Grandfather   . Cancer Paternal Grandfather     Allergies and Medications: Allergies  Allergen Reactions  . Bee Venom Anaphylaxis  . Prednisone  REACTION: "drove me nutty"   No current facility-administered medications on file prior to encounter.   Current Outpatient Medications on File Prior to Encounter  Medication Sig Dispense Refill  . Ascorbic Acid (VITAMIN C) 1000 MG tablet Take 3,000 mg by mouth daily.     Marland Kitchen aspirin 81 MG chewable tablet Chew 1 tablet (81 mg total) by mouth 2 (two) times daily. 30 tablet 0  . b complex vitamins tablet Take 2-3 tablets by mouth daily.    . Cholecalciferol (VITAMIN D3) 25 MCG (1000 UT) CAPS Take 3,000 Units by mouth daily.    . Cyanocobalamin (VITAMIN B-12) 5000 MCG TBDP Take 5,000-10,000 mg by mouth daily.    . Flaxseed,  Linseed, (FLAX SEED OIL PO) Take 1,400-2,800 mg by mouth daily.    Marland Kitchen levothyroxine (SYNTHROID) 25 MCG tablet Take 25 mcg by mouth daily.    . Melatonin 10 MG TABS Take 10-20 mg by mouth at bedtime.    . methocarbamol (ROBAXIN) 500 MG tablet Take 1 tablet (500 mg total) by mouth every 6 (six) hours as needed for muscle spasms. 60 tablet 1  . Multiple Vitamin (MULTIVITAMIN) tablet Take 1 tablet by mouth daily. Multi Genic without Iron    . Nutritional Supplements (DHEA PO) Take 75 mg by mouth daily.    . ondansetron (ZOFRAN ODT) 4 MG disintegrating tablet Take 1 tablet (4 mg total) by mouth every 8 (eight) hours as needed for nausea or vomiting. 20 tablet 0  . OVER THE COUNTER MEDICATION Apply 1 application topically daily as needed (Pain). DMSO cream    . oxyCODONE (OXY IR/ROXICODONE) 5 MG immediate release tablet Take 1-2 tablets (5-10 mg total) by mouth every 4 (four) hours as needed for moderate pain (pain score 4-6). 30 tablet 0  . Testosterone Enanthate (XYOSTED) 50 MG/0.5ML SOAJ Inject 50 mg as directed once a week.    . traMADol (ULTRAM) 50 MG tablet TAKE 1 TO 2 TABLETS(50 TO 100 MG) BY MOUTH EVERY 6 HOURS AS NEEDED (Patient taking differently: Take 50-100 mg by mouth daily as needed for moderate pain. ) 40 tablet 0  . zinc gluconate 50 MG tablet Take 100 mg by mouth daily.    Marland Kitchen omeprazole (PRILOSEC) 20 MG capsule Take 20 mg by mouth daily as needed (reflux). (Patient not taking: Reported on 11/27/2019)      Objective: BP (!) 145/80 (BP Location: Right Arm)   Pulse 74   Temp 98.6 F (37 C) (Oral)   Resp 18   Ht 6\' 3"  (1.905 m)   Wt 113.4 kg   SpO2 95%   BMI 31.25 kg/m  Exam: General: pleasant older male, lying comfortably in ED bed with daughter at bed side, well nourished, well developed, in no acute distress with non-toxic appearance CV: regular rate and rhythm without murmurs, rubs, or gallops, Trace to 1+ pitting edema to RLE, no pitting edema to LLE Lungs: clear to auscultation  bilaterally with normal work of breathing Abdomen: soft, non-tender, non-distended, normoactive bowel sounds Skin: warm, dry, Large ecchymosis extending from right lateral hip to just distal to posterior knee Extremities: warm and well perfused, 1cm difference in calf size on R vs L Neuro: Alert and oriented, speech normal      Labs and Imaging: CBC BMET  Recent Labs  Lab 11/27/19 1622  WBC 9.5  HGB 14.6  HCT 45.3  PLT 270   Recent Labs  Lab 11/27/19 1622  NA 138  K 4.7  CL 100  CO2 27  BUN 18  CREATININE 1.08  GLUCOSE 106*  CALCIUM 8.8*     CT ANGIO CHEST PE W OR WO CONTRAST  Result Date: 11/27/2019 CLINICAL DATA:  Postop hip replacement 11/22/2019, pain with inspiration, history of bladder cancer EXAM: CT ANGIOGRAPHY CHEST WITH CONTRAST TECHNIQUE: Multidetector CT imaging of the chest was performed using the standard protocol during bolus administration of intravenous contrast. Multiplanar CT image reconstructions and MIPs were obtained to evaluate the vascular anatomy. CONTRAST:  46mL ISOVUE-370 IOPAMIDOL (ISOVUE-370) INJECTION 76% COMPARISON:  11/26/2019 FINDINGS: Cardiovascular: This is a technically adequate evaluation of the pulmonary vasculature. Pulmonary emboli are seen within the posterior and lateral segmental branches of the right lower lobe pulmonary artery. Minimal clot burden. No evidence of right heart strain. The heart is unremarkable without pericardial effusion. Thoracic aorta is normal in caliber with no aneurysm or dissection. Mediastinum/Nodes: No enlarged mediastinal, hilar, or axillary lymph nodes. Thyroid gland, trachea, and esophagus demonstrate no significant findings. Lungs/Pleura: There are patchy areas of consolidation within the right lower lobe. Consolidation in the lateral basilar segment could reflect pulmonary infarct. Consolidation in the medial basilar segment likely reflects atelectasis. There is a trace right pleural effusion. Minimal  hypoventilatory changes are seen within the left lower lobe. No pneumothorax. Central airways are patent. Upper Abdomen: Renal cysts are incidentally noted. Remainder of the upper abdomen is unremarkable. Musculoskeletal: No acute or destructive bony lesions. Reconstructed images demonstrate no additional findings. Review of the MIP images confirms the above findings. IMPRESSION: 1. Right lower lobe segmental pulmonary emboli. No evidence of right heart strain. Minimal clot burden. 2. Patchy consolidation at the lung bases. Consolidation within the lateral lobe basilar likely reflects infarct segment right lower. Other areas likely reflect atelectasis. 3. Trace right pleural effusion. These results were called by telephone at the time of interpretation on 11/27/2019 at 3:23 pm to provider Lindell Spar, PA, who verbally acknowledged these results. Electronically Signed   By: Randa Ngo M.D.   On: 11/27/2019 15:30   Danna Hefty, DO 11/27/2019, 10:47 PM PGY-2, [place] Intern pager: [phone], text pages welcome

## 2019-11-27 NOTE — Telephone Encounter (Signed)
Patient still having a chest pain feel, went to the ER lastnight but was there for many hours and left Does look like he had an xray He is going here soon to have a CT to r/o PE, just wanted to [place] you so we can keep a lookout on him today

## 2019-11-28 DIAGNOSIS — I2699 Other pulmonary embolism without acute cor pulmonale: Secondary | ICD-10-CM

## 2019-11-28 DIAGNOSIS — E785 Hyperlipidemia, unspecified: Secondary | ICD-10-CM | POA: Diagnosis not present

## 2019-11-28 LAB — COMPREHENSIVE METABOLIC PANEL WITH GFR
ALT: 75 U/L — ABNORMAL HIGH (ref 0–44)
AST: 51 U/L — ABNORMAL HIGH (ref 15–41)
Albumin: 2.6 g/dL — ABNORMAL LOW (ref 3.5–5.0)
Alkaline Phosphatase: 79 U/L (ref 38–126)
Anion gap: 7 (ref 5–15)
BUN: 20 mg/dL (ref 8–23)
CO2: 26 mmol/L (ref 22–32)
Calcium: 8.4 mg/dL — ABNORMAL LOW (ref 8.9–10.3)
Chloride: 106 mmol/L (ref 98–111)
Creatinine, Ser: 1.07 mg/dL (ref 0.61–1.24)
GFR calc Af Amer: >60 mL/min
GFR calc non Af Amer: >60 mL/min
Glucose, Bld: 113 mg/dL — ABNORMAL HIGH (ref 70–99)
Potassium: 4.5 mmol/L (ref 3.5–5.1)
Sodium: 139 mmol/L (ref 135–145)
Total Bilirubin: 0.6 mg/dL (ref 0.3–1.2)
Total Protein: 5.9 g/dL — ABNORMAL LOW (ref 6.5–8.1)

## 2019-11-28 LAB — LIPID PANEL
Cholesterol: 203 mg/dL — ABNORMAL HIGH (ref 0–200)
HDL: 28 mg/dL — ABNORMAL LOW (ref >40)
LDL Cholesterol: 152 mg/dL — ABNORMAL HIGH (ref 0–99)
Total CHOL/HDL Ratio: 7.3 RATIO
Triglycerides: 116 mg/dL (ref <150)
VLDL: 23 mg/dL (ref 0–40)

## 2019-11-28 LAB — CBC
HCT: 42.6 % (ref 39.0–52.0)
Hemoglobin: 14 g/dL (ref 13.0–17.0)
MCH: 30.2 pg (ref 26.0–34.0)
MCHC: 32.9 g/dL (ref 30.0–36.0)
MCV: 91.8 fL (ref 80.0–100.0)
Platelets: 302 10*3/uL (ref 150–400)
RBC: 4.64 MIL/uL (ref 4.22–5.81)
RDW: 13.8 % (ref 11.5–15.5)
WBC: 8.2 10*3/uL (ref 4.0–10.5)
nRBC: 0 % (ref 0.0–0.2)

## 2019-11-28 LAB — PROTIME-INR
INR: 1.1 (ref 0.8–1.2)
Prothrombin Time: 14 seconds (ref 11.4–15.2)

## 2019-11-28 LAB — HIV ANTIBODY (ROUTINE TESTING W REFLEX): HIV Screen 4th Generation wRfx: NONREACTIVE

## 2019-11-28 LAB — HEPARIN LEVEL (UNFRACTIONATED): Heparin Unfractionated: 0.39 [IU]/mL (ref 0.30–0.70)

## 2019-11-28 LAB — SARS CORONAVIRUS 2 BY RT PCR (HOSPITAL ORDER, PERFORMED IN ~~LOC~~ HOSPITAL LAB): SARS Coronavirus 2: NEGATIVE

## 2019-11-28 MED ORDER — APIXABAN (ELIQUIS) VTE STARTER PACK (10MG AND 5MG)
ORAL_TABLET | ORAL | 0 refills | Status: DC
Start: 2019-11-28 — End: 2019-12-24

## 2019-11-28 MED ORDER — APIXABAN 5 MG PO TABS
10.0000 mg | ORAL_TABLET | Freq: Two times a day (BID) | ORAL | Status: DC
  Administered 2019-11-28: 10 mg via ORAL
  Filled 2019-11-28: qty 2

## 2019-11-28 MED ORDER — TRAMADOL HCL 50 MG PO TABS: 50.0000 mg | ORAL_TABLET | Freq: Every day | ORAL | 0 refills | Status: DC | PRN

## 2019-11-28 MED FILL — ELIQUIS STARTER PACK 5 MG T: 5 | 30 days supply | Qty: 74 | Fill #0

## 2019-11-28 NOTE — TOC Benefit Eligibility Note (Signed)
Transition of Care [facility]) Benefit Eligibility Note    Patient Details  Name: Tage Feggins MRN: [id number] Date of Birth: 01-14-1956   Medication/Dose: Eliquis 5mg . bid 30 day supply  Covered?: Yes  Tier: 3 Drug  Prescription Coverage [facility]: [facility]  Spoke with Person/Company/Phone Number:: Bethena Roys C. W/[employer].PH# [phone]  Co-Pay: Zero  Co-pay  Prior Approval: No  Deductible: Unmet       Shelda Altes Phone Number: 11/28/2019, 4:14 PM

## 2019-11-28 NOTE — Progress Notes (Signed)
Transitions of Care Pharmacist Note  Phillip Hester is a 64 y.o. male that has been diagnosed with PE and will be prescribed Eliquis (apixaban) at discharge.   Patient Education: I provided the following education on Eliquis (apixaban) to the patient: How to take the medication Described what the medication is Signs of bleeding Signs/symptoms of VTE and stroke  Answered their questions  Discharge Medications Plan: The patient wants to have their discharge medications filled by the Transitions of Care pharmacy rather than their usual pharmacy.  The discharge orders pharmacy has been changed to the Transitions of Care pharmacy, the patient will receive a phone call regarding co-pay, and their medications will be delivered by the Transitions of Care pharmacy.    Thank you,   Acey Lav, PharmD  PGY1 Acute Care Pharmacy Resident  November 28, 2019

## 2019-11-28 NOTE — Plan of Care (Signed)
  Problem: Consults Goal: Venous Thromboembolism Patient Education Description: See Patient Education Module for education specifics. Outcome: Progressing Goal: Diagnosis - Venous Thromboembolism (VTE) Description: Choose a selection Outcome: Progressing Goal: Pharmacy Consult for anticoagulation Outcome: Progressing Goal: Skin Care Protocol Initiated - if Braden Score 18 or less Description: If consults are not indicated, leave blank or document N/A Outcome: Progressing Goal: Nutrition Consult-if indicated Outcome: Progressing Goal: Diabetes Guidelines if Diabetic/Glucose > 140 Description: If diabetic or lab glucose is > 140 mg/dl - Initiate Diabetes/Hyperglycemia Guidelines & Document Interventions  Outcome: Progressing   Problem: Phase I Progression Outcomes Goal: Pain controlled with appropriate interventions Outcome: Progressing Goal: Dyspnea controlled at rest (PE) Outcome: Progressing Goal: Tolerating diet Outcome: Progressing Goal: Initial discharge plan identified Outcome: Progressing Goal: Voiding-avoid urinary catheter unless indicated Outcome: Progressing Goal: Hemodynamically stable Outcome: Progressing Goal: Other Phase I Outcomes/Goals Outcome: Progressing   Problem: Phase II Progression Outcomes Goal: Therapeutic drug levels for anticoagulation Outcome: Progressing Goal: 02 sats trending upward/stable (PE) Outcome: Progressing Goal: Discharge plan established Outcome: Progressing Goal: Tolerating diet Outcome: Progressing Goal: Other Phase II Outcomes/Goals Outcome: Progressing   Problem: Phase III Progression Outcomes Goal: 02 sats stabilized Outcome: Progressing Goal: Activity at appropriate level-compared to baseline Description: (UP IN CHAIR FOR HEMODIALYSIS) Outcome: Progressing Goal: Discharge plan remains appropriate-arrangements made Outcome: Progressing Goal: Other Phase III Outcomes/Goals Outcome: Progressing   Problem: Discharge  Progression Outcomes Goal: Barriers To Progression Addressed/Resolved Outcome: Progressing Goal: Discharge plan in place and appropriate Outcome: Progressing Goal: Pain controlled with appropriate interventions Outcome: Progressing Goal: Hemodynamically stable Outcome: Progressing Goal: Complications resolved/controlled Outcome: Progressing Goal: Tolerating diet Outcome: Progressing Goal: Activity appropriate for discharge plan Outcome: Progressing Goal: Other Discharge Outcomes/Goals Outcome: Progressing   

## 2019-11-28 NOTE — Progress Notes (Signed)
PT Cancellation Note  Patient Details Name: Phillip Hester MRN: [id number] DOB: 1956/05/29   Cancelled Treatment:    Reason Eval/Treat Not Completed: Medical issues which prohibited therapy Pt admitted with PE and received heparin around 2200 last night.  Will hold PT today for 24 hours of heparin per PT guidelines.  Will f/u at later date.  Abran Richard, PT Acute Rehab Services Pager [phone] [facility] [place] 11/28/2019, 8:51 AM

## 2019-11-28 NOTE — Progress Notes (Signed)
[place] for Heparin Indication: pulmonary embolus  Assessment: Patient is a 89 yom that was fount to have right lower lobe segmental PE. Pharmacy has been asked to dose heparin at this time.  Initial heparin level 0.39 units/ml Goal of Therapy:  Heparin level 0.3-0.7 units/ml Monitor platelets by anticoagulation protocol: Yes   Plan:  - Cont heparin drip @ 1800 units/hr - Heparin level in ~ 6 hours to confirm - Monitor patient for s/s of bleeding and CBC while on heparin   Thanks for allowing pharmacy to be a part of this patient's care.  Excell Seltzer, PharmD Clinical Pharmacist 11/28/2019,5:49 AM

## 2019-11-28 NOTE — TOC Transition Note (Signed)
Transition of Care [facility]) - CM/SW Discharge Note   Patient Details  Name: Phillip Hester MRN: [id number] Date of Birth: [birth year]  [facility] CM/SW Contact:  Pollie Friar, RN Phone Number: 11/28/2019, 3:40 PM   Clinical Narrative:    Pt discharging home with resumption of [place] services through [facility]. CM updated Tiffany with [facility] on resumption orders and d/c home. Pt received eliquis for home from [place]. CM will do a benefits check and call him tomorrow with the cost after the 30 days. He can follow up with PCP if cost too expensive.  Pt has supervision at home and transportation to home.    Final next level of care: [place] Barriers to Discharge: No Barriers Identified   Patient Goals and CMS Choice        Discharge Placement                       Discharge Plan and Services                          HH Arranged: PT [place] Agency: [facility] (now [facility]) Date [place]: 11/28/19   Representative spoke with at [place]: [place] ([place]) Interventions     Readmission Risk Interventions No flowsheet data found.

## 2019-11-28 NOTE — Progress Notes (Signed)
Family Medicine Teaching Service Daily Progress Note Intern Pager: [phone]  [name] Medical record number: [id number] Date of birth: 01-04-1956 Age: 64 y.o. Gender: male  Primary Care Provider: Patient, No Pcp Per Consultants: None  Code Status: Full  Pt Overview and Major Events to Date:  11/27/19: admitted, heparin gtt   Assessment and Plan: Phillip Hester is a 64 y.o. male presenting with one day history of RLE swelling and SOB and found to have right lower lobe PE. PMH is significant for arthritis, bladder cancer (2016), GERD, HLD, OSA, low testosterone  Pulmonary Embolism Patient presented with shortness breath, pleuritic chest pain and RLE swelling for the past day prior to arrival..  Had outpatient right LE doppler that was negative and a CTA and was found to have right lower lobe segmental emboli without right heart strain on outpatient CTA. PE considered provoked in setting of total right hip replacement (uncomplicated) on 11/15/82. No prior PE/DVT nor personal or family history of clotting disorder. He had  bladder cancer in 2016 with repeat colposcopy in April 2021 that was negative. Patient on heparin drip overnight. Will transition to [place] today for at least a 3 month duration.  - Start Eliquis  -Discontinue heparin gtt - PT/OT: home health PT  - Continue incentive spirometer  - Continue cardiopulmomary monitoring   Recent Right Hip Replacement Patient is s/p total right hip arthroplasty on 11/17/57 with complications. He currently ambulates with a walker daily and has home physical therapy 3x/week.On admission, pt had large ecchymosis extending from right hip to proximal posterior knee. - incentive spirometer q2 hours while awake - Monitor for development of hematoma  - up with assistance - PT/OT  - continue Robaxin 500mg  q6 PRN - Tramadol 50-100mg  q6 hours PRN - Tylenol PRN  - daily Miralax  OSA Uses CPAP qHS. - continue home CPAP  qHS  Hyperlipidemia  Last lipid panel in 2014 with elevated LDL 126. PT self discontinued Simvastatin.Takes no meds now.  - obtain lipid panel     Component Value Date/Time   CHOL 229 (H) 05/31/2013 1542   TRIG 278 (H) 05/31/2013 1542   HDL 47 05/31/2013 1542   CHOLHDL 4 02/24/2011 0910   VLDL 23.0 02/24/2011 0910   LDLCALC 126 (H) 05/31/2013 1542   LDLDIRECT 179.5 12/11/2010 1130     History of Low Energy on Testosterone and low dose Synthroid Last TSH and free/total T4 WNL in March 2021. Per chart review patient was started on low dose levothyroxine by urologist on 11/05/19 to help improve his low energy state. Home meds: Synthroid 50mcg QD and SQ Testosterone injections weekly. Endorses compliance. - continue home Synthroid - continue Testosterone outpatient  GERD Home meds: OTC Omeprazole PRN -  Daily Pepcid   History of Bladder Cancer Patient has history of bladder cancer s/p Transurethral resection of bladder tumor on 04/22/2015. He had a repeat cystoscopy in 09/24/19 that was negative for any lesions.  FEN/GI: Regular diet Prophylaxis: Heparin drip    Disposition: Home today   Subjective:  Patient is anxious this morning.  Denies chest pain chest discomfort and shortness of breath.  Objective: Temp:  [98.2 F (36.8 C)-99.3 F (37.4 C)] 99.3 F (37.4 C) (06/16 0113) Pulse Rate:  [73-78] 76 (06/16 0113) Resp:  [16-25] 18 (06/16 0113) BP: (141-155)/(68-89) 150/89 (06/16 0113) SpO2:  [94 %-95 %] 94 % (06/16 0113) Weight:  [113.4 kg] 113.4 kg (06/15 1553) Physical Exam: General: Anxious, in no acute distress, resting comfortably  in bed Cardiovascular: Regular rate and rhythm, no murmurs appreciated Respiratory: Clear to auscultation bilaterally, no increased work of breathing, satting well on room air Abdomen: Bowel sounds present, soft, nontender, nondistended Extremities: Right lower extremity > left lower extremity, no calf tenderness, negative Homans'  sign  Laboratory: Recent Labs  Lab 11/21/19 0622 11/26/19 1750 11/27/19 1622  WBC 9.5 11.2* 9.5  HGB 15.3 14.6 14.6  HCT 46.9 45.4 45.3  PLT 187 285 270   Recent Labs  Lab 11/21/19 0622 11/26/19 1750 11/27/19 1622  NA 138 139 138  K 4.5 4.6 4.7  CL 104 100 100  CO2 26 26 27   BUN 21 18 18   CREATININE 1.22 1.12 1.08  CALCIUM 8.7* 8.6* 8.8*  GLUCOSE 126* 112* 106*     Imaging/Diagnostic Tests: CT ANGIO CHEST PE W OR WO CONTRAST  Result Date: 11/27/2019 CLINICAL DATA:  Postop hip replacement 11/22/2019, pain with inspiration, history of bladder cancer EXAM: CT ANGIOGRAPHY CHEST WITH CONTRAST TECHNIQUE: Multidetector CT imaging of the chest was performed using the standard protocol during bolus administration of intravenous contrast. Multiplanar CT image reconstructions and MIPs were obtained to evaluate the vascular anatomy. CONTRAST:  64mL ISOVUE-370 IOPAMIDOL (ISOVUE-370) INJECTION 76% COMPARISON:  11/26/2019 FINDINGS: Cardiovascular: This is a technically adequate evaluation of the pulmonary vasculature. Pulmonary emboli are seen within the posterior and lateral segmental branches of the right lower lobe pulmonary artery. Minimal clot burden. No evidence of right heart strain. The heart is unremarkable without pericardial effusion. Thoracic aorta is normal in caliber with no aneurysm or dissection. Mediastinum/Nodes: No enlarged mediastinal, hilar, or axillary lymph nodes. Thyroid gland, trachea, and esophagus demonstrate no significant findings. Lungs/Pleura: There are patchy areas of consolidation within the right lower lobe. Consolidation in the lateral basilar segment could reflect pulmonary infarct. Consolidation in the medial basilar segment likely reflects atelectasis. There is a trace right pleural effusion. Minimal hypoventilatory changes are seen within the left lower lobe. No pneumothorax. Central airways are patent. Upper Abdomen: Renal cysts are incidentally noted.  Remainder of the upper abdomen is unremarkable. Musculoskeletal: No acute or destructive bony lesions. Reconstructed images demonstrate no additional findings. Review of the MIP images confirms the above findings. IMPRESSION: 1. Right lower lobe segmental pulmonary emboli. No evidence of right heart strain. Minimal clot burden. 2. Patchy consolidation at the lung bases. Consolidation within the lateral lobe basilar likely reflects infarct segment right lower. Other areas likely reflect atelectasis. 3. Trace right pleural effusion. These results were called by telephone at the time of interpretation on 11/27/2019 at 3:23 pm to provider Lindell Spar, PA, who verbally acknowledged these results. Electronically Signed   By: Randa Ngo M.D.   On: 11/27/2019 15:30     Lyndee Hensen, DO 11/28/2019, 2:26 AM PGY-1, [place] Intern pager: [phone], text pages welcome

## 2019-11-28 NOTE — Progress Notes (Signed)
Occupational Therapy Evaluation Patient Details Name: Phillip Hester MRN: [id number] DOB: January 30, 1956 Today's Date: 11/28/2019    History of Present Illness Pt is 64 yo male who presented to the ED with [facility] that started a few days ago.  Pt had R anterior THA on 11/20/19.  Pt admitted with PE.  Pt started on IV heparin around 2200 on 11/27/19.  Pt now switched to oral blood thinners.  Spoke with Dr. Lurline Del who cleared PT to started now (<24 hr since starting heparin) as pt with possible d/c home today.  Pt with PMH of arthritis, bladder CA, GERD, HLD, and OSA.   Clinical Impression   Patient cleared by MD for therapy at this time despite heparin start < 24hrs prior.  Patient lives at home with daughter who has been helping as needed since R THA ~1 week ago.  Patient is WBAT and had anterior approach.  Has been receiving HHPT 3x/week.  Patient was supervision for mobility with RW and ADLs today.  He did require min assist with donning/doffing sock on R side.  Educated patient on AE for dressing.  Will continue to follow with OT acutely to address the deficits listed below.      Follow Up Recommendations  No OT follow up;Supervision - Intermittent    Equipment Recommendations  Other (comment) (Hip Kit)    Recommendations for Other Services       Precautions / Restrictions Precautions Precautions: None Restrictions Weight Bearing Restrictions: Yes RLE Weight Bearing: Weight bearing as tolerated      Mobility Bed Mobility Overal bed mobility: Needs Assistance Bed Mobility: Supine to Sit     Supine to sit: Modified independent (Device/Increase time);HOB elevated Sit to supine: Modified independent (Device/Increase time);HOB elevated   General bed mobility comments: OOB in chiar  Transfers Overall transfer level: Needs assistance Equipment used: Rolling walker (2 wheeled) Transfers: Sit to/from Stand Sit to Stand: Supervision         General transfer comment: close  supervision for safety; performed x 3    Balance Overall balance assessment: Needs assistance Sitting-balance support: Feet supported;No upper extremity supported Sitting balance-Leahy Scale: Normal     Standing balance support: Bilateral upper extremity supported;During functional activity Standing balance-Leahy Scale: Poor Standing balance comment: Reliant on BUE support                            ADL either performed or assessed with clinical judgement   ADL Overall ADL's : Needs assistance/impaired                     Lower Body Dressing: Minimal assistance Lower Body Dressing Details (indicate cue type and reason): With socks/shoes             Functional mobility during ADLs: Supervision/safety;Rolling walker General ADL Comments: Patient is supervision for all ADLs at this time with exception of donning socks/shoes     Vision         Perception     Praxis      Pertinent Vitals/Pain Pain Assessment: Faces Faces Pain Scale: Hurts a little bit Pain Location: R hip  Pain Descriptors / Indicators: Sore Pain Intervention(s): Monitored during session;Limited activity within patient's tolerance     Hand Dominance     Extremity/Trunk Assessment Upper Extremity Assessment Upper Extremity Assessment: Overall WFL for tasks assessed   Lower Extremity Assessment Lower Extremity Assessment: LLE deficits/detail;RLE deficits/detail RLE Deficits / Details: ROM:  WFL; MMT: ankle 5/5, knee at least 3/5 not further tested due to pain, hip 2/5 LLE Deficits / Details: WFL   Cervical / Trunk Assessment Cervical / Trunk Assessment: Normal   Communication Communication Communication: No difficulties   Cognition Arousal/Alertness: Awake/alert Behavior During Therapy: WFL for tasks assessed/performed Overall Cognitive Status: Within Functional Limits for tasks assessed                                     General Comments  Pt on RA with  sats 96% with activity.  HR stable.    Exercises Total Joint Exercises Ankle Circles/Pumps: AROM;Both;Supine;10 reps Hip ABduction/ADduction: Standing;AROM;Strengthening;Right;10 reps Straight Leg Raises: AROM;Strengthening;Right;10 reps;Standing Long Arc Quad: AROM;Strengthening;Both;10 reps;Seated Knee Flexion: AROM;Strengthening;Right;10 reps;Standing Marching in Standing: AROM;Strengthening;10 reps;Both Standing Hip Extension: AROM;Strengthening;Right;10 reps Other Exercises Other Exercises: heel raises and mini squatx x 10   Shoulder Instructions      Home Living Family/patient expects to be discharged to:: Private residence Living Arrangements: Children Available Help at Discharge: Family Type of Home: House Home Access: Stairs to enter [employer] of Steps: 6-7 Entrance Stairs-Rails: None Home Layout: Two level;Bed/bath upstairs;Full bath on main level Alternate Level Stairs-Number of Steps: flight Alternate Level Stairs-Rails: Left Bathroom Shower/Tub: Occupational psychologist: Standard     Home Equipment: Environmental consultant - 2 wheels;Crutches;Bedside commode          Prior Functioning/Environment Level of Independence: Independent        Comments: Pt with recent R THA ~1 week ago.  Prior to that completely independent.  Since that time working with HHPT, ambulating with RW, going up/down stairs, and did need min A with ADLs.        OT Problem List: Decreased activity tolerance;Decreased range of motion;Impaired balance (sitting and/or standing);Pain      OT Treatment/Interventions: Self-care/ADL training;Therapeutic exercise;Therapeutic activities;Balance training;Patient/family education    OT Goals(Current goals can be found in the care plan section) Acute Rehab OT Goals Patient Stated Goal: return home OT Goal Formulation: With patient Time For Goal Achievement: 12/12/19 Potential to Achieve Goals: Good  OT Frequency: Min 2X/week    Barriers to D/C:            Co-evaluation              AM-PAC OT "6 Clicks" Daily Activity     Outcome Measure Help from another person eating meals?: None Help from another person taking care of personal grooming?: A Little Help from another person toileting, which includes using toliet, bedpan, or urinal?: A Little Help from another person bathing (including washing, rinsing, drying)?: A Little Help from another person to put on and taking off regular upper body clothing?: A Little Help from another person to put on and taking off regular lower body clothing?: A Little 6 Click Score: 19   End of Session Equipment Utilized During Treatment: Rolling walker Nurse Communication: Mobility status  Activity Tolerance: Patient tolerated treatment well Patient left: in chair;with call bell/phone within reach  OT Visit Diagnosis: Unsteadiness on feet (R26.81);Pain Pain - Right/Left: Right Pain - part of body: Hip                Time: 1450-1503 OT Time Calculation (min): 13 min Charges:  OT General Charges $OT Visit: 1 Visit OT Evaluation $OT Eval Moderate Complexity: 1 [address], OTR/L   Phylliss Bob 11/28/2019, 3:52 PM

## 2019-11-28 NOTE — Discharge Summary (Signed)
[facility] Discharge Summary  [name] Medical record number: [id number] Date of birth: 08-06-1955 Age: 64 y.o. Gender: male Date of Admission: 11/27/2019  Date of Discharge: 11/28/19 Admitting Physician: Danna Hefty, DO  Primary Care Provider: Patient, No Pcp Per Consultants: None   Indication for Hospitalization: Dyspnea  Discharge Diagnoses/Problem List:  Pulmonary embolism Status post right hip replacement Obstructive sleep apnea Hyperlipidemia GERD History of bladder cancer   Disposition: Home   Discharge Condition: Improved, stable   Discharge Exam:  General: Anxious, in no acute distress, resting comfortably in bed Cardiovascular: Regular rate and rhythm, no murmurs appreciated Respiratory: Clear to auscultation bilaterally, no increased work of breathing, satting well on room air Abdomen: Bowel sounds present, soft, nontender, nondistended Extremities: Right lower extremity > left lower extremity, no calf tenderness, negative Homans' sign Taken from Dr. Birdena Crandall progress note on the day of discharge   Brief Hospital Course:  Phillip Hester is a 64 y.o. male admitted for acute PE.   Patient presented with shortness breath, pleuritic chest pain and RLE swelling for the past day prior to arrival. Had outpatient right LE doppler that was negative and a CTA and was found to have right lower lobe segmental emboli without right heart strain on outpatient CTA. PE considered provoked in setting of uncomplicated total right hip replacement on 11/20/19. No prior PE/DVT nor personal or family history of clotting disorder. Patient was placed on heparin drip overnight.  A transition to Eliquis the next morning.  As DVT was considered provoked secondary to recent surgery, patient to remain on Eliquis for at least a 62-month duration.   Hyperlipidemia Lipid panel: Total cholesterol 203, triglycerides 116, HDL 28, LDL 152.  LDL  126 a year ago.  Patient stopped taking simvastatin.    Issues for Follow Up:  1. DOAC for at least 3 months for provoked PE.  Recommend repeating CBC at follow-up visit. 2. Recommend restarting a statin. 3. Recommend stopping levothyroxine and testosterone  4. Patient should not take aspirin/NSAIDs while on Eliquis.   Significant Procedures:   Significant Labs and Imaging:  Recent Labs  Lab 11/26/19 1750 11/27/19 1622 11/28/19 0456  WBC 11.2* 9.5 8.2  HGB 14.6 14.6 14.0  HCT 45.4 45.3 42.6  PLT 285 270 302   Recent Labs  Lab 11/26/19 1750 11/26/19 1750 11/27/19 1622 11/28/19 0456  NA 139  --  138 139  K 4.6   < > 4.7 4.5  CL 100  --  100 106  CO2 26  --  27 26  GLUCOSE 112*  --  106* 113*  BUN 18  --  18 20  CREATININE 1.12  --  1.08 1.07  CALCIUM 8.6*  --  8.8* 8.4*  ALKPHOS  --   --   --  79  AST  --   --   --  51*  ALT  --   --   --  75*  ALBUMIN  --   --   --  2.6*   < > = values in this interval not displayed.    CT ANGIO CHEST PE W OR WO CONTRAST  Result Date: 11/27/2019 CLINICAL DATA:  Postop hip replacement 11/22/2019, pain with inspiration, history of bladder cancer EXAM: CT ANGIOGRAPHY CHEST WITH CONTRAST TECHNIQUE: Multidetector CT imaging of the chest was performed using the standard protocol during bolus administration of intravenous contrast. Multiplanar CT image reconstructions and MIPs were obtained to evaluate the vascular anatomy.  CONTRAST:  59mL ISOVUE-370 IOPAMIDOL (ISOVUE-370) INJECTION 76% COMPARISON:  11/26/2019 FINDINGS: Cardiovascular: This is a technically adequate evaluation of the pulmonary vasculature. Pulmonary emboli are seen within the posterior and lateral segmental branches of the right lower lobe pulmonary artery. Minimal clot burden. No evidence of right heart strain. The heart is unremarkable without pericardial effusion. Thoracic aorta is normal in caliber with no aneurysm or dissection. Mediastinum/Nodes: No enlarged mediastinal,  hilar, or axillary lymph nodes. Thyroid gland, trachea, and esophagus demonstrate no significant findings. Lungs/Pleura: There are patchy areas of consolidation within the right lower lobe. Consolidation in the lateral basilar segment could reflect pulmonary infarct. Consolidation in the medial basilar segment likely reflects atelectasis. There is a trace right pleural effusion. Minimal hypoventilatory changes are seen within the left lower lobe. No pneumothorax. Central airways are patent. Upper Abdomen: Renal cysts are incidentally noted. Remainder of the upper abdomen is unremarkable. Musculoskeletal: No acute or destructive bony lesions. Reconstructed images demonstrate no additional findings. Review of the MIP images confirms the above findings. IMPRESSION: 1. Right lower lobe segmental pulmonary emboli. No evidence of right heart strain. Minimal clot burden. 2. Patchy consolidation at the lung bases. Consolidation within the lateral lobe basilar likely reflects infarct segment right lower. Other areas likely reflect atelectasis. 3. Trace right pleural effusion. These results were called by telephone at the time of interpretation on 11/27/2019 at 3:23 pm to provider Lindell Spar, PA, who verbally acknowledged these results. Electronically Signed   By: Randa Ngo M.D.   On: 11/27/2019 15:30     Results/Tests Pending at Time of Discharge:   Discharge Medications:  Allergies as of 11/28/2019      Reactions   Bee Venom Anaphylaxis   Prednisone    REACTION: "drove me nutty"      Medication List    STOP taking these medications   aspirin 81 MG chewable tablet   levothyroxine 25 MCG tablet Commonly known as: SYNTHROID   Xyosted 50 MG/0.5ML Soaj Generic drug: Testosterone Enanthate     TAKE these medications   Apixaban Starter Pack (10mg  and 5mg ) Commonly known as: ELIQUIS STARTER PACK Take as directed on package: start with two-5mg  tablets twice daily for 7 days. On day 8, switch to  one-5mg  tablet twice daily.   b complex vitamins tablet Take 2-3 tablets by mouth daily.   DHEA PO Take 75 mg by mouth daily.   FLAX SEED OIL PO Take 1,400-2,800 mg by mouth daily.   Melatonin 10 MG Tabs Take 10-20 mg by mouth at bedtime.   methocarbamol 500 MG tablet Commonly known as: ROBAXIN Take 1 tablet (500 mg total) by mouth every 6 (six) hours as needed for muscle spasms.   multivitamin tablet Take 1 tablet by mouth daily. Multi Genic without Iron   omeprazole 20 MG capsule Commonly known as: PRILOSEC Take 20 mg by mouth daily as needed (reflux).   ondansetron 4 MG disintegrating tablet Commonly known as: Zofran ODT Take 1 tablet (4 mg total) by mouth every 8 (eight) hours as needed for nausea or vomiting.   OVER THE COUNTER MEDICATION Apply 1 application topically daily as needed (Pain). DMSO cream   oxyCODONE 5 MG immediate release tablet Commonly known as: Oxy IR/ROXICODONE Take 1-2 tablets (5-10 mg total) by mouth every 4 (four) hours as needed for moderate pain (pain score 4-6).   traMADol 50 MG tablet Commonly known as: ULTRAM TAKE 1 TO 2 TABLETS(50 TO 100 MG) BY MOUTH EVERY 6 HOURS AS NEEDED  What changed: See the new instructions.   Vitamin B-12 5000 MCG Tbdp Take 5,000-10,000 mg by mouth daily.   vitamin C 1000 MG tablet Take 3,000 mg by mouth daily.   Vitamin D3 25 MCG (1000 UT) Caps Take 3,000 Units by mouth daily.   zinc gluconate 50 MG tablet Take 100 mg by mouth daily.       Discharge Instructions: Please refer to Patient Instructions section of EMR for full details.  Patient was counseled important signs and symptoms that should prompt return to medical care, changes in medications, dietary instructions, activity restrictions, and follow up appointments.   Follow-Up Appointments:   Lyndee Hensen, DO 11/28/2019, 2:25 PM PGY-1, [place]

## 2019-11-28 NOTE — Hospital Course (Signed)
LDL 152 and previouslly 126 1 year ago. Recommend statin  Recommend stopping Levo and testosterone   AST / ALT elevated 51, 75   Dr. Ninfa Linden Wednesday f/u, pain controll

## 2019-11-28 NOTE — Progress Notes (Signed)
OT Cancellation Note  Patient Details Name: [name] MRN: [id number] DOB: [birth year]   Cancelled Treatment:    Reason Eval/Treat Not Completed: Medical issues which prohibited therapy.  Patient has recent PE and started on heparin last night at 2238.  Has not been on heparin 24 hrs yet, will follow up once medically ready.  August Luz, OTR/L   Phylliss Bob 11/28/2019, 8:51 AM

## 2019-11-28 NOTE — Evaluation (Signed)
Physical Therapy Evaluation Patient Details Name: Phillip Hester MRN: [id number] DOB: [birth year] Today's Date: 11/28/2019   History of Present Illness  Pt is 64 yo male who presented to the ED with [facility] that started a few days ago.  Pt had R anterior THA on 11/20/19.  Pt admitted with PE.  Pt started on IV heparin around 2200 on 11/27/19.  Pt now switched to oral blood thinners.  Spoke with Dr. Lurline Del who cleared PT to started now (<24 hr since starting heparin) as pt with possible d/c home today.  Pt with PMH of arthritis, bladder CA, GERD, HLD, and OSA.  Clinical Impression  Pt admitted with PE.  He is s/p recent R THA ~ 1 week ago with WBAT status and no precautions.  Pt was able to demonstrate mobility and gait at Mod I or supervision level.  His VSS with activity on RA.  Pt has 24 hr support at this time and was receiving HHPT.  Will benefit from acute PT (while hospitalized) and continuation of HHPT to progress mobility and exercise s/p THA.   Pt currently with functional limitations due to the deficits listed below (see PT Problem List). Pt will benefit from skilled PT to increase their independence and safety with mobility to allow discharge to the venue listed below.  Pt does demonstrate mobility necessary to return home today if discharged from PT perspective.       Follow Up Recommendations Home health PT (continue HHPT for THA)    Equipment Recommendations  None recommended by PT (has dme)    Recommendations for Other Services       Precautions / Restrictions Precautions Precautions: None Restrictions RLE Weight Bearing: Weight bearing as tolerated      Mobility  Bed Mobility Overal bed mobility: Needs Assistance Bed Mobility: Supine to Sit     Supine to sit: Modified independent (Device/Increase time);HOB elevated Sit to supine: Modified independent (Device/Increase time);HOB elevated   General bed mobility comments: Pt used UE to assist R LE back into bed;  discussed could use L LE hooked under R LE to asssist  Transfers Overall transfer level: Needs assistance Equipment used: Rolling walker (2 wheeled) Transfers: Sit to/from Stand Sit to Stand: Supervision         General transfer comment: close supervision for safety; performed x 3  Ambulation/Gait Ambulation/Gait assistance: Supervision Gait Distance (Feet): 300 Feet Assistive device: Rolling walker (2 wheeled) Gait Pattern/deviations: Step-through pattern;Decreased weight shift to right     General Gait Details: Pt steady with RW and with step through pattern.  He did attempt short distance ambulation (5') with single UE support on sink and with antalgic pattern.  Continue to recommend RW.  Stairs Stairs:  (pt reports doing well with stairs and feels confident he could do at home; pt demonstrates necessary strength and ROM for stairs)          Wheelchair Mobility    Modified Rankin (Stroke Patients Only)       Balance Overall balance assessment: Needs assistance Sitting-balance support: Feet supported;No upper extremity supported Sitting balance-Leahy Scale: Normal     Standing balance support: Single extremity supported                                 Pertinent Vitals/Pain Pain Assessment: 0-10 Pain Score: 5  Pain Location: R hip  Pain Descriptors / Indicators: Sore Pain Intervention(s): Monitored during session;Limited activity within  patient's tolerance    Home Living Family/patient expects to be discharged to:: Private residence Living Arrangements: Children (52 and 52 yo; 88 yo home for summer) Available Help at Discharge: Family Type of Home: House Home Access: Stairs to enter Entrance Stairs-Rails: None Entrance Stairs-Number of Steps: 6-7 Home Layout: Two level;Bed/bath upstairs;Full bath on main level Home Equipment: Walker - 2 wheels;Crutches;Bedside commode      Prior Function Level of Independence: Independent          Comments: Pt with recent R THA ~1 week ago.  Prior to that completely independent.  Since that time working with HHPT, ambulating with RW, going up/down stairs, and did need min A with ADLs.     Hand Dominance        Extremity/Trunk Assessment   Upper Extremity Assessment Upper Extremity Assessment: Overall WFL for tasks assessed    Lower Extremity Assessment Lower Extremity Assessment: LLE deficits/detail;RLE deficits/detail RLE Deficits / Details: ROM: WFL; MMT: ankle 5/5, knee at least 3/5 not further tested due to pain, hip 2/5 LLE Deficits / Details: WFL    Cervical / Trunk Assessment Cervical / Trunk Assessment: Normal  Communication   Communication: No difficulties  Cognition Arousal/Alertness: Awake/alert Behavior During Therapy: Flat affect Overall Cognitive Status: Within Functional Limits for tasks assessed                                        General Comments General comments (skin integrity, edema, etc.): Pt on RA with sats 96% with activity.  HR stable.    Exercises Total Joint Exercises Ankle Circles/Pumps: AROM;Both;Supine;10 reps Hip ABduction/ADduction: Standing;AROM;Strengthening;Right;10 reps Straight Leg Raises: AROM;Strengthening;Right;10 reps;Standing Long Arc Quad: AROM;Strengthening;Both;10 reps;Seated Knee Flexion: AROM;Strengthening;Right;10 reps;Standing Marching in Standing: AROM;Strengthening;10 reps;Both Standing Hip Extension: AROM;Strengthening;Right;10 reps Other Exercises Other Exercises: heel raises and mini squatx x 10   Assessment/Plan    PT Assessment Patient needs continued PT services  PT Problem List Decreased strength;Decreased range of motion;Decreased activity tolerance;Decreased balance;Decreased mobility;Decreased knowledge of use of DME;Pain       PT Treatment Interventions DME instruction;Gait training;Therapeutic activities;Functional mobility training;Stair training;Therapeutic exercise;Balance  training;Patient/family education    PT Goals (Current goals can be found in the Care Plan section)  Acute Rehab PT Goals Patient Stated Goal: return home Time For Goal Achievement: 12/12/19 Potential to Achieve Goals: Good    Frequency Min 3X/week   Barriers to discharge        Co-evaluation               AM-PAC PT "6 Clicks" Mobility  Outcome Measure Help needed turning from your back to your side while in a flat bed without using bedrails?: None Help needed moving from lying on your back to sitting on the side of a flat bed without using bedrails?: None Help needed moving to and from a bed to a chair (including a wheelchair)?: None Help needed standing up from a chair using your arms (e.g., wheelchair or bedside chair)?: None Help needed to walk in hospital room?: None Help needed climbing 3-5 steps with a railing? : None 6 Click Score: 24    End of Session Equipment Utilized During Treatment: Gait belt Activity Tolerance: Patient tolerated treatment well Patient left: in bed;with call bell/phone within reach Nurse Communication: Mobility status PT Visit Diagnosis: Other abnormalities of gait and mobility (R26.89);Pain Pain - Right/Left: Right Pain - part of body: Hip  Time: 1115-1140 PT Time Calculation (min) (ACUTE ONLY): 25 min   Charges:   PT Evaluation $PT Eval Low Complexity: 1 Low PT Treatments $Therapeutic Exercise: 8-22 mins        Abran Richard, PT Acute Rehab Services Pager [phone] [facility] [phone]    Karlton Lemon 11/28/2019, 12:23 PM

## 2019-11-30 ENCOUNTER — Telehealth: Payer: Self-pay | Admitting: Orthopaedic Surgery

## 2019-11-30 NOTE — Telephone Encounter (Signed)
IC advised Phillip Hester out of office. Patient had surgery last Tuesday, came from hospital Wednesday and started having lung issues. He was re-admitted this week and was diagnosed with pulmonary embolism. He wanted to make sure Dr Ninfa Linden was aware. No history of blood clots. He is back home and is on eliquis and tramadol with tylenol as needed. He was advised by hospital to d/c oxycodone. He would like to know if he should be wearing compression stockings?

## 2019-11-30 NOTE — ED Provider Notes (Signed)
[facility] Provider Note   CSN: [id number] Arrival date & time: 11/27/19  1545     History Chief Complaint  Patient presents with  . Pulmonary Embolism    Phillip Hester is a 64 y.o. male.  Patient is a 64 year old male with history of GERD, bladder cancer, hyperlipidemia and recent hip surgery.  He presents today for evaluation of chest pain and shortness of breath.  Patient signed in yesterday, then left without being seen due to prolonged wait time.  He called his doctor today who made arrangements for a PE study.  This was performed and was positive for pulmonary embolism.  He was then told to come to the ER for admission.  Patient complains of ongoing chest discomfort and shortness of breath.  He denies fevers or chills.  The history is provided by the patient.       Past Medical History:  Diagnosis Date  . Arthritis    knees  . Cancer [facility])    bladder cancer  . GERD (gastroesophageal reflux disease)   . Heart murmur    diagnosed with "athlete's murmur" at 17  . History of kidney stones   . Hyperlipidemia   . Pure hypercholesterolemia   . Sleep apnea    CPAP at night    Patient Active Problem List   Diagnosis Date Noted  . Pulmonary embolism ([place]) 11/27/2019  . OSA on CPAP   . Status post total replacement of right hip 11/20/2019  . Unilateral primary osteoarthritis, right hip 07/18/2019  . Cardiovascular risk factor 12/13/2010  . Hyperlipidemia 10/18/2008  . OBSTRUCTIVE SLEEP APNEA 10/18/2008  . SNORING 10/18/2008    Past Surgical History:  Procedure Laterality Date  . INGUINAL HERNIA REPAIR    . IRRIGATION AND DEBRIDEMENT SEBACEOUS CYST    . KNEE SURGERY Left 2013  . SHOULDER SURGERY Right 2013  . TOTAL HIP ARTHROPLASTY Right 11/20/2019   Procedure: RIGHT TOTAL HIP ARTHROPLASTY ANTERIOR APPROACH;  Surgeon: Mcarthur Rossetti, MD;  Location: [place];  Service: Orthopedics;  Laterality: Right;  . WISDOM TOOTH EXTRACTION          Family History  Problem Relation Age of Onset  . Arthritis Mother   . Dementia Father   . Cancer Maternal Grandfather   . Cancer Paternal Grandfather     Social History   Tobacco Use  . Smoking status: Never Smoker  . Smokeless tobacco: Never Used  Vaping Use  . Vaping Use: Never used  Substance Use Topics  . Alcohol use: Yes    Alcohol/week: 2.0 - 3.0 standard drinks    Types: 2 - 3 Standard drinks or equivalent per week    Comment: 2-3 drinks weekly  . Drug use: No    Home Medications Prior to Admission medications   Medication Sig Start Date End Date Taking? Authorizing Provider  Ascorbic Acid (VITAMIN C) 1000 MG tablet Take 3,000 mg by mouth daily.    Yes [provider]  b complex vitamins tablet Take 2-3 tablets by mouth daily.   Yes [provider]  Cholecalciferol (VITAMIN D3) 25 MCG (1000 UT) CAPS Take 3,000 Units by mouth daily.   Yes [provider]  Cyanocobalamin (VITAMIN B-12) 5000 MCG TBDP Take 5,000-10,000 mg by mouth daily.   Yes [provider]  Flaxseed, Linseed, (FLAX SEED OIL PO) Take 1,400-2,800 mg by mouth daily.   Yes [provider]  Melatonin 10 MG TABS Take 10-20 mg by mouth at  bedtime.   Yes [provider]  methocarbamol (ROBAXIN) 500 MG tablet Take 1 tablet (500 mg total) by mouth every 6 (six) hours as needed for muscle spasms. 11/21/19  Yes Mcarthur Rossetti, MD  Multiple Vitamin (MULTIVITAMIN) tablet Take 1 tablet by mouth daily. Multi Genic without Iron   Yes [provider]  Nutritional Supplements (DHEA PO) Take 75 mg by mouth daily.   Yes [provider]  ondansetron (ZOFRAN ODT) 4 MG disintegrating tablet Take 1 tablet (4 mg total) by mouth every 8 (eight) hours as needed for nausea or vomiting. 11/21/19  Yes Mcarthur Rossetti, MD  OVER THE COUNTER MEDICATION Apply 1 application topically daily as needed (Pain). DMSO cream   Yes [provider]   oxyCODONE (OXY IR/ROXICODONE) 5 MG immediate release tablet Take 1-2 tablets (5-10 mg total) by mouth every 4 (four) hours as needed for moderate pain (pain score 4-6). 11/21/19  Yes Mcarthur Rossetti, MD  traMADol (ULTRAM) 50 MG tablet TAKE 1 TO 2 TABLETS(50 TO 100 MG) BY MOUTH EVERY 6 HOURS AS NEEDED Patient taking differently: Take 50-100 mg by mouth daily as needed for moderate pain.  10/31/19  Yes Mcarthur Rossetti, MD  zinc gluconate 50 MG tablet Take 100 mg by mouth daily.   Yes [provider]  APIXABAN Arne [place]) VTE STARTER PACK (10MG  AND 5MG ) Take as directed on package: start with two-5mg  tablets twice daily for 7 days. On day 8, switch to one-5mg  tablet twice daily. 11/28/19   Daisy Floro, DO  omeprazole (PRILOSEC) 20 MG capsule Take 20 mg by mouth daily as needed (reflux). Patient not taking: Reported on 11/27/2019    [provider]  traMADol (ULTRAM) 50 MG tablet Take 1-2 tablets (50-100 mg total) by mouth daily as needed for moderate pain. 11/28/19   Matilde Haymaker, MD    Allergies    Bee venom and Prednisone  Review of Systems   Review of Systems  All other systems reviewed and are negative.   Physical Exam Updated Vital Signs BP 124/86 (BP Location: Left Arm)   Pulse (!) 59   Temp 98.3 F (36.8 C) (Oral)   Resp 16   Ht 6\' 3"  (1.905 m)   Wt 113.4 kg   SpO2 93%   BMI 31.25 kg/m   Physical Exam Vitals and nursing note reviewed.  Constitutional:      General: He is not in acute distress.    Appearance: He is well-developed. He is not diaphoretic.  HENT:     Head: Normocephalic and atraumatic.  Cardiovascular:     Rate and Rhythm: Normal rate and regular rhythm.     Heart sounds: No murmur heard.  No friction rub.  Pulmonary:     Effort: Pulmonary effort is normal. No respiratory distress.     Breath sounds: Normal breath sounds. No wheezing or rales.  Abdominal:     General: Bowel sounds are normal. There is no distension.      Palpations: Abdomen is soft.     Tenderness: There is no abdominal tenderness.  Musculoskeletal:        General: Normal range of motion.     Cervical back: Normal range of motion and neck supple.  Skin:    General: Skin is warm and dry.  Neurological:     Mental Status: He is alert and oriented to person, place, and time.     Coordination: Coordination normal.     ED Results /  Procedures / Treatments   Labs (all labs ordered are listed, but only abnormal results are displayed) Labs Reviewed  BASIC METABOLIC PANEL - Abnormal; Notable for the following components:      Result Value   Glucose, Bld 106 (*)    Calcium 8.8 (*)    All other components within normal limits  COMPREHENSIVE METABOLIC PANEL - Abnormal; Notable for the following components:   Glucose, Bld 113 (*)    Calcium 8.4 (*)    Total Protein 5.9 (*)    Albumin 2.6 (*)    AST 51 (*)    ALT 75 (*)    All other components within normal limits  LIPID PANEL - Abnormal; Notable for the following components:   Cholesterol 203 (*)    HDL 28 (*)    LDL Cholesterol 152 (*)    All other components within normal limits  SARS CORONAVIRUS 2 BY RT PCR (HOSPITAL ORDER, [place] [facility])  CBC  HEPARIN LEVEL (UNFRACTIONATED)  CBC  PROTIME-INR  HIV ANTIBODY (ROUTINE TESTING W REFLEX)    EKG None  Radiology No results found.  Procedures Procedures (including critical care time)  Medications Ordered in ED Medications  heparin bolus via infusion 7,500 Units (7,500 Units Intravenous Bolus from Bag 11/27/19 2238)    ED Course  I have reviewed the triage vital signs and the nursing notes.  Pertinent labs & imaging results that were available during my care of the patient were reviewed by me and considered in my medical decision making (see chart for details).    MDM Rules/Calculators/A&P  Patient's outpatient CT scan confirms pulmonary embolism.  Patient will be admitted to the teaching service.   Vitals are stable with oxygen saturations in the low to mid 90s.  Final Clinical Impression(s) / ED Diagnoses Final diagnoses:  Single subsegmental pulmonary embolism without acute cor pulmonale ([place])    Rx / DC Orders ED Discharge Orders         Ordered    APIXABAN (ELIQUIS) VTE STARTER PACK (10MG  AND 5MG )     Discontinue  Reprint     11/28/19 1413    Increase activity slowly     Discontinue     11/28/19 1413    Diet - low sodium heart healthy     Discontinue     11/28/19 1413    No wound care     Discontinue     11/28/19 [place]     11/28/19 1416    Face-to-face encounter (required for Medicare/Medicaid patients)     Discontinue  Reprint    Comments: I Daisy Floro certify that this patient is under my care and that I, or a nurse practitioner or physician's assistant working with me, had a face-to-face encounter that meets the physician face-to-face encounter requirements with this patient on 11/28/2019. The encounter with the patient was in whole, or in part for the following medical condition(s) which is the primary reason for home health care (List medical condition): s/p Hip Replacement, Pulmonary embolism   11/28/19 1416    traMADol (ULTRAM) 50 MG tablet  Daily PRN     Discontinue  Reprint     11/28/19 1632           Veryl Speak, MD 11/30/19 2229

## 2019-11-30 NOTE — Telephone Encounter (Signed)
Pt requested a CB from [place].   [phone]

## 2019-12-03 NOTE — Telephone Encounter (Signed)
If he is having swelling in his legs then yes.

## 2019-12-03 NOTE — Telephone Encounter (Signed)
Patient aware.

## 2019-12-04 ENCOUNTER — Other Ambulatory Visit: Payer: Self-pay

## 2019-12-04 ENCOUNTER — Encounter: Payer: Self-pay | Admitting: Orthopaedic Surgery

## 2019-12-04 ENCOUNTER — Ambulatory Visit (INDEPENDENT_AMBULATORY_CARE_PROVIDER_SITE_OTHER): Payer: BC Managed Care – PPO | Admitting: Orthopaedic Surgery

## 2019-12-04 DIAGNOSIS — Z96641 Presence of right artificial hip joint: Secondary | ICD-10-CM

## 2019-12-04 NOTE — Progress Notes (Signed)
The patient is 2 weeks today status post a right total hip arthroplasty.  His postoperative course was complicated last week by pulmonary embolus.  He is on Eliquis twice a day now.  He is doing well overall otherwise.  He did have some chest pain last week and shortness of breath which led [place] to get the study and then found out that he had the PE.  He has no breathing difficulties today at all.  On exam, his right hip incision looks good.  There was some staple irritation but no evidence of infection.  I remove the staples in place Steri-Strips.  He had a mild hematoma and but not a seroma.  I tried to aspirate it but it was definitely just hematoma from being on Eliquis.  I gave him reassurance about this as well.  We had a long thorough discussion about his hip and his pulmonary embolus.  All questions and concerns were answered addressed.  He is just taking Tylenol and tramadol for pain.  He will let us know when he needs a refill on the tramadol.  From orthopedic standpoint we will see him back in 4 weeks to see how he is doing overall but no x-rays are needed.  He will likely need to be on Eliquis for a minimum of 3 months.

## 2019-12-06 ENCOUNTER — Telehealth: Payer: Self-pay | Admitting: Orthopaedic Surgery

## 2019-12-06 ENCOUNTER — Telehealth: Payer: Self-pay

## 2019-12-06 ENCOUNTER — Other Ambulatory Visit: Payer: Self-pay

## 2019-12-06 DIAGNOSIS — Z96641 Presence of right artificial hip joint: Secondary | ICD-10-CM

## 2019-12-06 NOTE — Telephone Encounter (Signed)
Home health aghes discharged from home health [facility]  and facility recommend for him to go to out patient PT. Patient wants to be called .

## 2019-12-06 NOTE — Telephone Encounter (Signed)
Patient questioning if he will get scanned again to check the PE?

## 2019-12-06 NOTE — Telephone Encounter (Signed)
Referral sent for PT

## 2019-12-06 NOTE — Telephone Encounter (Signed)
Patient called.   He is requesting a call back from [place] to discuss some side effects from the surgery.   Call back: [phone]

## 2019-12-06 NOTE — Telephone Encounter (Signed)
We will repeat a CT angiogram of his chest.  However, this needs to be in about 2 months from now.

## 2019-12-07 ENCOUNTER — Other Ambulatory Visit: Payer: Self-pay | Admitting: Orthopaedic Surgery

## 2019-12-07 MED ORDER — TRAMADOL HCL 50 MG PO TABS: 50.0000 mg | ORAL_TABLET | Freq: Four times a day (QID) | ORAL | 0 refills | Status: DC | PRN

## 2019-12-07 NOTE — Telephone Encounter (Signed)
Called and informed patient.  Patient concerned he is still having continued groin and lateral hip pain, describes as constant dull ache. States the last 2 nights he has been having to take oxycodone again for the pain.  States he is trying to take the tramadol however the last 2 nights it has not helped and asked about increasing the dosage.  Please advise.

## 2019-12-07 NOTE — Telephone Encounter (Signed)
Tell him that that is normal to feel this kind of pain still after extensive joint replacement surgery that anyone usually still feels this type of pain at this point out from surgery.  Also let him know that the tramadol comes in 50 mg tablets and that he can take 2 of those or we can call in something stronger such as hydrocodone.  Thank you

## 2019-12-07 NOTE — Telephone Encounter (Signed)
I will send some into his pharmacy.

## 2019-12-07 NOTE — Telephone Encounter (Signed)
Patient wants to hold off for now on the hydrocodone.   Requests refill on tramadol, afraid he will run out over weekend.   Thank you.

## 2019-12-12 ENCOUNTER — Encounter: Payer: Self-pay | Admitting: Physical Therapy

## 2019-12-12 ENCOUNTER — Other Ambulatory Visit: Payer: Self-pay

## 2019-12-12 ENCOUNTER — Ambulatory Visit (INDEPENDENT_AMBULATORY_CARE_PROVIDER_SITE_OTHER): Payer: BC Managed Care – PPO | Admitting: Physical Therapy

## 2019-12-12 DIAGNOSIS — R2689 Other abnormalities of gait and mobility: Secondary | ICD-10-CM

## 2019-12-12 DIAGNOSIS — M25651 Stiffness of right hip, not elsewhere classified: Secondary | ICD-10-CM | POA: Diagnosis not present

## 2019-12-12 DIAGNOSIS — M25551 Pain in right hip: Secondary | ICD-10-CM | POA: Diagnosis not present

## 2019-12-12 DIAGNOSIS — M6281 Muscle weakness (generalized): Secondary | ICD-10-CM

## 2019-12-12 NOTE — Patient Instructions (Signed)
Access Code: [email or web address] URL: [email or web address] Date: 12/12/2019 Prepared by: Faustino Congress  Exercises Seated Single Knee to Chest - 1 x daily - 7 x weekly - 1 sets - 5 reps - 10 sec hold Seated Flexion Stretch - 1 x daily - 7 x weekly - 1 sets - 5 reps - 10 sec hold Sit to Stand - 1 x daily - 7 x weekly - 1 sets - 10 reps

## 2019-12-13 NOTE — Therapy (Signed)
[facility] [address] [place], [place], [address] Phone: [phone]   Fax:  [phone]  Physical Therapy Evaluation  Patient Details  Name: Phillip Hester MRN: [id number] Date of Birth: 1956-04-27 Referring Provider (PT): Jean Rosenthal, MD   Encounter Date: 12/12/2019   PT End of Session - 12/12/19 1543    Visit Number 1    Number of Visits 6    Date for PT Re-Evaluation 01/23/20    PT Start Time 7517    PT Stop Time 1422    PT Time Calculation (min) 40 min    Activity Tolerance Patient tolerated treatment well    Behavior During Therapy [facility] for tasks assessed/performed           Past Medical History:  Diagnosis Date  . Arthritis    knees  . Cancer [facility])    bladder cancer  . GERD (gastroesophageal reflux disease)   . Heart murmur    diagnosed with "athlete's murmur" at 17  . History of kidney stones   . Hyperlipidemia   . Pure hypercholesterolemia   . Sleep apnea    CPAP at night    Past Surgical History:  Procedure Laterality Date  . INGUINAL HERNIA REPAIR    . IRRIGATION AND DEBRIDEMENT SEBACEOUS CYST    . KNEE SURGERY Left 2013  . SHOULDER SURGERY Right 2013  . TOTAL HIP ARTHROPLASTY Right 11/20/2019   Procedure: RIGHT TOTAL HIP ARTHROPLASTY ANTERIOR APPROACH;  Surgeon: Mcarthur Rossetti, MD;  Location: [place];  Service: Orthopedics;  Laterality: Right;  . WISDOM TOOTH EXTRACTION      There were no vitals filed for this visit.    Subjective Assessment - 12/12/19 1346    Subjective Pt is a 64 y/o male s/p Rt THA on 11/20/19 and d/c on 11/21/19.  About 3 days later found PE so hospitalized again overnight.  Pt reports continued pain and balance deficits.    Patient Stated Goals improve mobility, see if he really needs OPPT    Currently in Pain? Yes    Pain Score 3    up to 6/10, at best 2/10   Pain Location Hip    Pain Orientation Right    Pain Descriptors / Indicators Aching;Dull    Pain Type Surgical  pain    Pain Onset 1 to 4 weeks ago    Pain Frequency Constant    Aggravating Factors  activity, worse in the evenings    Pain Relieving Factors medication              [facility] PT Assessment - 12/12/19 1351      Assessment   Medical Diagnosis Rt THA complicated with PE    Referring Provider (PT) Jean Rosenthal, MD    Onset Date/Surgical Date 11/20/19    Hand Dominance Right    Next MD Visit 12/24/19    Prior Therapy HHPT - 3 visits      Precautions   Precautions None      Restrictions   Weight Bearing Restrictions No      Balance Screen   Has the patient fallen in the past 6 months No    Has the patient had a decrease in activity level because of a fear of falling?  No    Is the patient reluctant to leave their home because of a fear of falling?  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    Leisure ride motorcycles, rebuilding old car, trout fishing; Haematologist, some walking/swimming prior to surgery      Cognition   Overall Cognitive Status Within Functional Limits for tasks assessed      ROM / Strength   AROM / PROM / Strength AROM;Strength      AROM   Overall AROM Comments Rt hip flexion to 90 deg      Strength   Strength Assessment Site Hip    Right/Left Hip Right;Left    Right Hip Flexion 3-/5    Right Hip ABduction 3+/5     Left Hip Flexion 5/5    Left Hip ABduction 5/5      Transfers   Five time sit to stand comments  13.81 sec without UE support      Ambulation/Gait   Ambulation Distance (Feet) 100 Feet    Assistive device Straight cane;None    Gait Pattern Decreased stance time - right;Decreased step length - left;Decreased hip/knee flexion - right;Antalgic    Gait velocity 3.38 ft/sec                      Objective measurements completed on examination: See above findings.       [facility] Adult PT Treatment/Exercise - 12/12/19 1351      Exercises   Exercises Other Exercises    Other Exercises  see pt instructions - pt performed 3-5 reps of each exercise, recommended he continue with HHPT program as well                  PT Education - 12/12/19 1543    Education Details HEP    Person(s) Educated Patient    Methods Explanation;Demonstration;Handout    Comprehension Verbalized understanding;Returned demonstration            PT Short Term Goals - 12/13/19 0723      PT SHORT TERM GOAL #1   Title report pain < 4/10 with mobility for improved pain and function    Status New    Target Date 01/03/20             PT Long Term Goals - 12/13/19 0724      PT LONG TERM GOAL #1   Title independent with advanced HEP    Status New    Target Date 01/24/20      PT LONG TERM GOAL #2   Title demonstrate improved Rt hip ROM by demonstrating ability to don/doff shoes and socks without difficulty    Status New    Target Date 01/24/20      PT LONG TERM GOAL #3   Title amb without AD with min gait deviations for improved function    Status New    Target Date 01/24/20      PT LONG  TERM GOAL #4   Title improve 5x STS to < 12 sec for improved functional strength    Status New    Target Date 01/24/20                  Plan - 12/12/19 1554    Clinical Impression Statement Pt is a 64 y/o male who presents to OPPT s/p Rt THA on 12/18/70, complicated by PE a few days  later.  Pt demonstrates decreased strength and ROM as well as gait abnormalities affecting functional mobility.  Pt will benefit from PT to address deficits listed.    Personal Factors and Comorbidities Comorbidity 2    Comorbidities knee OA, PE    Examination-Activity Limitations Stand;Locomotion Level;Dressing;Stairs;Squat;Carry;Bend    Examination-Participation Restrictions Yard Work    Stability/Clinical Decision Making Evolving/Moderate complexity    Clinical Decision Making Moderate    Rehab Potential Good    PT Frequency 1x / week    PT Duration 6 weeks    PT Treatment/Interventions ADLs/Self Care Home Management;Cryotherapy;Electrical Stimulation;DME Instruction;Gait training;Stair training;Functional mobility training;Moist Heat;Therapeutic activities;Therapeutic exercise;Balance training;Neuromuscular re-education;Manual techniques;Scar mobilization;Taping;Dry needling;Passive range of motion    PT Next Visit Plan review HEP, add balance activities and hip strengthening, gait without device    PT Home Exercise Plan Access Code: [email or web address]           Patient will benefit from skilled therapeutic intervention in order to improve the following deficits and impairments:  Abnormal gait, Pain, Decreased mobility, Decreased endurance, Decreased range of motion, Decreased strength, Impaired flexibility, Difficulty walking, Decreased balance  Visit Diagnosis: Pain in right hip - Plan: PT plan of care cert/re-cert  Stiffness of right hip, not elsewhere classified - Plan: PT plan of care cert/re-cert  Other abnormalities of gait and mobility - Plan: PT plan of care cert/re-cert  Muscle weakness (generalized) - Plan: PT plan of care cert/re-cert     Problem List Patient Active Problem List   Diagnosis Date Noted  . Pulmonary embolism ([place]) 11/27/2019  . OSA on CPAP   . Status post total replacement of right hip 11/20/2019  . Unilateral primary osteoarthritis, right hip 07/18/2019  .  Cardiovascular risk factor 12/13/2010  . Hyperlipidemia 10/18/2008  . OBSTRUCTIVE SLEEP APNEA 10/18/2008  . SNORING 10/18/2008     Laureen Abrahams, PT, DPT 12/13/19 7:27 AM     [facility] [address] [place], [place], [address] Phone: [phone]   Fax:  [phone]  Name: Phillip Hester MRN: [id number] Date of Birth: 1956/01/23

## 2019-12-21 ENCOUNTER — Encounter: Payer: Self-pay | Admitting: Rehabilitative and Restorative Service Providers"

## 2019-12-21 ENCOUNTER — Ambulatory Visit (INDEPENDENT_AMBULATORY_CARE_PROVIDER_SITE_OTHER): Payer: BC Managed Care – PPO | Admitting: Rehabilitative and Restorative Service Providers"

## 2019-12-21 ENCOUNTER — Other Ambulatory Visit: Payer: Self-pay

## 2019-12-21 DIAGNOSIS — M6281 Muscle weakness (generalized): Secondary | ICD-10-CM

## 2019-12-21 DIAGNOSIS — R2689 Other abnormalities of gait and mobility: Secondary | ICD-10-CM

## 2019-12-21 DIAGNOSIS — M25651 Stiffness of right hip, not elsewhere classified: Secondary | ICD-10-CM

## 2019-12-21 DIAGNOSIS — M25551 Pain in right hip: Secondary | ICD-10-CM | POA: Diagnosis not present

## 2019-12-21 NOTE — Patient Instructions (Signed)
Access Code: [email or web address] URL: [email or web address] Date: 12/21/2019 Prepared by: Scot Jun  Exercises Seated Single Knee to Chest - 1 x daily - 7 x weekly - 1 sets - 5 reps - 10 sec hold Seated Flexion Stretch - 1 x daily - 7 x weekly - 1 sets - 5 reps - 10 sec hold Sit to Stand - 1 x daily - 7 x weekly - 1 sets - 10 reps Supine Single Bent Knee Fallout - 2 x daily - 7 x weekly - 1 sets - 5 reps - 30 hold Clamshell - 1 x daily - 7 x weekly - 3 sets - 10 reps

## 2019-12-21 NOTE — Therapy (Signed)
[facility] [address] [place], [place], [address] Phone: [phone]   Fax:  [phone]  Physical Therapy Treatment  Patient Details  Name: Phillip Hester MRN: [id number] Date of Birth: 04-Dec-1955 Referring Provider (PT): Jean Rosenthal, MD   Encounter Date: 12/21/2019   PT End of Session - 12/21/19 0847    Visit Number 2    Number of Visits 6    Date for PT Re-Evaluation 01/23/20    Progress Note Due on Visit 10    PT Start Time 0843    PT Stop Time 0921    PT Time Calculation (min) 38 min    Activity Tolerance Patient tolerated treatment well    Behavior During Therapy [facility] for tasks assessed/performed           Past Medical History:  Diagnosis Date   Arthritis    knees   Cancer ([place])    bladder cancer   GERD (gastroesophageal reflux disease)    Heart murmur    diagnosed with "athlete's murmur" at 17   History of kidney stones    Hyperlipidemia    Pure hypercholesterolemia    Sleep apnea    CPAP at night    Past Surgical History:  Procedure Laterality Date   [place] CYST     KNEE SURGERY Left 2013   SHOULDER SURGERY Right 2013   TOTAL HIP ARTHROPLASTY Right 11/20/2019   Procedure: RIGHT TOTAL HIP ARTHROPLASTY ANTERIOR APPROACH;  Surgeon: Mcarthur Rossetti, MD;  Location: [place];  Service: Orthopedics;  Laterality: Right;   WISDOM TOOTH EXTRACTION      There were no vitals filed for this visit.   Subjective Assessment - 12/21/19 0848    Subjective Pt. indicated 4/10 pain or so upon arrival.  Pt. indicated some tough bending movement and also the pain was located in groin mainly.    Patient Stated Goals improve mobility, see if he really needs OPPT    Currently in Pain? Yes    Pain Score 4     Pain Location Hip    Pain Orientation Right    Pain Descriptors / Indicators Aching    Pain Type Surgical pain    Pain Onset 1 to 4 weeks ago     Pain Frequency Intermittent    Aggravating Factors  bending forward, dressing, walking    Pain Relieving Factors rest, medication    Effect of Pain on Daily Activities walking/standing                             [facility] Adult PT Treatment/Exercise - 12/21/19 0001      Neuro Re-ed    Neuro Re-ed Details  retro step 20 x bilateral, tandem stance 30 sec       Exercises   Exercises Knee/Hip      Knee/Hip Exercises: Stretches   Other Knee/Hip Stretches supine groin knee fall out stretch 30 sec x 5      Knee/Hip Exercises: Aerobic   Nustep Lvl 5 6 mins      Knee/Hip Exercises: Seated   Other Seated Knee/Hip Exercises seated SLR 2 x 10      Knee/Hip Exercises: Sidelying   Clams 2 x 10 Rt LE                    PT Short Term Goals - 12/13/19 2774  PT SHORT TERM GOAL #1   Title report pain < 4/10 with mobility for improved pain and function    Status New    Target Date 01/03/20             PT Long Term Goals - 12/13/19 0724      PT LONG TERM GOAL #1   Title independent with advanced HEP    Status New    Target Date 01/24/20      PT LONG TERM GOAL #2   Title demonstrate improved Rt hip ROM by demonstrating ability to don/doff shoes and socks without difficulty    Status New    Target Date 01/24/20      PT LONG TERM GOAL #3   Title amb without AD with min gait deviations for improved function    Status New    Target Date 01/24/20      PT LONG TERM GOAL #4   Title improve 5x STS to < 12 sec for improved functional strength    Status New    Target Date 01/24/20                 Plan - 12/21/19 0902    Clinical Impression Statement Noted symptoms in groin c myofascial restriction and weakness in area including hip flexion.  Additional HEP program added to stretch affected area.  Rt superior knee pain noted c squat movement, possible tightness in quad musculature noted. Continued skilled PT services indicated at this time.     Personal Factors and Comorbidities Comorbidity 2    Comorbidities knee OA, PE    Examination-Activity Limitations Stand;Locomotion Level;Dressing;Stairs;Squat;Carry;Bend    Examination-Participation Restrictions Yard Work    Stability/Clinical Decision Making Evolving/Moderate complexity    Rehab Potential Good    PT Frequency 1x / week    PT Duration 6 weeks    PT Treatment/Interventions ADLs/Self Care Home Management;Cryotherapy;Electrical Stimulation;DME Instruction;Gait training;Stair training;Functional mobility training;Moist Heat;Therapeutic activities;Therapeutic exercise;Balance training;Neuromuscular re-education;Manual techniques;Scar mobilization;Taping;Dry needling;Passive range of motion    PT Next Visit Plan Groin myofacial mobility, quad tightness and strengthening    PT Home Exercise Plan Access Code: [email or web address]    Consulted and Agree with Plan of Care Patient           Patient will benefit from skilled therapeutic intervention in order to improve the following deficits and impairments:  Abnormal gait, Pain, Decreased mobility, Decreased endurance, Decreased range of motion, Decreased strength, Impaired flexibility, Difficulty walking, Decreased balance  Visit Diagnosis: Pain in right hip  Stiffness of right hip, not elsewhere classified  Other abnormalities of gait and mobility  Muscle weakness (generalized)     Problem List Patient Active Problem List   Diagnosis Date Noted   Pulmonary embolism ([place]) 11/27/2019   OSA on CPAP    Status post total replacement of right hip 11/20/2019   Unilateral primary osteoarthritis, right hip 07/18/2019   Cardiovascular risk factor 12/13/2010   Hyperlipidemia 10/18/2008   OBSTRUCTIVE SLEEP APNEA 10/18/2008   SNORING 10/18/2008   Scot Jun, PT, DPT, OCS, ATC 12/21/19  9:23 AM    [facility] [address] [place], [place], [address] Phone: [phone]   Fax:   [phone]  Name: Toretto Tingler MRN: [id number] Date of Birth: 1956/05/26

## 2019-12-24 ENCOUNTER — Encounter: Payer: Self-pay | Admitting: Orthopaedic Surgery

## 2019-12-24 ENCOUNTER — Ambulatory Visit (INDEPENDENT_AMBULATORY_CARE_PROVIDER_SITE_OTHER): Payer: BC Managed Care – PPO | Admitting: Orthopaedic Surgery

## 2019-12-24 ENCOUNTER — Other Ambulatory Visit: Payer: Self-pay

## 2019-12-24 ENCOUNTER — Ambulatory Visit: Payer: Self-pay

## 2019-12-24 ENCOUNTER — Ambulatory Visit (INDEPENDENT_AMBULATORY_CARE_PROVIDER_SITE_OTHER): Payer: BC Managed Care – PPO

## 2019-12-24 DIAGNOSIS — G8929 Other chronic pain: Secondary | ICD-10-CM | POA: Diagnosis not present

## 2019-12-24 DIAGNOSIS — M25562 Pain in left knee: Secondary | ICD-10-CM | POA: Diagnosis not present

## 2019-12-24 DIAGNOSIS — M25561 Pain in right knee: Secondary | ICD-10-CM | POA: Diagnosis not present

## 2019-12-24 DIAGNOSIS — Z96641 Presence of right artificial hip joint: Secondary | ICD-10-CM

## 2019-12-24 MED ORDER — APIXABAN 5 MG PO TABS: 5.0000 mg | ORAL_TABLET | Freq: Two times a day (BID) | ORAL | 3 refills | Status: DC

## 2019-12-24 NOTE — Progress Notes (Signed)
Office Visit Note   Patient: Phillip Hester           Date of Birth: 09-01-1955           MRN: [id number] Visit Date: 12/24/2019              Requested by: No referring provider defined for this encounter. PCP: Patient, No Pcp Per   Assessment & Plan: Visit Diagnoses:  1. Chronic pain of left knee   2. Chronic pain of right knee   3. Status post total replacement of right hip     Plan: Eventually he may benefit from steroid injections or even hyaluronic acid for the knees.  I do feel that is appropriate that he try chondroitin or glucosamine for his knees.  I would like to see him back in 4 weeks.  No x-rays are needed.  If he looks good at that standpoint we would likely order a CT angiogram of his chest to assess the previous pulmonary embolus to help determine when we can potentially stop his blood thinning medication.  Follow-Up Instructions: Return in about 4 weeks (around 01/21/2020).   Orders:  Orders Placed This Encounter  Procedures  . XR Knee 1-2 Views Left  . XR Knee 1-2 Views Right   Meds ordered this encounter  Medications  . apixaban (ELIQUIS) 5 MG TABS tablet    Sig: Take 1 tablet (5 mg total) by mouth 2 (two) times daily.    Dispense:  60 tablet    Refill:  3      Procedures: No procedures performed   Clinical Data: No additional findings.   Subjective: Chief Complaint  Patient presents with  . Right Knee - Pain  . Left Knee - Pain  The patient will be 5 weeks tomorrow status post a right total hip arthroplasty.  His postoperative course was complicated by a pulmonary embolus.  He is on Eliquis twice a day.  He did not have any evidence of DVT on ultrasound.  He has been dealing with both his knees hurting with the right knee hurting little bit worse since surgery.  Is mainly medial joint line that hurts.  His left knee underwent arthroscopic intervention by one of my colleagues in town in 2013.  He is asking about glucosamine and chondroitin to  try.  He cannot take anti-inflammatories since he he is on Eliquis.  I did send a refill of that today.  He is walking without any assistive device and states that his hip is doing okay.  He still gets some pain in the groin with his right hip replacement.  HPI  Review of Systems Today he denies any shortness of breath.  He denies any chest pain as well.  Objective: Vital Signs: There were no vitals taken for this visit.  Physical Exam He is alert and orient x3 and in no acute distress Ortho Exam Examination his right hip shows that it moves much more smoothly but there is still some stiffness.  Examination of both knees shows no significant malalignment.  There is no effusion within the knee.  There is medial joint line tenderness but no instability on exam of either knee. Specialty Comments:  No specialty comments available.  Imaging: XR Knee 1-2 Views Left  Result Date: 12/24/2019 2 views of the left knee show no acute findings.  There is only slight medial joint space narrowing.  XR Knee 1-2 Views Right  Result Date: 12/24/2019 2 views of the right knee  show no acute findings.  There is only slight medial joint space narrowing.    PMFS History: Patient Active Problem List   Diagnosis Date Noted  . Pulmonary embolism ([place]) 11/27/2019  . OSA on CPAP   . Status post total replacement of right hip 11/20/2019  . Unilateral primary osteoarthritis, right hip 07/18/2019  . Cardiovascular risk factor 12/13/2010  . Hyperlipidemia 10/18/2008  . OBSTRUCTIVE SLEEP APNEA 10/18/2008  . SNORING 10/18/2008   Past Medical History:  Diagnosis Date  . Arthritis    knees  . Cancer [facility])    bladder cancer  . GERD (gastroesophageal reflux disease)   . Heart murmur    diagnosed with "athlete's murmur" at 17  . History of kidney stones   . Hyperlipidemia   . Pure hypercholesterolemia   . Sleep apnea    CPAP at night    Family History  Problem Relation Age of Onset  . Arthritis  Mother   . Dementia Father   . Cancer Maternal Grandfather   . Cancer Paternal Grandfather     Past Surgical History:  Procedure Laterality Date  . INGUINAL HERNIA REPAIR    . IRRIGATION AND DEBRIDEMENT SEBACEOUS CYST    . KNEE SURGERY Left 2013  . SHOULDER SURGERY Right 2013  . TOTAL HIP ARTHROPLASTY Right 11/20/2019   Procedure: RIGHT TOTAL HIP ARTHROPLASTY ANTERIOR APPROACH;  Surgeon: Mcarthur Rossetti, MD;  Location: [place];  Service: Orthopedics;  Laterality: Right;  . WISDOM TOOTH EXTRACTION     Social History   Occupational History  . 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[employer]  Tobacco Use  . Smoking status: Never Smoker  . Smokeless tobacco: Never Used  Vaping Use  . Vaping Use: Never used  Substance and Sexual Activity  . Alcohol use: Yes    Alcohol/week: 2.0 - 3.0 standard drinks    Types: 2 - 3 Standard drinks or equivalent per week    Comment: 2-3 drinks weekly  . Drug use: No  . Sexual activity: Yes

## 2019-12-28 ENCOUNTER — Ambulatory Visit (INDEPENDENT_AMBULATORY_CARE_PROVIDER_SITE_OTHER): Payer: BC Managed Care – PPO | Admitting: Rehabilitative and Restorative Service Providers"

## 2019-12-28 ENCOUNTER — Encounter: Payer: Self-pay | Admitting: Rehabilitative and Restorative Service Providers"

## 2019-12-28 ENCOUNTER — Other Ambulatory Visit: Payer: Self-pay

## 2019-12-28 DIAGNOSIS — R2689 Other abnormalities of gait and mobility: Secondary | ICD-10-CM | POA: Diagnosis not present

## 2019-12-28 DIAGNOSIS — M25551 Pain in right hip: Secondary | ICD-10-CM | POA: Diagnosis not present

## 2019-12-28 DIAGNOSIS — M25651 Stiffness of right hip, not elsewhere classified: Secondary | ICD-10-CM

## 2019-12-28 DIAGNOSIS — M6281 Muscle weakness (generalized): Secondary | ICD-10-CM | POA: Diagnosis not present

## 2019-12-28 NOTE — Therapy (Addendum)
[facility] Physical Therapy [address] [place], [place], [address] Phone: [phone]   Fax:  [phone]  Physical Therapy Treatment/Discharge  Patient Details  Name: Phillip Hester MRN: [id number] Date of Birth: 01-Sep-1955 Referring Provider (PT): Jean Rosenthal, MD   Encounter Date: 12/28/2019   PT End of Session - 12/28/19 0841    Visit Number 3    Number of Visits 6    Date for PT Re-Evaluation 01/23/20    Progress Note Due on Visit 10    PT Start Time 0842    PT Stop Time 0921    PT Time Calculation (min) 39 min    Activity Tolerance Patient tolerated treatment well    Behavior During Therapy [facility] for tasks assessed/performed           Past Medical History:  Diagnosis Date  . Arthritis    knees  . Cancer [facility])    bladder cancer  . GERD (gastroesophageal reflux disease)   . Heart murmur    diagnosed with "athlete's murmur" at 17  . History of kidney stones   . Hyperlipidemia   . Pure hypercholesterolemia   . Sleep apnea    CPAP at night    Past Surgical History:  Procedure Laterality Date  . INGUINAL HERNIA REPAIR    . IRRIGATION AND DEBRIDEMENT SEBACEOUS CYST    . KNEE SURGERY Left 2013  . SHOULDER SURGERY Right 2013  . TOTAL HIP ARTHROPLASTY Right 11/20/2019   Procedure: RIGHT TOTAL HIP ARTHROPLASTY ANTERIOR APPROACH;  Surgeon: Mcarthur Rossetti, MD;  Location: [place];  Service: Orthopedics;  Laterality: Right;  . WISDOM TOOTH EXTRACTION      There were no vitals filed for this visit.   Subjective Assessment - 12/28/19 0850    Subjective Pt. indicated feeling some generalized improvement overall but still having some pain in groin and Rt knee at times.    Patient Stated Goals improve mobility, see if he really needs OPPT    Currently in Pain? Yes    Pain Score 4     Pain Location Hip    Pain Orientation Right    Pain Descriptors / Indicators Aching    Pain Onset 1 to 4 weeks ago    Pain Frequency Intermittent                               [facility] Adult PT Treatment/Exercise - 12/28/19 0001      Transfers   Five time sit to stand comments  10.27 sec without UE support      Neuro Re-ed    Neuro Re-ed Details  lateral stepping 3 cones x 10 bilateral, SLS c occasional HHA 15 sec x 5 bilateral      Knee/Hip Exercises: Stretches   Other Knee/Hip Stretches supine groin knee fall out stretch 30 sec x 5      Knee/Hip Exercises: Aerobic   Recumbent Bike Lvl 3 6 mins      Knee/Hip Exercises: Machines for Strengthening   Total Gym Leg Press SL 50 lbs 3 x 10 Rt LE, x 10 Lt LE      Knee/Hip Exercises: Standing   Lateral Step Up Step Height: 4";20 reps;Right      Knee/Hip Exercises: Seated   Other Seated Knee/Hip Exercises seated SLR x 15    Sit to Sand without UE support   x5      Manual Therapy   Manual therapy comments  active compression c movement to Rt groin                    PT Short Term Goals - 12/13/19 0723      PT SHORT TERM GOAL #1   Title report pain < 4/10 with mobility for improved pain and function    Status New    Target Date 01/03/20             PT Long Term Goals - 12/28/19 0852      PT LONG TERM GOAL #1   Title independent with advanced HEP    Status On-going    Target Date 01/24/20      PT LONG TERM GOAL #2   Title demonstrate improved Rt hip ROM by demonstrating ability to don/doff shoes and socks without difficulty    Status On-going    Target Date 01/24/20      PT LONG TERM GOAL #3   Title amb without AD with min gait deviations for improved function    Status On-going    Target Date 01/24/20      PT LONG TERM GOAL #4   Title improve 5x STS to < 12 sec for improved functional strength    Status On-going    Target Date 01/24/20                 Plan - 12/28/19 0922    Clinical Impression Statement Mild improvement in stance phase and reduction of antalgic gait at times in clinic.  Pt. demonstrated improved 5 x sit  to stand time.  Continued tenderness, myofascial restrictions from Rt groin, hip flexor musculature at this time.  Continued strength, mobility gains indicated to improve gait pattern.    Personal Factors and Comorbidities Comorbidity 2    Comorbidities knee OA, PE    Examination-Activity Limitations Stand;Locomotion Level;Dressing;Stairs;Squat;Carry;Bend    Examination-Participation Restrictions Yard Work    Stability/Clinical Decision Making Evolving/Moderate complexity    Rehab Potential Good    PT Frequency 1x / week    PT Duration 6 weeks    PT Treatment/Interventions ADLs/Self Care Home Management;Cryotherapy;Electrical Stimulation;DME Instruction;Gait training;Stair training;Functional mobility training;Moist Heat;Therapeutic activities;Therapeutic exercise;Balance training;Neuromuscular re-education;Manual techniques;Scar mobilization;Taping;Dry needling;Passive range of motion    PT Next Visit Plan Reassess use of manual release techniques for groin, continue to progress LE strength/balance for ambulation improvements.    PT Home Exercise Plan Access Code: [email or web address]    Consulted and Agree with Plan of Care Patient           Patient will benefit from skilled therapeutic intervention in order to improve the following deficits and impairments:  Abnormal gait, Pain, Decreased mobility, Decreased endurance, Decreased range of motion, Decreased strength, Impaired flexibility, Difficulty walking, Decreased balance  Visit Diagnosis: Pain in right hip  Stiffness of right hip, not elsewhere classified  Other abnormalities of gait and mobility  Muscle weakness (generalized)     Problem List Patient Active Problem List   Diagnosis Date Noted  . Pulmonary embolism ([place]) 11/27/2019  . OSA on CPAP   . Status post total replacement of right hip 11/20/2019  . Unilateral primary osteoarthritis, right hip 07/18/2019  . Cardiovascular risk factor 12/13/2010  . Hyperlipidemia 10/18/2008   . OBSTRUCTIVE SLEEP APNEA 10/18/2008  . SNORING 10/18/2008    Scot Jun, PT, DPT, OCS, ATC 12/28/19  9:24 AM  PHYSICAL THERAPY DISCHARGE SUMMARY  Visits from Start of Care: 3 Current functional level related to goals / functional outcomes: See  note   Remaining deficits: See note   Education / Equipment: hep Plan: Patient agrees to discharge.  Patient goals were partially met. Patient is being discharged due to not returning since the last visit.  ?????     Scot Jun, PT, DPT, OCS, ATC 03/31/20  8:30 AM     [facility] Physical Therapy [address] [place], [place], [address] Phone: [phone]   Fax:  [phone]  Name: Phillip Hester MRN: [id number] Date of Birth: 1956-05-09

## 2020-01-01 ENCOUNTER — Ambulatory Visit: Payer: BC Managed Care – PPO | Admitting: Orthopaedic Surgery

## 2020-01-03 ENCOUNTER — Encounter: Payer: BC Managed Care – PPO | Admitting: Rehabilitative and Restorative Service Providers"

## 2020-01-03 ENCOUNTER — Telehealth: Payer: Self-pay | Admitting: Rehabilitative and Restorative Service Providers"

## 2020-01-03 NOTE — Telephone Encounter (Signed)
Clinic performed follow up call after 15 mins late to appointment.  Pt. indicated he had cancelled today's appointment due to traveling.    Reminded of next appointment scheduled.  Scot Jun, PT, DPT, OCS, ATC 01/03/20  9:02 AM

## 2020-01-11 ENCOUNTER — Encounter: Payer: BC Managed Care – PPO | Admitting: Physical Therapy

## 2020-01-18 ENCOUNTER — Encounter: Payer: BC Managed Care – PPO | Admitting: Physical Therapy

## 2020-01-28 ENCOUNTER — Encounter: Payer: Self-pay | Admitting: Orthopaedic Surgery

## 2020-01-28 ENCOUNTER — Ambulatory Visit (INDEPENDENT_AMBULATORY_CARE_PROVIDER_SITE_OTHER): Payer: BC Managed Care – PPO | Admitting: Orthopaedic Surgery

## 2020-01-28 DIAGNOSIS — Z96641 Presence of right artificial hip joint: Secondary | ICD-10-CM

## 2020-01-28 MED ORDER — APIXABAN 5 MG PO TABS: 5.0000 mg | ORAL_TABLET | Freq: Two times a day (BID) | ORAL | 3 refills | Status: DC

## 2020-01-28 NOTE — Progress Notes (Signed)
The patient is now 2 and half months status post a right total hip arthroplasty.  He is still having some pain in the groin area especially when he first gets up but he can walk it off.  He did a lot of walking this weekend.  Otherwise he is doing well.  He is on Eliquis twice a day.  Postoperatively he did have a pulmonary embolus.  He still denies any breathing issues.  On examination his right hip has smooth and fluid range of motion the right hip.  I did watch him ambulate he has a limp when he first gets up.  From my standpoint, I do not need to see him back for 6 months.  At that visit like a standing order in the pelvis and lateral of his right hip.  We do need to order a CT angiogram of his chest to assess the pulmonary embolus.  If it is gone, we can have him stop the Eliquis.  I will still order some just in case.  Since we have the results of the CTA I will let him know.  All questions and concerns were answered and addressed.

## 2020-01-29 ENCOUNTER — Other Ambulatory Visit: Payer: Self-pay

## 2020-01-29 DIAGNOSIS — I2699 Other pulmonary embolism without acute cor pulmonale: Secondary | ICD-10-CM

## 2020-01-29 DIAGNOSIS — R079 Chest pain, unspecified: Secondary | ICD-10-CM

## 2020-02-13 ENCOUNTER — Ambulatory Visit
Admission: RE | Admit: 2020-02-13 | Discharge: 2020-02-13 | Disposition: A | Discharge status: Home / Self Care | Payer: BC Managed Care – PPO | Source: Ambulatory Visit | Attending: Orthopaedic Surgery | Admitting: Orthopaedic Surgery

## 2020-02-13 ENCOUNTER — Other Ambulatory Visit: Payer: Self-pay | Admitting: Orthopaedic Surgery

## 2020-02-13 DIAGNOSIS — R079 Chest pain, unspecified: Secondary | ICD-10-CM

## 2020-02-13 MED ORDER — METHYLPREDNISOLONE 4 MG PO TABS
ORAL_TABLET | ORAL | 0 refills | Status: DC
Start: 2020-02-13 — End: 2021-08-31

## 2020-02-13 MED ORDER — IOPAMIDOL (ISOVUE-370) INJECTION 76%
75.0000 mL | Freq: Once | INTRAVENOUS | Status: AC | PRN
  Administered 2020-02-13: 75 mL via INTRAVENOUS

## 2020-02-15 ENCOUNTER — Inpatient Hospital Stay: Admission: RE | Admit: 2020-02-15 | Payer: BC Managed Care – PPO | Source: Ambulatory Visit

## 2020-06-04 DIAGNOSIS — M19012 Primary osteoarthritis, left shoulder: Secondary | ICD-10-CM | POA: Insufficient documentation

## 2020-06-20 DIAGNOSIS — M47816 Spondylosis without myelopathy or radiculopathy, lumbar region: Secondary | ICD-10-CM | POA: Diagnosis not present

## 2020-07-09 DIAGNOSIS — R5383 Other fatigue: Secondary | ICD-10-CM | POA: Diagnosis not present

## 2020-07-09 DIAGNOSIS — N329 Bladder disorder, unspecified: Secondary | ICD-10-CM | POA: Diagnosis not present

## 2020-07-09 DIAGNOSIS — J069 Acute upper respiratory infection, unspecified: Secondary | ICD-10-CM | POA: Diagnosis not present

## 2020-07-10 DIAGNOSIS — R972 Elevated prostate specific antigen [PSA]: Secondary | ICD-10-CM | POA: Diagnosis not present

## 2020-07-10 DIAGNOSIS — N200 Calculus of kidney: Secondary | ICD-10-CM | POA: Diagnosis not present

## 2020-07-10 DIAGNOSIS — Z8042 Family history of malignant neoplasm of prostate: Secondary | ICD-10-CM | POA: Diagnosis not present

## 2020-07-10 DIAGNOSIS — C672 Malignant neoplasm of lateral wall of bladder: Secondary | ICD-10-CM | POA: Diagnosis not present

## 2020-07-14 DIAGNOSIS — E785 Hyperlipidemia, unspecified: Secondary | ICD-10-CM | POA: Diagnosis not present

## 2020-07-14 DIAGNOSIS — Z86711 Personal history of pulmonary embolism: Secondary | ICD-10-CM | POA: Diagnosis not present

## 2020-07-14 DIAGNOSIS — Z8551 Personal history of malignant neoplasm of bladder: Secondary | ICD-10-CM | POA: Diagnosis not present

## 2020-07-14 DIAGNOSIS — E291 Testicular hypofunction: Secondary | ICD-10-CM | POA: Diagnosis not present

## 2020-07-24 DIAGNOSIS — H5319 Other subjective visual disturbances: Secondary | ICD-10-CM | POA: Diagnosis not present

## 2020-07-24 DIAGNOSIS — H25013 Cortical age-related cataract, bilateral: Secondary | ICD-10-CM | POA: Diagnosis not present

## 2020-07-24 DIAGNOSIS — H35371 Puckering of macula, right eye: Secondary | ICD-10-CM | POA: Diagnosis not present

## 2020-07-24 DIAGNOSIS — H2513 Age-related nuclear cataract, bilateral: Secondary | ICD-10-CM | POA: Diagnosis not present

## 2020-07-29 DIAGNOSIS — N400 Enlarged prostate without lower urinary tract symptoms: Secondary | ICD-10-CM | POA: Diagnosis not present

## 2020-07-29 DIAGNOSIS — E291 Testicular hypofunction: Secondary | ICD-10-CM | POA: Diagnosis not present

## 2020-07-29 DIAGNOSIS — R7309 Other abnormal glucose: Secondary | ICD-10-CM | POA: Diagnosis not present

## 2020-07-29 DIAGNOSIS — R5381 Other malaise: Secondary | ICD-10-CM | POA: Diagnosis not present

## 2020-07-30 ENCOUNTER — Ambulatory Visit: Payer: Self-pay

## 2020-07-30 ENCOUNTER — Encounter: Payer: Self-pay | Admitting: Orthopaedic Surgery

## 2020-07-30 ENCOUNTER — Ambulatory Visit (INDEPENDENT_AMBULATORY_CARE_PROVIDER_SITE_OTHER): Payer: BC Managed Care – PPO | Admitting: Orthopaedic Surgery

## 2020-07-30 DIAGNOSIS — Z96641 Presence of right artificial hip joint: Secondary | ICD-10-CM | POA: Diagnosis not present

## 2020-07-30 NOTE — Progress Notes (Signed)
The patient is now 8 months status post a right total hip arthroplasty.  He says he is doing well except for he still gets some pain in his groin area.  Is not a constant pain but he does notice it.  He does have a history of a hernia on the right side that required hernia repair.  He walks without a limp.  His right hip has smooth and fluid range of motion and is equal to his left hip.  There is no pain in the groin on range of motion of the hip or compression of the hip itself.  An AP pelvis that is standing in the lateral of the right hip shows a well-seated total hip arthroplasty with no complicating features.  Over the groin pain will continue to subside with time.  It can take up to a year or so to fully recover from hip replacement surgery.  I would like to reevaluate him in 4 months with just an AP and lateral of the right hip.  He will continue activities as comfort allows.

## 2020-08-11 DIAGNOSIS — J069 Acute upper respiratory infection, unspecified: Secondary | ICD-10-CM | POA: Diagnosis not present

## 2020-08-11 DIAGNOSIS — N329 Bladder disorder, unspecified: Secondary | ICD-10-CM | POA: Diagnosis not present

## 2020-08-11 DIAGNOSIS — R5383 Other fatigue: Secondary | ICD-10-CM | POA: Diagnosis not present

## 2020-08-12 DIAGNOSIS — L821 Other seborrheic keratosis: Secondary | ICD-10-CM | POA: Diagnosis not present

## 2020-08-12 DIAGNOSIS — Z85828 Personal history of other malignant neoplasm of skin: Secondary | ICD-10-CM | POA: Diagnosis not present

## 2020-08-12 DIAGNOSIS — L570 Actinic keratosis: Secondary | ICD-10-CM | POA: Diagnosis not present

## 2020-08-12 DIAGNOSIS — D1801 Hemangioma of skin and subcutaneous tissue: Secondary | ICD-10-CM | POA: Diagnosis not present

## 2020-08-21 DIAGNOSIS — C672 Malignant neoplasm of lateral wall of bladder: Secondary | ICD-10-CM | POA: Diagnosis not present

## 2020-08-21 DIAGNOSIS — L739 Follicular disorder, unspecified: Secondary | ICD-10-CM | POA: Diagnosis not present

## 2020-08-21 DIAGNOSIS — Z8042 Family history of malignant neoplasm of prostate: Secondary | ICD-10-CM | POA: Diagnosis not present

## 2020-08-21 DIAGNOSIS — R972 Elevated prostate specific antigen [PSA]: Secondary | ICD-10-CM | POA: Diagnosis not present

## 2020-09-29 DIAGNOSIS — E291 Testicular hypofunction: Secondary | ICD-10-CM | POA: Diagnosis not present

## 2020-10-21 DIAGNOSIS — D509 Iron deficiency anemia, unspecified: Secondary | ICD-10-CM | POA: Diagnosis not present

## 2020-10-21 DIAGNOSIS — I10 Essential (primary) hypertension: Secondary | ICD-10-CM | POA: Diagnosis not present

## 2020-10-21 DIAGNOSIS — N189 Chronic kidney disease, unspecified: Secondary | ICD-10-CM | POA: Diagnosis not present

## 2020-10-21 DIAGNOSIS — E291 Testicular hypofunction: Secondary | ICD-10-CM | POA: Diagnosis not present

## 2020-11-06 DIAGNOSIS — C44329 Squamous cell carcinoma of skin of other parts of face: Secondary | ICD-10-CM | POA: Diagnosis not present

## 2020-11-13 DIAGNOSIS — N329 Bladder disorder, unspecified: Secondary | ICD-10-CM | POA: Diagnosis not present

## 2020-11-13 DIAGNOSIS — J069 Acute upper respiratory infection, unspecified: Secondary | ICD-10-CM | POA: Diagnosis not present

## 2020-11-13 DIAGNOSIS — R5383 Other fatigue: Secondary | ICD-10-CM | POA: Diagnosis not present

## 2020-11-27 ENCOUNTER — Encounter: Payer: Self-pay | Admitting: Orthopaedic Surgery

## 2020-11-27 ENCOUNTER — Ambulatory Visit (INDEPENDENT_AMBULATORY_CARE_PROVIDER_SITE_OTHER): Payer: BC Managed Care – PPO | Admitting: Orthopaedic Surgery

## 2020-11-27 ENCOUNTER — Ambulatory Visit: Payer: Self-pay

## 2020-11-27 DIAGNOSIS — Z96641 Presence of right artificial hip joint: Secondary | ICD-10-CM | POA: Diagnosis not present

## 2020-11-27 NOTE — Progress Notes (Signed)
The patient is now a year out from a right total hip arthroplasty through direct anterior approach.  He still has some pain in his groin on the right side.  Sometimes it is worse in the morning or sometimes it is worse when he is been walking a long distance.  He also has a previous history of a right-sided hernia repair that was done in [place].  He is walking without assistive device and looks good overall but the pain that he has in the groin is still present and frustrating to him.  I can put his right hip to internal and external rotation with no difficulty at all.  The extreme of right hip flexion does cause some pain in the groin area.  I can also press hard just above the inguinal ligament area of the right groin and he gets a significant mount of pain in this area.  An AP pelvis and lateral of the right hip shows a well-seated implant with no complicating features.  There is no evidence of loosening.  There is no overhang of the acetabular component.  From my standpoint he still may have some iliopsoas tendinitis and would benefit from a one-time injection around the iliopsoas tendon under fluoroscopy on the right side by Dr. Ernestina Patches.  However, I would still like to send him to [facility] here in town so they can evaluate his right inguinal area and to assess if there is irritation that he is experiencing from previous hernia surgery on the right side.  He agrees with both referrals.  Once he has an injection by Dr. Ernestina Patches, he can be sent back to me about 2 to 3 weeks after that injection.  All questions and concerns were answered and addressed.

## 2020-11-28 ENCOUNTER — Other Ambulatory Visit: Payer: Self-pay

## 2020-11-28 DIAGNOSIS — M25551 Pain in right hip: Secondary | ICD-10-CM

## 2020-12-11 DIAGNOSIS — C44329 Squamous cell carcinoma of skin of other parts of face: Secondary | ICD-10-CM | POA: Diagnosis not present

## 2020-12-18 DIAGNOSIS — D485 Neoplasm of uncertain behavior of skin: Secondary | ICD-10-CM | POA: Diagnosis not present

## 2020-12-18 DIAGNOSIS — D2339 Other benign neoplasm of skin of other parts of face: Secondary | ICD-10-CM | POA: Diagnosis not present

## 2020-12-24 DIAGNOSIS — F32A Depression, unspecified: Secondary | ICD-10-CM | POA: Diagnosis not present

## 2020-12-25 ENCOUNTER — Ambulatory Visit (INDEPENDENT_AMBULATORY_CARE_PROVIDER_SITE_OTHER): Payer: BC Managed Care – PPO | Admitting: Physical Medicine and Rehabilitation

## 2020-12-25 ENCOUNTER — Other Ambulatory Visit: Payer: Self-pay

## 2020-12-25 ENCOUNTER — Encounter: Payer: Self-pay | Admitting: Physical Medicine and Rehabilitation

## 2020-12-25 ENCOUNTER — Ambulatory Visit: Payer: Self-pay

## 2020-12-25 DIAGNOSIS — M7071 Other bursitis of hip, right hip: Secondary | ICD-10-CM | POA: Diagnosis not present

## 2020-12-25 NOTE — Progress Notes (Signed)
Pt state right groin area pain. Pt state getting up in the morning is when he has the most pain. Pt state he use pain cream to help ease his pain.  Numeric Pain Rating Scale and Functional Assessment Average Pain 3   In the last MONTH (on 0-10 scale) has pain interfered with the following?  1. General activity like being  able to carry out your everyday physical activities such as walking, climbing stairs, carrying groceries, or moving a chair?  Rating(5)   -BT, -Dye Allergies.

## 2020-12-25 NOTE — Progress Notes (Signed)
   Phillip Hester - 65 y.o. male MRN [id number]  Date of birth: 1956/02/22  Office Visit Note: Visit Date: 12/25/2020 PCP: Patient, No Pcp Per (Inactive) Referred by: Mcarthur Rossetti*  Subjective: Chief Complaint  Patient presents with   Right Hip - Pain   HPI:  Phillip Hester is a 65 y.o. male who comes in today at the request of Dr. Jean Rosenthal for planned Right iliopsoas tendon/bursa injection with fluoroscopic guidance.  The patient has failed conservative care including home exercise, medications, time and activity modification.  This injection will be diagnostic and hopefully therapeutic.  Please see requesting physician notes for further details and justification.  He is status post right total hip replacement.  He did have a prior intra-articular hip injection by Dr. Eunice Blase which was unsuccessful.  He is also undergoing consultation for prior hernia in this area.   ROS Otherwise per HPI.  Assessment & Plan: Visit Diagnoses:    ICD-10-CM   1. Iliopsoas bursitis of right hip  M70.71       Plan: No additional findings.   Meds & Orders: No orders of the defined types were placed in this encounter.   Orders Placed This Encounter  Procedures   Large Joint Inj    Follow-up: No follow-ups on file.   Procedures: Right iliopsoas injection on 12/25/2020 8:53 AM Indications: pain and diagnostic evaluation Details: 22 G 3.5 in needle, fluoroscopy-guided anterior approach  Arthrogram: No  Medications: 5 mL bupivacaine 0.25 %; 60 mg triamcinolone acetonide 40 MG/ML Outcome: tolerated well, no immediate complications  There was good flow of contrast outlining the iliopsoas muscle. Consent was given by the patient. Immediately prior to procedure a time out was called to verify the correct patient, procedure, equipment, support staff and site/side marked as required. Patient was prepped and draped in the usual sterile fashion.         Clinical  History: No specialty comments available.     Objective:  VS:  HT:    WT:   BMI:     BP:   HR: bpm  TEMP: ( )  RESP:  Physical Exam   Imaging: No results found.

## 2020-12-26 DIAGNOSIS — R1031 Right lower quadrant pain: Secondary | ICD-10-CM | POA: Diagnosis not present

## 2020-12-26 DIAGNOSIS — G8929 Other chronic pain: Secondary | ICD-10-CM | POA: Diagnosis not present

## 2020-12-26 MED ORDER — TRIAMCINOLONE ACETONIDE 40 MG/ML IJ SUSP
60.0000 mg | INTRAMUSCULAR | Status: AC | PRN
  Administered 2020-12-25: 60 mg via INTRA_ARTICULAR

## 2020-12-26 MED ORDER — BUPIVACAINE HCL 0.25 % IJ SOLN
5.0000 mL | INTRAMUSCULAR | Status: AC | PRN
  Administered 2020-12-25: 5 mL via INTRA_ARTICULAR

## 2021-01-07 DIAGNOSIS — M47816 Spondylosis without myelopathy or radiculopathy, lumbar region: Secondary | ICD-10-CM | POA: Diagnosis not present

## 2021-01-19 DIAGNOSIS — H35371 Puckering of macula, right eye: Secondary | ICD-10-CM | POA: Diagnosis not present

## 2021-01-19 DIAGNOSIS — H5319 Other subjective visual disturbances: Secondary | ICD-10-CM | POA: Diagnosis not present

## 2021-01-19 DIAGNOSIS — H25013 Cortical age-related cataract, bilateral: Secondary | ICD-10-CM | POA: Diagnosis not present

## 2021-01-19 DIAGNOSIS — H2513 Age-related nuclear cataract, bilateral: Secondary | ICD-10-CM | POA: Diagnosis not present

## 2021-02-05 DIAGNOSIS — E291 Testicular hypofunction: Secondary | ICD-10-CM | POA: Diagnosis not present

## 2021-02-05 DIAGNOSIS — R7309 Other abnormal glucose: Secondary | ICD-10-CM | POA: Diagnosis not present

## 2021-02-05 DIAGNOSIS — R5381 Other malaise: Secondary | ICD-10-CM | POA: Diagnosis not present

## 2021-02-09 DIAGNOSIS — M722 Plantar fascial fibromatosis: Secondary | ICD-10-CM | POA: Diagnosis not present

## 2021-02-12 DIAGNOSIS — L570 Actinic keratosis: Secondary | ICD-10-CM | POA: Diagnosis not present

## 2021-02-12 DIAGNOSIS — L821 Other seborrheic keratosis: Secondary | ICD-10-CM | POA: Diagnosis not present

## 2021-02-12 DIAGNOSIS — L738 Other specified follicular disorders: Secondary | ICD-10-CM | POA: Diagnosis not present

## 2021-02-12 DIAGNOSIS — Z85828 Personal history of other malignant neoplasm of skin: Secondary | ICD-10-CM | POA: Diagnosis not present

## 2021-03-04 IMAGING — RF DG C-ARM 1-60 MIN
1 series · 3 of 3 positions shown · non-contrast
Comparison: 08/15/2019

CLINICAL DATA: Right total hip arthroplasty

EXAM:
DG C-ARM 1-60 MIN
FLUOROSCOPY TIME:  Fluoroscopy Time:  27.2 seconds
Radiation Exposure Index (if provided by the fluoroscopic device):
3.1543 mGy
Number of Acquired Spot Images: 3

[Series 1: unknown protocol · 0.20mm/px · 3 of 3 slices shown]
[im 1/3]
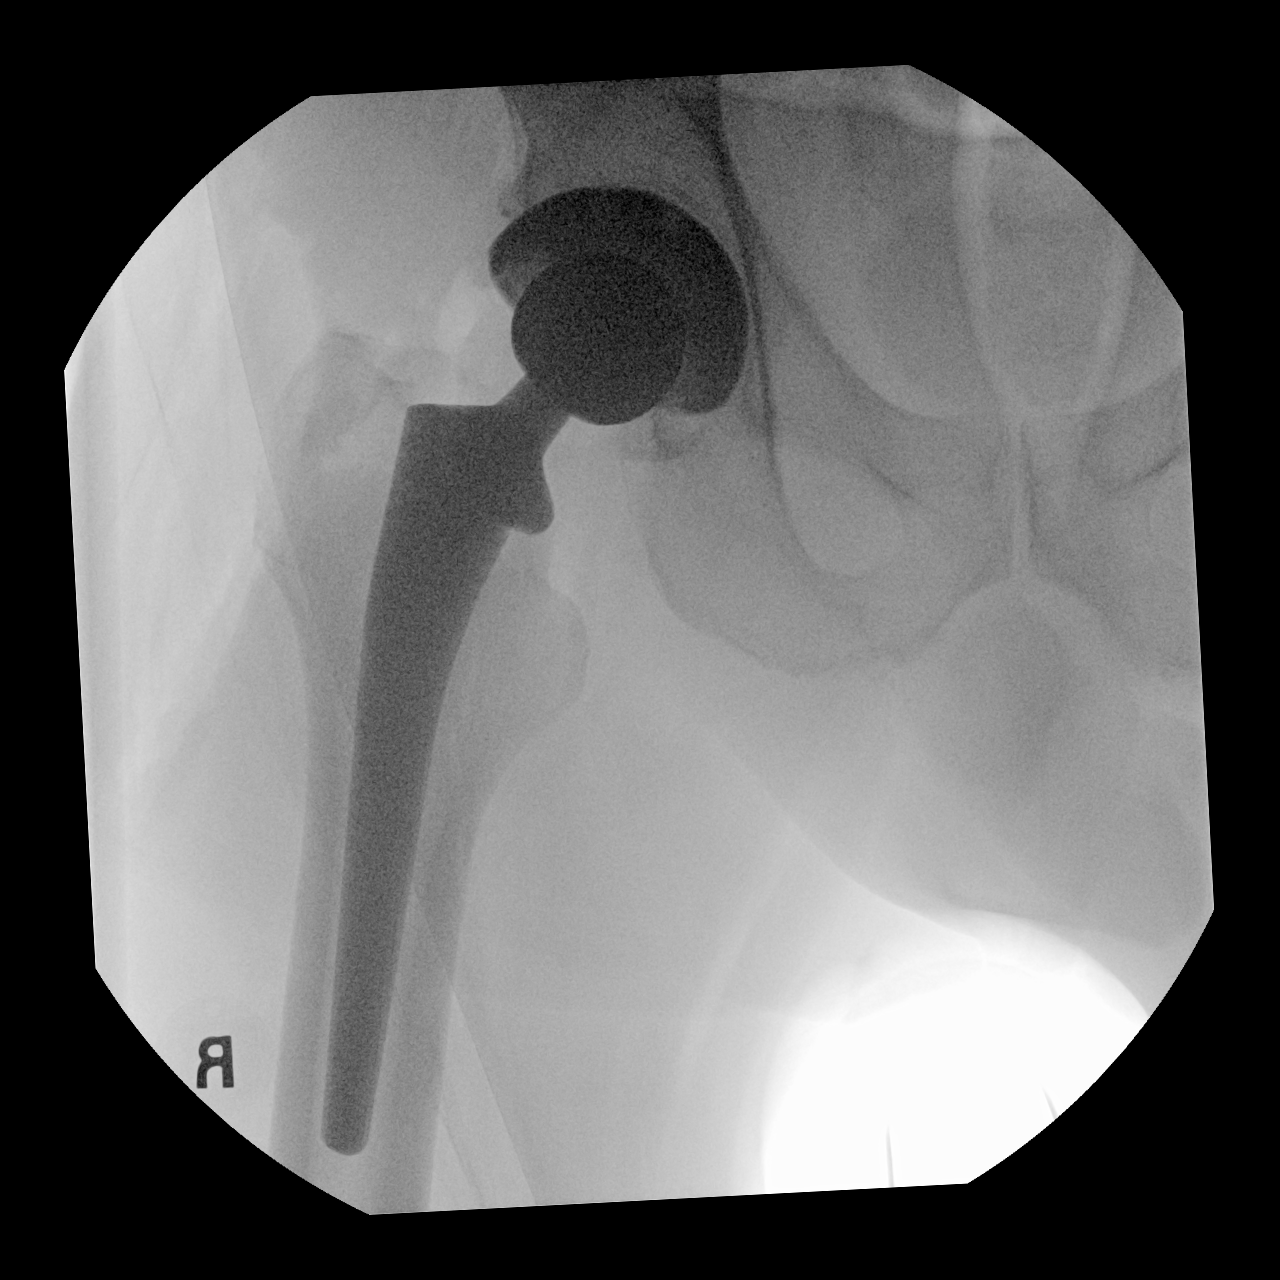
[im 2/3]
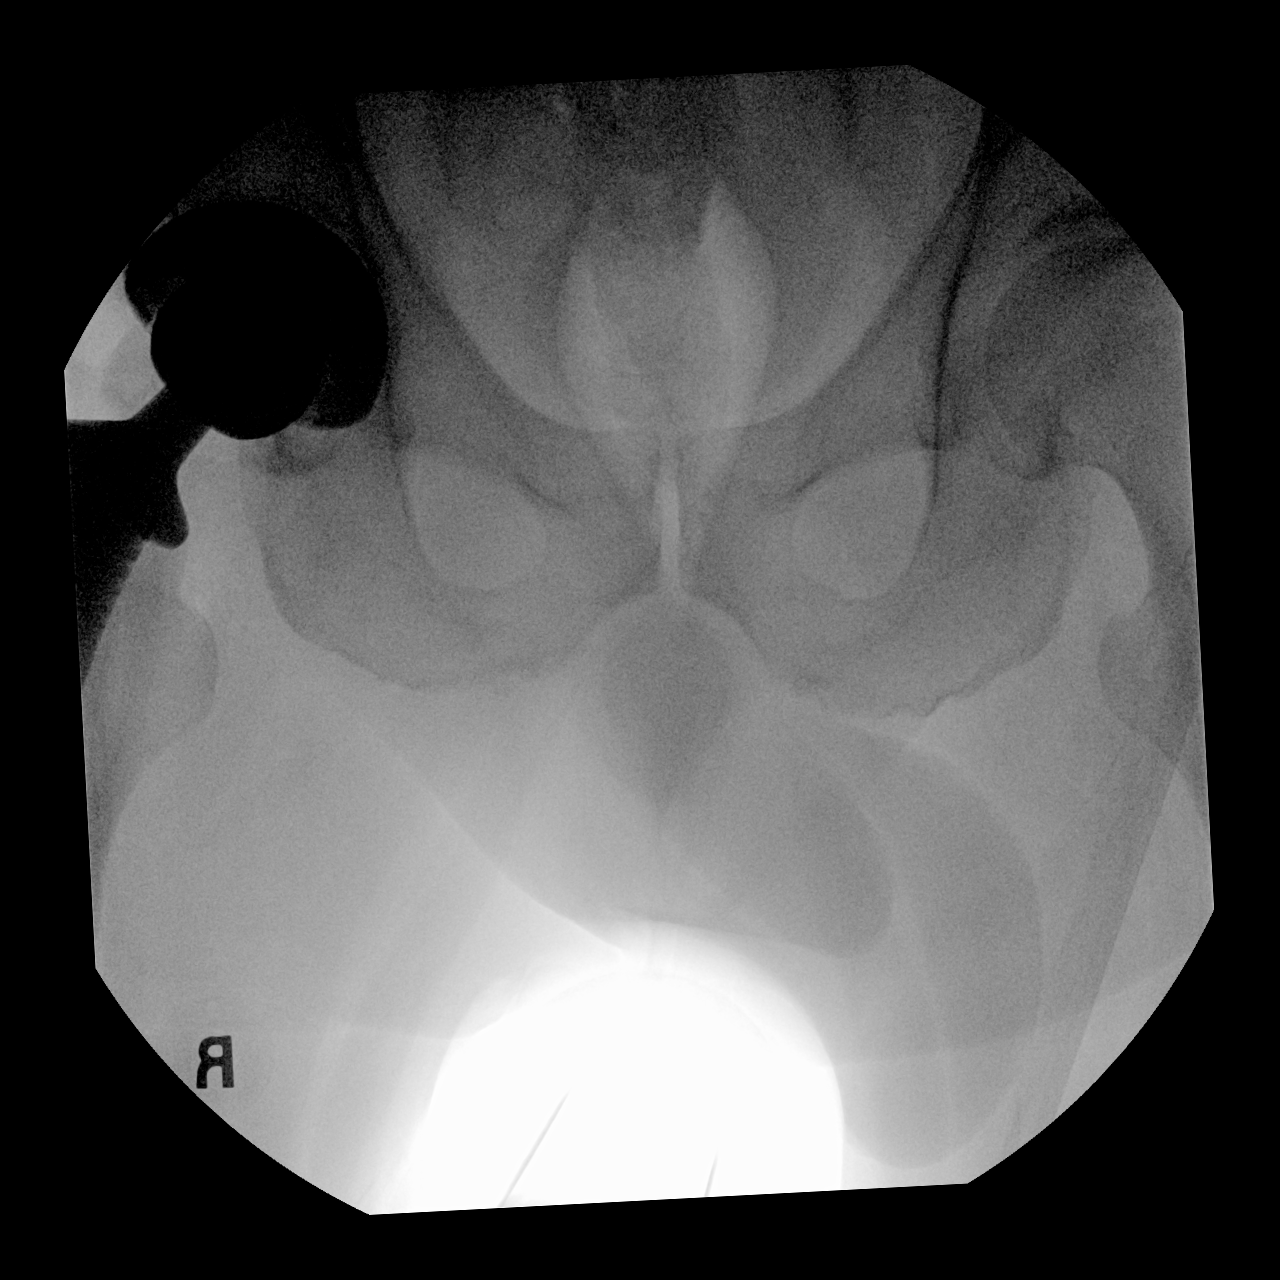
[im 3/3]
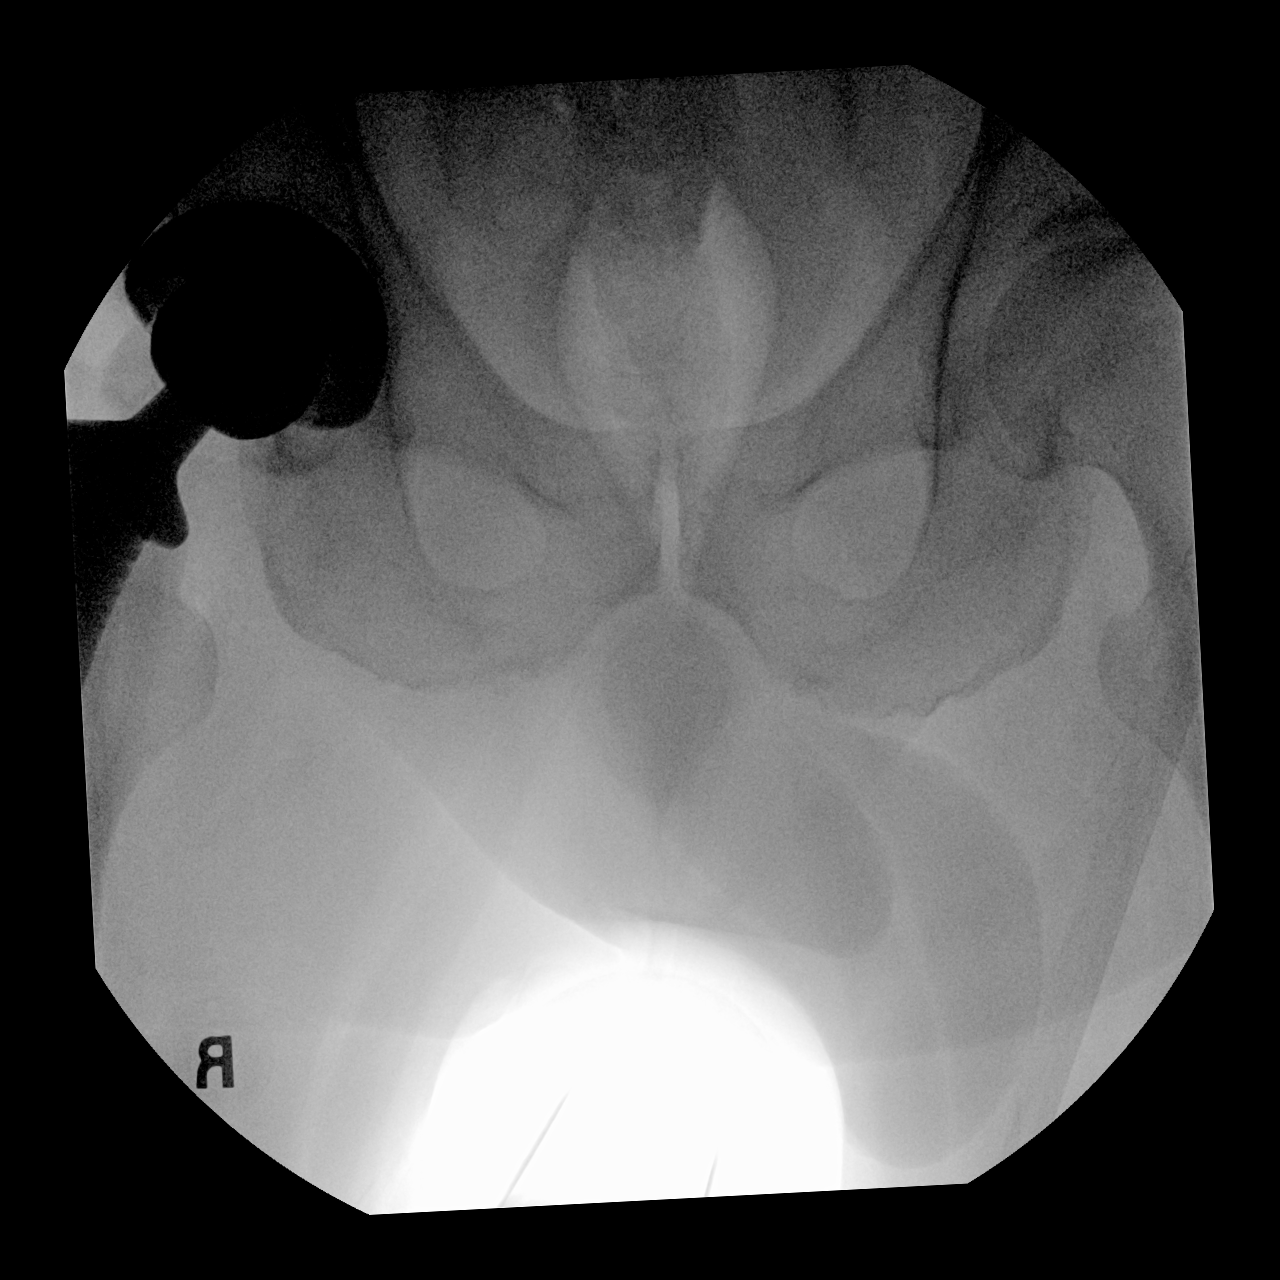

[3 of 3 positions shown; findings below may reference images not displayed]

FINDINGS: 3 fluoroscopic images are obtained during the performance of the
procedure and are provided for interpretation only. Right hip
arthroplasty is identified in the expected position without signs of
complication.
IMPRESSION: 1. Intraoperative exam as above.

## 2021-03-06 DIAGNOSIS — M79672 Pain in left foot: Secondary | ICD-10-CM | POA: Diagnosis not present

## 2021-03-10 IMAGING — DX DG CHEST 2V
2 series · 2 of 2 positions shown · non-contrast
Comparison: June 20, 2017.

CLINICAL DATA: Chest pain.

EXAM:
CHEST - 2 VIEW

[chest pa]
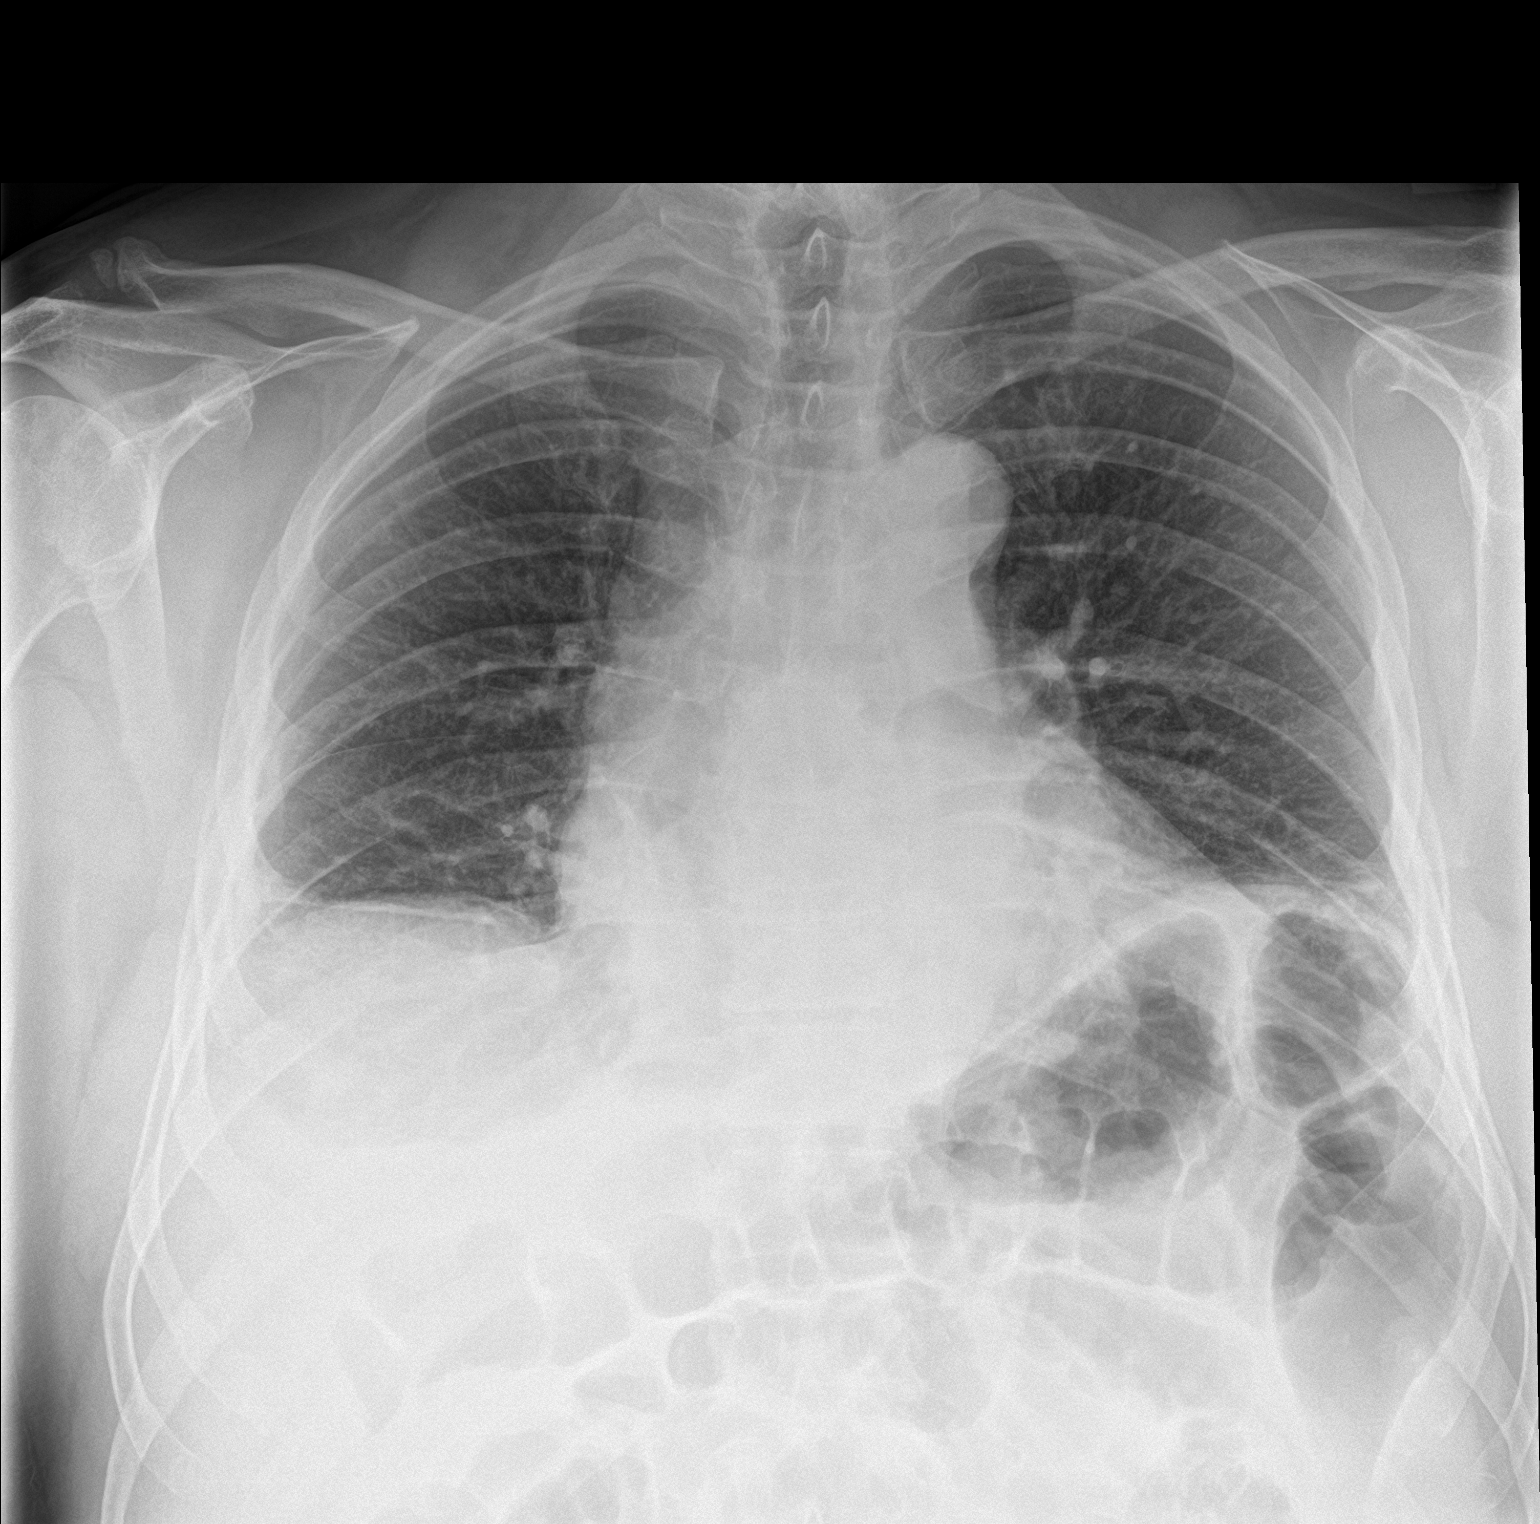

[chest lat]
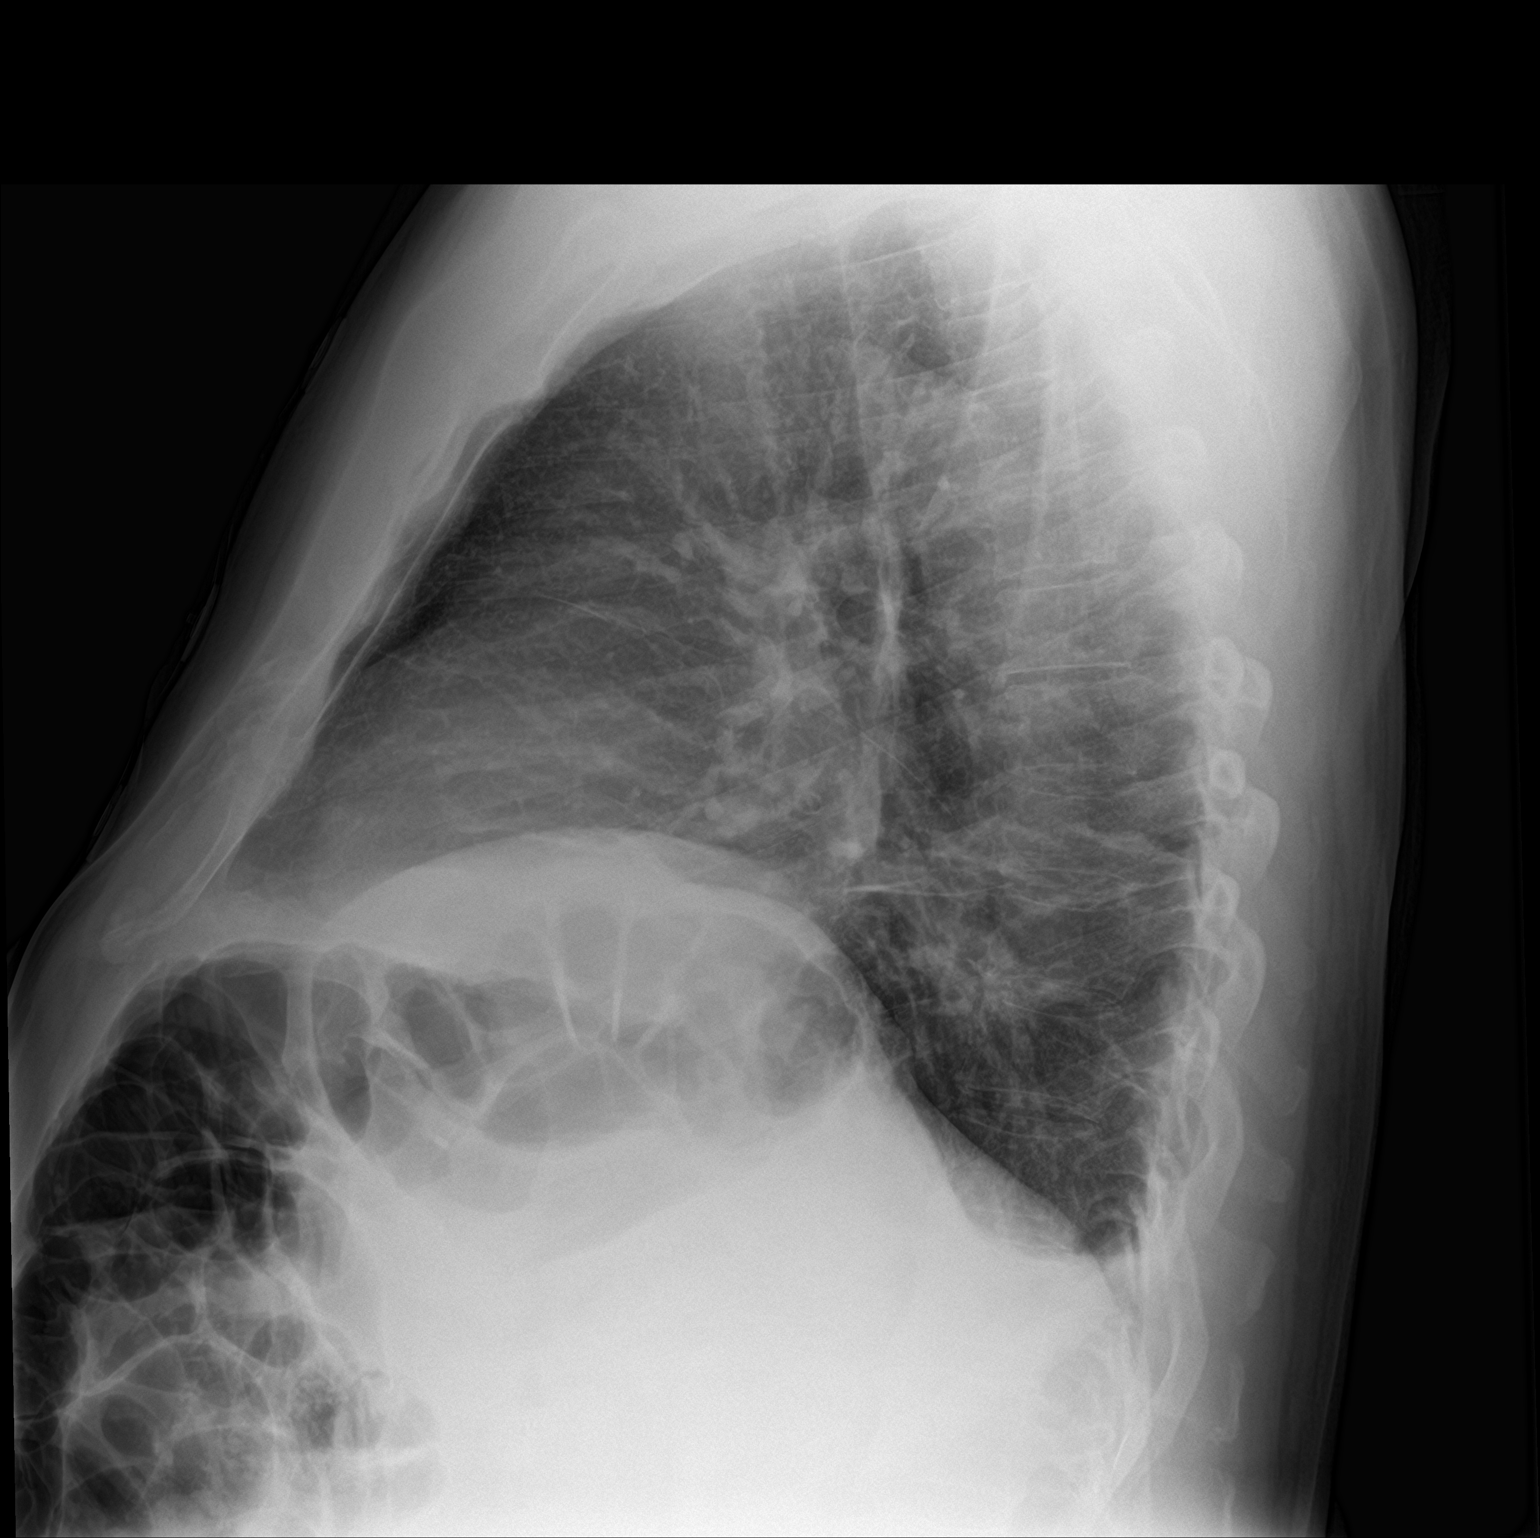

[2 of 2 positions shown; findings below may reference images not displayed]

FINDINGS: Stable cardiomediastinal silhouette. No pneumothorax is noted.
Hypoinflation of the lungs is noted with mild bibasilar subsegmental
atelectasis and possible small right pleural effusion. Bony thorax
is unremarkable.
IMPRESSION: Hypoinflation of the lungs with mild bibasilar subsegmental
atelectasis and possible small right pleural effusion.

## 2021-03-27 ENCOUNTER — Other Ambulatory Visit: Payer: Self-pay | Admitting: Orthopaedic Surgery

## 2021-03-27 DIAGNOSIS — M25572 Pain in left ankle and joints of left foot: Secondary | ICD-10-CM

## 2021-03-30 ENCOUNTER — Other Ambulatory Visit: Payer: Self-pay | Admitting: Orthopaedic Surgery

## 2021-03-30 DIAGNOSIS — M25572 Pain in left ankle and joints of left foot: Secondary | ICD-10-CM

## 2021-03-30 DIAGNOSIS — M79672 Pain in left foot: Secondary | ICD-10-CM

## 2021-04-06 ENCOUNTER — Other Ambulatory Visit: Payer: Self-pay

## 2021-04-06 ENCOUNTER — Ambulatory Visit (INDEPENDENT_AMBULATORY_CARE_PROVIDER_SITE_OTHER): Payer: BC Managed Care – PPO | Admitting: Podiatry

## 2021-04-06 ENCOUNTER — Ambulatory Visit (INDEPENDENT_AMBULATORY_CARE_PROVIDER_SITE_OTHER): Payer: BC Managed Care – PPO

## 2021-04-06 DIAGNOSIS — M7672 Peroneal tendinitis, left leg: Secondary | ICD-10-CM | POA: Diagnosis not present

## 2021-04-06 MED ORDER — BETAMETHASONE SOD PHOS & ACET 6 (3-3) MG/ML IJ SUSP: 3.0000 mg | Freq: Once | INTRAMUSCULAR | Status: DC

## 2021-04-06 MED ORDER — MELOXICAM 15 MG PO TABS: 15.0000 mg | ORAL_TABLET | Freq: Every day | ORAL | 1 refills | Status: DC

## 2021-04-06 NOTE — Progress Notes (Signed)
   HPI: 65 y.o. male presenting today for second opinion regarding pain and tenderness to the left foot.  Patient states that he does have a history of plantar fasciitis and heel pain to the left foot.  He went to [place] where he received a steroid injection and oral prednisone with meloxicam.  Patient states that his follow-up visit he then received an injection to the outside of the foot as well.  He says that although his toe pain has resolved he now is experiencing pain to the outside of the left foot.  He denies a history of injury.  He presents for further treatment and evaluation  Past Medical History:  Diagnosis Date   Arthritis    knees   Cancer ([place])    bladder cancer   GERD (gastroesophageal reflux disease)    Heart murmur    diagnosed with "athlete's murmur" at 17   History of kidney stones    Hyperlipidemia    Pure hypercholesterolemia    Sleep apnea    CPAP at night     Physical Exam: General: The patient is alert and oriented x3 in no acute distress.  Dermatology: Skin is warm, dry and supple bilateral lower extremities. Negative for open lesions or macerations.  Vascular: Palpable pedal pulses bilaterally. No edema or erythema noted. Capillary refill within normal limits.  Neurological: Epicritic and protective threshold grossly intact bilaterally.   Musculoskeletal Exam: Range of motion within normal limits to all pedal and ankle joints bilateral. Muscle strength 5/5 in all groups bilateral.  Pain on palpation along the peroneal tendon as it inserts onto the fifth metatarsal tubercle  Radiographic Exam:  Normal osseous mineralization. Joint spaces preserved. No fracture/dislocation/boney destruction.    Assessment: 1.  Peroneal tendinitis left 2. H/o plantar fasciitis left; resolved   Plan of Care:  1. Patient evaluated. X-Rays reviewed.  2.  Injection of 0.5 cc Celestone Soluspan injected along the insertional area of the peroneal tendon  left 3.  Prescription for meloxicam 15 mg daily 4.  Patient has a cam boot at home.  Recommend that he weightbears in the cam boot x4 weeks 5.  Return to clinic in 4 weeks  *[employer] fan.       Edrick Kins, DPM [facility]  Dr. Edrick Kins, DPM    [address], [place] [address]                Office [phone]  Fax [phone]

## 2021-04-27 DIAGNOSIS — L570 Actinic keratosis: Secondary | ICD-10-CM | POA: Diagnosis not present

## 2021-04-27 DIAGNOSIS — D485 Neoplasm of uncertain behavior of skin: Secondary | ICD-10-CM | POA: Diagnosis not present

## 2021-05-04 ENCOUNTER — Ambulatory Visit (INDEPENDENT_AMBULATORY_CARE_PROVIDER_SITE_OTHER): Payer: BC Managed Care – PPO | Admitting: Podiatry

## 2021-05-04 ENCOUNTER — Other Ambulatory Visit: Payer: Self-pay

## 2021-05-04 DIAGNOSIS — M7672 Peroneal tendinitis, left leg: Secondary | ICD-10-CM

## 2021-05-04 DIAGNOSIS — M722 Plantar fascial fibromatosis: Secondary | ICD-10-CM | POA: Diagnosis not present

## 2021-05-04 MED ORDER — MELOXICAM 15 MG PO TABS: 15.0000 mg | ORAL_TABLET | Freq: Every day | ORAL | 1 refills | Status: DC

## 2021-05-04 MED ORDER — BETAMETHASONE SOD PHOS & ACET 6 (3-3) MG/ML IJ SUSP
3.0000 mg | Freq: Once | INTRAMUSCULAR | Status: AC
  Administered 2021-05-04: 3 mg via INTRA_ARTICULAR

## 2021-05-04 NOTE — Progress Notes (Signed)
   HPI: 65 y.o. male presenting today for follow-up evaluation originally regarding a second opinion for left foot pain.  Patient states that he does have a history of plantar fasciitis and heel pain to the left foot.  He went to [place] where he received a steroid injection and oral prednisone with meloxicam.  Patient states that his follow-up visit he then received an injection to the outside of the foot as well.    Patient states that last visit the injection helped somewhat with the lateral foot pain.  He has been wearing the cam boot over the past 4 weeks.  He presents for follow-up treatment and evaluation  Past Medical History:  Diagnosis Date   Arthritis    knees   Cancer ([place])    bladder cancer   GERD (gastroesophageal reflux disease)    Heart murmur    diagnosed with "athlete's murmur" at 17   History of kidney stones    Hyperlipidemia    Pure hypercholesterolemia    Sleep apnea    CPAP at night     Physical Exam: General: The patient is alert and oriented x3 in no acute distress.  Dermatology: Skin is warm, dry and supple bilateral lower extremities. Negative for open lesions or macerations.  Vascular: Palpable pedal pulses bilaterally. No edema or erythema noted. Capillary refill within normal limits.  Neurological: Epicritic and protective threshold grossly intact bilaterally.   Musculoskeletal Exam: Range of motion within normal limits to all pedal and ankle joints bilateral. Muscle strength 5/5 in all groups bilateral.  Pain on palpation along the peroneal tendon as it inserts onto the fifth metatarsal tubercle as well as along the plantar fascia left foot and anterior portion of the ankle joint which is most symptomatic today  Radiographic Exam 04/06/2021:  Normal osseous mineralization. Joint spaces preserved. No fracture/dislocation/boney destruction.    Assessment: 1.  Peroneal tendinitis left 2.  Plantar fasciitis left 3.  Capsulitis left  tarsal bones just distal to the ankle joint anterior   Plan of Care:  1. Patient evaluated. X-Rays reviewed.  2.  Injection of 0.5 cc Celestone Soluspan injected just distal to the ankle joint anterior aspect of the left foot  3.  Continue meloxicam 15 mg daily.  Refill provided 4.  Continue cam boot as needed 5.  Order placed today for MRI left foot.  The patient has not had this pain for 3-4 months without any improvement.  When he left Dr. Pollie Friar office they were working on ordering an MRI. New order placed today for MRI.  6.  Return to clinic after MRI to review results and discuss different treatment options  *[employer] fan.       Edrick Kins, DPM [facility]  Dr. Edrick Kins, DPM    [address], [place] [address]                Office [phone]  Fax [phone]

## 2021-05-11 DIAGNOSIS — R5381 Other malaise: Secondary | ICD-10-CM | POA: Diagnosis not present

## 2021-05-11 DIAGNOSIS — E291 Testicular hypofunction: Secondary | ICD-10-CM | POA: Diagnosis not present

## 2021-05-11 DIAGNOSIS — K589 Irritable bowel syndrome without diarrhea: Secondary | ICD-10-CM | POA: Diagnosis not present

## 2021-05-11 DIAGNOSIS — R7309 Other abnormal glucose: Secondary | ICD-10-CM | POA: Diagnosis not present

## 2021-05-20 DIAGNOSIS — N329 Bladder disorder, unspecified: Secondary | ICD-10-CM | POA: Diagnosis not present

## 2021-05-20 DIAGNOSIS — J069 Acute upper respiratory infection, unspecified: Secondary | ICD-10-CM | POA: Diagnosis not present

## 2021-05-20 DIAGNOSIS — R5383 Other fatigue: Secondary | ICD-10-CM | POA: Diagnosis not present

## 2021-05-27 ENCOUNTER — Ambulatory Visit
Admission: RE | Admit: 2021-05-27 | Discharge: 2021-05-27 | Disposition: A | Discharge status: Home / Self Care | Payer: BC Managed Care – PPO | Source: Ambulatory Visit | Attending: Podiatry | Admitting: Podiatry

## 2021-05-27 ENCOUNTER — Other Ambulatory Visit: Payer: Self-pay

## 2021-05-27 DIAGNOSIS — M722 Plantar fascial fibromatosis: Secondary | ICD-10-CM

## 2021-05-27 DIAGNOSIS — S92215A Nondisplaced fracture of cuboid bone of left foot, initial encounter for closed fracture: Secondary | ICD-10-CM | POA: Diagnosis not present

## 2021-05-27 DIAGNOSIS — M65872 Other synovitis and tenosynovitis, left ankle and foot: Secondary | ICD-10-CM | POA: Diagnosis not present

## 2021-05-27 DIAGNOSIS — M7672 Peroneal tendinitis, left leg: Secondary | ICD-10-CM

## 2021-05-29 ENCOUNTER — Telehealth: Payer: Self-pay | Admitting: Podiatry

## 2021-05-29 NOTE — Telephone Encounter (Signed)
Patient is calling for the results for his MRI , he is still in a lot of pain and would like to know where his treatment goes from here . Please advise.

## 2021-06-01 NOTE — Telephone Encounter (Signed)
Patient called again  for the results to MRI

## 2021-06-02 ENCOUNTER — Telehealth: Payer: Self-pay | Admitting: Podiatry

## 2021-06-02 NOTE — Telephone Encounter (Signed)
Spoke with the patient on the phone.  MRI results were reviewed with the patient.  He is going to stop by the office tomorrow to pick up a short walking cam boot.  Thanks, Dr. Amalia Hailey

## 2021-06-02 NOTE — Telephone Encounter (Signed)
Pt called back requesting his MRI results. He would like to know if he just needs to come in for an appt. Please advise.

## 2021-06-04 NOTE — Telephone Encounter (Signed)
Spoke with patient yesterday to discuss the MRI results over the phone.  All questions answered.  MRI reviewed.  Plan to be weightbearing in a cam boot x4-6 weeks.  Return to clinic 4 weeks.  Thanks, Dr. Amalia Hailey

## 2021-07-08 ENCOUNTER — Other Ambulatory Visit: Payer: Self-pay

## 2021-07-08 ENCOUNTER — Ambulatory Visit (INDEPENDENT_AMBULATORY_CARE_PROVIDER_SITE_OTHER): Payer: BC Managed Care – PPO | Admitting: Podiatry

## 2021-07-08 DIAGNOSIS — M7672 Peroneal tendinitis, left leg: Secondary | ICD-10-CM | POA: Diagnosis not present

## 2021-07-14 NOTE — Progress Notes (Signed)
HPI: 66 y.o. male presenting today for follow-up evaluation of left foot pain that he states initially began as plantar fasciitis to the left heel.  He has been treated prior to me at [place] where he received a steroid injection and oral prednisone with meloxicam.  The heel pain subsequently resolved and he began to develop lateral foot pain.  He received a injection also to the lateral aspect of his foot on his follow-up visit with [facility] orthopedics.  MRI was ordered last visit and discussed over the phone.  He has been wearing the cam boot since discussion over the phone about 4 weeks ago.  He presents for follow-up treatment evaluation.  He says that he is minimally better and he continues to have some pain and tenderness associated to the foot.  Past Medical History:  Diagnosis Date   Arthritis    knees   Cancer ([place])    bladder cancer   GERD (gastroesophageal reflux disease)    Heart murmur    diagnosed with "athlete's murmur" at 17   History of kidney stones    Hyperlipidemia    Pure hypercholesterolemia    Sleep apnea    CPAP at night    Past Surgical History:  Procedure Laterality Date   INGUINAL HERNIA REPAIR     IRRIGATION AND DEBRIDEMENT SEBACEOUS CYST     KNEE SURGERY Left 2013   SHOULDER SURGERY Right 2013   TOTAL HIP ARTHROPLASTY Right 11/20/2019   Procedure: RIGHT TOTAL HIP ARTHROPLASTY ANTERIOR APPROACH;  Surgeon: Mcarthur Rossetti, MD;  Location: [place];  Service: Orthopedics;  Laterality: Right;   WISDOM TOOTH EXTRACTION      Allergies  Allergen Reactions   Bee Venom Anaphylaxis   Prednisone     REACTION: "drove me nutty"     Physical Exam: General: The patient is alert and oriented x3 in no acute distress.  Dermatology: Skin is warm, dry and supple bilateral lower extremities. Negative for open lesions or macerations.  Vascular: Palpable pedal pulses bilaterally. Capillary refill within normal limits.  Negative for any  significant edema or erythema  Neurological: Light touch and protective threshold grossly intact  Musculoskeletal Exam: There continues to be some pain on palpation throughout the lateral aspect of the left foot along the peroneal tendons and the cuboid region  MRI LT foot WO contrast 05/27/2021:  IMPRESSION: 1. Nondisplaced fracture of the cuboid bone. 2. Mild focal bone marrow edema along the plantar aspect of the navicular without a discernible fracture line. Findings may represent a stress reaction. 3. The plantar cuneonavicular ligament is heterogeneous and edematous, at least partially torn. 4. Mild tendinosis and tenosynovitis of the distal peroneus longus tendon.  Assessment: 1.  Nondisplaced fracture cuboid LT 2.  Bone marrow edema tarsal bones LT 3.  Peroneal tendinosis with tenosynovitis LT   Plan of Care:  1. Patient evaluated.  MRI was reviewed again today with the patient in detail.  2.  Explained to the patient that there is nothing surgical to offer at the moment.  He needs to stay in the cam boot to allow for healing of the nondisplaced fracture of the cuboid and allow the bone marrow edema and tenosynovitis to resolve with time.  Patient understands. 3.  Continue weightbearing in the cam boot x6 weeks.  We did discuss possible nonweightbearing however the patient declined 4.  Return to clinic in 6 weeks      Edrick Kins, [place]  Dr. Edrick Kins, DPM    [address], [place] [address]                Office [phone]  Fax [phone]

## 2021-08-12 DIAGNOSIS — R5381 Other malaise: Secondary | ICD-10-CM | POA: Diagnosis not present

## 2021-08-12 DIAGNOSIS — E039 Hypothyroidism, unspecified: Secondary | ICD-10-CM | POA: Diagnosis not present

## 2021-08-12 DIAGNOSIS — E291 Testicular hypofunction: Secondary | ICD-10-CM | POA: Diagnosis not present

## 2021-08-12 DIAGNOSIS — R7309 Other abnormal glucose: Secondary | ICD-10-CM | POA: Diagnosis not present

## 2021-08-18 DIAGNOSIS — R5383 Other fatigue: Secondary | ICD-10-CM | POA: Diagnosis not present

## 2021-08-18 DIAGNOSIS — N329 Bladder disorder, unspecified: Secondary | ICD-10-CM | POA: Diagnosis not present

## 2021-08-18 DIAGNOSIS — J069 Acute upper respiratory infection, unspecified: Secondary | ICD-10-CM | POA: Diagnosis not present

## 2021-08-19 ENCOUNTER — Ambulatory Visit (INDEPENDENT_AMBULATORY_CARE_PROVIDER_SITE_OTHER): Payer: BC Managed Care – PPO | Admitting: Podiatry

## 2021-08-19 ENCOUNTER — Other Ambulatory Visit: Payer: Self-pay

## 2021-08-19 DIAGNOSIS — M7752 Other enthesopathy of left foot: Secondary | ICD-10-CM

## 2021-08-19 MED ORDER — BETAMETHASONE SOD PHOS & ACET 6 (3-3) MG/ML IJ SUSP
3.0000 mg | Freq: Once | INTRAMUSCULAR | Status: AC
  Administered 2021-08-19: 3 mg via INTRA_ARTICULAR

## 2021-08-19 NOTE — Progress Notes (Signed)
? ?HPI: 66 y.o. male presenting today for follow-up evaluation of left foot pain.  Patient states that he wore the cam boot for the last 6 weeks he is much as possible.  He says there is been no significant improvement.  He continues to have some pain and tenderness to the left foot and ankle.  He says that the majority the pain has shifted now mostly to his left ankle.  Currently he is wearing [place]oka shoes today.  He presents for further treatment and evaluation ? ?Past Medical History:  ?Diagnosis Date  ? Arthritis   ? knees  ? Cancer [facility])   ? bladder cancer  ? GERD (gastroesophageal reflux disease)   ? Heart murmur   ? diagnosed with "athlete's murmur" at 17  ? History of kidney stones   ? Hyperlipidemia   ? Pure hypercholesterolemia   ? Sleep apnea   ? CPAP at night  ? ? ?Past Surgical History:  ?Procedure Laterality Date  ? INGUINAL HERNIA REPAIR    ? IRRIGATION AND DEBRIDEMENT SEBACEOUS CYST    ? KNEE SURGERY Left 2013  ? SHOULDER SURGERY Right 2013  ? TOTAL HIP ARTHROPLASTY Right 11/20/2019  ? Procedure: RIGHT TOTAL HIP ARTHROPLASTY ANTERIOR APPROACH;  Surgeon: Mcarthur Rossetti, MD;  Location: [facility];  Service: Orthopedics;  Laterality: Right;  ? WISDOM TOOTH EXTRACTION    ? ? ?Allergies  ?Allergen Reactions  ? Bee Venom Anaphylaxis  ? Prednisone   ?  REACTION: "drove me nutty"  ? ?  ?Physical Exam: ?General: The patient is alert and oriented x3 in no acute distress. ? ?Dermatology: Skin is warm, dry and supple bilateral lower extremities. Negative for open lesions or macerations. ? ?Vascular: Palpable pedal pulses bilaterally. Capillary refill within normal limits.  Negative for any significant edema or erythema ? ?Neurological: Light touch and protective threshold grossly intact ? ?Musculoskeletal Exam: It appears that the patient's pain now is not related to the midfoot or lateral aspect of the foot.  The pain seems to be mostly onto the lateral aspect of the left ankle joint as well as to a lesser  extent along the peroneal tendons just posterior to the lateral malleolus. ? ?MRI LT foot WO contrast 05/27/2021:  ?IMPRESSION: ?1. Nondisplaced fracture of the cuboid bone. ?2. Mild focal bone marrow edema along the plantar aspect of the ?navicular without a discernible fracture line. Findings may ?represent a stress reaction. ?3. The plantar cuneonavicular ligament is heterogeneous and ?edematous, at least partially torn. ?4. Mild tendinosis and tenosynovitis of the distal peroneus longus ?tendon. ? ?Assessment: ?1.  Nondisplaced fracture cuboid LT ?2.  Bone marrow edema tarsal bones LT ?3.  Peroneal tendinosis with tenosynovitis LT ?4.  Capsulitis left ankle ? ? ?Plan of Care:  ?1. Patient evaluated.   ?2.  Injection of 0.5 cc Celestone Soluspan injected in the left ankle joint lateral aspect ?3.  The patient may discontinue the cam boot.  He has been in the cam boot for several months now even prior to seeing me initially and has been no significant improvement.  Today the patient is wearing Hoka shoes.  Continue ?4.  We did discuss the possibility of physical therapy however the patient is very busy with work and is afraid he is unable to do the physical therapy consistently due to work demands ?5.  Patient will contact our office in 2 weeks to let us know if the injection into the lateral aspect of the left ankle has helped.  If there is no significant improvement I believe the neck step would be for the patient to get a second opinion with sports medicine and possible diagnostic ultrasound. ?6.  Return to clinic as needed ? ?*Goes by El[facility] ?  ?  ?Edrick Kins, DPM ?[facility] ? ?Dr. Edrick Kins, DPM  ?  ?[address]                                        ?[place], [place] [address]                ?Office [phone]  ?Fax [phone] ? ? ? ? ?

## 2021-08-25 ENCOUNTER — Telehealth: Payer: Self-pay | Admitting: Podiatry

## 2021-08-25 NOTE — Telephone Encounter (Signed)
Pt is calling and states that his pain in the left ankle is not getting any better. He was told that if pain wasn't any better, Dr Amalia Hailey would reach out to another provider for second opinion withi in sports meds/orthopedics.  ? ?Please advise. ?

## 2021-08-27 ENCOUNTER — Other Ambulatory Visit: Payer: Self-pay | Admitting: Podiatry

## 2021-08-27 ENCOUNTER — Telehealth: Payer: Self-pay | Admitting: *Deleted

## 2021-08-27 NOTE — Telephone Encounter (Signed)
Referral placed to [facility]. Please notify patient. - Dr. Amalia Hailey

## 2021-08-27 NOTE — Telephone Encounter (Signed)
Patient is calling to let the physician know that he is still having problems with his ankle, no relief, wanted to discuss the referral for a sports medicine doctor. ?

## 2021-08-28 NOTE — Telephone Encounter (Signed)
Referral placed to sports medicine.  Thanks, Dr. Amalia Hailey

## 2021-08-28 NOTE — Progress Notes (Signed)
? ? ?Subjective:   ? ?CC: L foot pain / cuboid fx ? ?I, [name], LAT, ATC, am serving as scribe for Dr. [name]. ? ?HPI: Pt is a 66 y/o male presenting w/ c/o L foot and ankle pain due to a cuboid fx and peroneal tendinopathy.  He has been seeing Dr. Daylene Katayama at [facility] and would like a 2nd opinion today.  He reports L foot/ankle pain since Aug/Sept 2022.  He locates his pain to his L lateral foot and ankle.  He states that his symptoms started w/ plantar fasciitis and then progressed to his L lateral ankle and foot. ? ?Swelling: unsure ?Aggravating factors: walking; prolonged standing ?Treatments tried: CAM walker boot; changed shoes; L ankle steroid injection (08/19/21); meloxicam ? ?Diagnostic testing: L foot MRI- 05/27/21; L foot XR- 04/06/21 ? ?Pertinent review of Systems: No fevers or chills ? ?Relevant historical information: Cerebral pulmonary embolism.  History of bladder cancer. ?He has a [employer] and is extremely busy and would struggle to find time to go to physical therapy more than once a week. ? ? ?Objective:   ? ?Vitals:  ? 08/31/21 0900  ?BP: (!) 148/98  ?Pulse: 78  ?SpO2: 95%  ? ?General: Well Developed, well nourished, and in no acute distress.  ? ?MSK: Left foot and ankle: Slight swelling at lateral malleolus area especially posterior to the lateral malleolus. ?Tender palpation the cuboid along the course of the peroneal tendon. ?Pain with resisted foot eversion. ? ?Lab and Radiology Results ? ?Procedure: Real-time Ultrasound Guided Injection of left peroneal tendon tendon sheath ?Device: Philips Affiniti 50G ?Images permanently stored and available for review in PACS ?Ultrasound evaluation prior to injection shows significant hypoechoic fluid tracking within the tendon sheath of the peroneal tendons just distal to the lateral malleolus.  The tendons themselves appear to be grossly intact and do not have a large full-thickness tear but may have longitudinal  split tears. ?Verbal informed consent obtained.  Discussed risks and benefits of procedure. Warned about infection bleeding damage to structures skin hypopigmentation and fat atrophy among others. ?Patient expresses understanding and agreement ?Time-out conducted.   ?Noted no overlying erythema, induration, or other signs of local infection.   ?Skin prepped in a sterile fashion.   ?Local anesthesia: Topical Ethyl chloride.   ?With sterile technique and under real time ultrasound guidance: 40 mg of Kenalog and 2 L of Marcaine injected into peroneal tendon sheath. Fluid seen entering the tendon sheath.   ?Completed without difficulty   ?Pain immediately resolved suggesting accurate placement of the medication.   ?Advised to call if fevers/chills, erythema, induration, drainage, or persistent bleeding.   ?Images permanently stored and available for review in the ultrasound unit.  ?Impression: Technically successful ultrasound guided injection. ? ? ?X-ray images left ankle obtained today personally and independently interpreted ?Compartment degenerative changes.  No acute fractures are visible. ?Await formal radiology review ? ? ? ?EXAM: ?MRI OF THE LEFT FOOT WITHOUT CONTRAST ?  ?TECHNIQUE: ?Multiplanar, multisequence MR imaging of the left foot was ?performed. No intravenous contrast was administered. ?  ?COMPARISON:  X-ray foot 04/06/2021. ?  ?FINDINGS: ?Bones/Joint/Cartilage ?  ?Extensive bone marrow edema throughout the cuboid with linear T1/T2 ?hypointense component extending longitudinally along the length of ?the cuboid bone most compatible with a nondisplaced fracture (series ?9, images 13-15). There is mild focal bone marrow edema along the ?plantar aspect of the navicular without a discernible fracture line. ?  ?Remaining osseous structures appear intact.  No additional fractures. ?No malalignment. Mild osteoarthritis of the first MTP joint. No ?erosion. ?  ?Ligaments ?  ?The plantar cuneonavicular ligament is  heterogeneous and edematous, ?at least partially torn (series 5, images 1-2). Remaining visualized ?ligamentous structures of the foot are intact. ?  ?Muscles and Tendons ?  ?Intramuscular edema within the plantar foot musculature at the ?midfoot and medial aspect of the forefoot. Intact flexor and ?extensor tendons. Mild tendinosis and tenosynovitis of the distal ?peroneus longus tendon. ?  ?Soft tissues ?  ?Mild soft tissue edema at the level of the midfoot. No fluid ?collection. No ulceration. ?  ?IMPRESSION: ?1. Nondisplaced fracture of the cuboid bone. ?2. Mild focal bone marrow edema along the plantar aspect of the ?navicular without a discernible fracture line. Findings may ?represent a stress reaction. ?3. The plantar cuneonavicular ligament is heterogeneous and ?edematous, at least partially torn. ?4. Mild tendinosis and tenosynovitis of the distal peroneus longus ?tendon. ?  ?  ?Electronically Signed ?  By: Davina Poke D.O. ?  On: 05/28/2021 12:37 ?I, [name], personally (independently) visualized and performed the interpretation of the images attached in this note. ? ? ? ? ?Impression and Recommendations:   ? ?Assessment and Plan: ?66 y.o. male with left lateral ankle and midfoot pain. ?He had a foot MRI in December 2022 that shows what I think is probably a stress fracture of the cuboid as well as bone edema of the other midfoot bones as well as tendinitis of the peroneal tendons.  His MSK ultrasound shows much more significant tenosynovitis of the peroneal tendons more proximally than we can see on the foot MRI. ? ?Discussed treatment plan and options.  Plan for injection as above.  If this does not work well enough we will proceed directly to MRI for surgical versus physical therapy planning.  His ultrasound looks bad enough that a tear is certainly a possibility and before we send him to physical therapy I would like to be more clear what the actual diagnosis as.  I do not want to waste his  time with physical therapy if its not going to be helpful. ? ?He will let me know how he is doing.  Recheck in a month.  Okay to proceed to MRI sooner if this injection is not helpful. ? ?CC: Dr Amalia Hailey DPM ?PDMP not reviewed this encounter. ?Orders Placed This Encounter  ?Procedures  ? [place] LIMITED JOINT SPACE STRUCTURES LOW LEFT(NO LINKED CHARGES)  ?  Order Specific Question:   Reason for Exam (SYMPTOM  OR DIAGNOSIS REQUIRED)  ?  Answer:   L ankle pain  ?  Order Specific Question:   Preferred imaging location?  ?  Answer:   [place]  ? DG Ankle Complete Left  ?  Standing Status:   Future  ?  Number of Occurrences:   1  ?  Standing Expiration Date:   10/01/2021  ?  Order Specific Question:   Reason for Exam (SYMPTOM  OR DIAGNOSIS REQUIRED)  ?  Answer:   L ankle pain  ?  Order Specific Question:   Preferred imaging location?  ?  Answer:   [facility]  ? ?No orders of the defined types were placed in this encounter. ? ? ?Discussed warning signs or symptoms. Please see discharge instructions. Patient expresses understanding. ? ? ?The above documentation has been reviewed and is accurate and complete [name], M.D. ? ?

## 2021-08-31 ENCOUNTER — Encounter: Payer: Self-pay | Admitting: Family Medicine

## 2021-08-31 ENCOUNTER — Ambulatory Visit (INDEPENDENT_AMBULATORY_CARE_PROVIDER_SITE_OTHER): Payer: BC Managed Care – PPO | Admitting: Family Medicine

## 2021-08-31 ENCOUNTER — Ambulatory Visit (INDEPENDENT_AMBULATORY_CARE_PROVIDER_SITE_OTHER): Payer: BC Managed Care – PPO

## 2021-08-31 ENCOUNTER — Other Ambulatory Visit: Payer: Self-pay

## 2021-08-31 ENCOUNTER — Ambulatory Visit: Payer: Self-pay

## 2021-08-31 VITALS — BP 148/98 | HR 78 | Ht 75.0 in | Wt 256.4 lb

## 2021-08-31 DIAGNOSIS — M7672 Peroneal tendinitis, left leg: Secondary | ICD-10-CM | POA: Diagnosis not present

## 2021-08-31 DIAGNOSIS — M25572 Pain in left ankle and joints of left foot: Secondary | ICD-10-CM

## 2021-08-31 DIAGNOSIS — G8929 Other chronic pain: Secondary | ICD-10-CM | POA: Diagnosis not present

## 2021-08-31 DIAGNOSIS — M79672 Pain in left foot: Secondary | ICD-10-CM

## 2021-08-31 NOTE — Patient Instructions (Addendum)
Nice to meet you today. ? ?You had a L peroneal tendon injection.  Call or go to the ER if you develop a large red swollen joint with extreme pain or oozing puss.  ? ?I recommend you obtained a compression sleeve to help with your joint problems. There are many options on the market however I recommend obtaining a ankle Body Helix compression sleeve.  You can find information (including how to appropriate measure yourself for sizing) can be found at www.Body http://www.lambert.com/.  Many of these products are health savings account (HSA) eligible.  ? ?You can use the compression sleeve at any time throughout the day but is most important to use while being active as well as for 2 hours post-activity.   It is appropriate to ice following activity with the compression sleeve in place.  ? ?Please get an Xray today before you leave. ? ?Let me know if you don't feel like you're improving in the next 2 weeks and I'll order an MRI or your ankle. ? ?Follow-up: 4 weeks ?

## 2021-09-02 NOTE — Progress Notes (Signed)
Left ankle x-ray shows some issues along the tibia which is on the inside part of the ankle where you do not have this much problems.  Radiology does recommend an MRI which we are doing anyway. ? ?MRI will tell us more information.

## 2021-09-28 ENCOUNTER — Ambulatory Visit: Payer: BC Managed Care – PPO | Admitting: Family Medicine

## 2021-10-27 ENCOUNTER — Ambulatory Visit (INDEPENDENT_AMBULATORY_CARE_PROVIDER_SITE_OTHER): Payer: BC Managed Care – PPO | Admitting: Family Medicine

## 2021-10-27 VITALS — BP 182/104 | HR 66 | Ht 75.0 in | Wt 246.8 lb

## 2021-10-27 DIAGNOSIS — L570 Actinic keratosis: Secondary | ICD-10-CM | POA: Diagnosis not present

## 2021-10-27 DIAGNOSIS — G8929 Other chronic pain: Secondary | ICD-10-CM | POA: Diagnosis not present

## 2021-10-27 DIAGNOSIS — M25572 Pain in left ankle and joints of left foot: Secondary | ICD-10-CM | POA: Diagnosis not present

## 2021-10-27 DIAGNOSIS — L820 Inflamed seborrheic keratosis: Secondary | ICD-10-CM | POA: Diagnosis not present

## 2021-10-27 DIAGNOSIS — D1801 Hemangioma of skin and subcutaneous tissue: Secondary | ICD-10-CM | POA: Diagnosis not present

## 2021-10-27 DIAGNOSIS — D0439 Carcinoma in situ of skin of other parts of face: Secondary | ICD-10-CM | POA: Diagnosis not present

## 2021-10-27 DIAGNOSIS — L738 Other specified follicular disorders: Secondary | ICD-10-CM | POA: Diagnosis not present

## 2021-10-27 DIAGNOSIS — D044 Carcinoma in situ of skin of scalp and neck: Secondary | ICD-10-CM | POA: Diagnosis not present

## 2021-10-27 DIAGNOSIS — Z85828 Personal history of other malignant neoplasm of skin: Secondary | ICD-10-CM | POA: Diagnosis not present

## 2021-10-27 MED ORDER — PREDNISONE 10 MG PO TABS: 30.0000 mg | ORAL_TABLET | Freq: Every day | ORAL | 0 refills | Status: DC

## 2021-10-27 NOTE — Progress Notes (Signed)
? ?I, Peterson Lombard, LAT, ATC acting as a scribe for [name], MD. ? ?Jerek Meulemans is a 66 y.o. male who presents to [place] [facility] today for f/u L lateral ankle and midfoot pain. L foot MRI from 05/27/21 showed a probably a stress fracture of the cuboid, bone edema of the other midfoot bones, and tendinitis of the peroneal tendons. Pt was last seen by Dr. Georgina Snell on 08/31/21 and was given a L peroneal tendon steroid injection and consider future MRI. Today, pt reports he got about 1 month of relief from steroid injections.  He has a trip coming up this weekend which will require a lot of walking.  He is willing to consider surgery. ? ?Dx imaging: 08/31/21 L ankle XR ? 05/27/21 L foot MRI ? 04/06/21 L foot XR ? ?Pertinent review of systems: No fevers or chills ? ?Relevant historical information: History of pulmonary embolism and history of bladder cancer. ? ? ?Exam:  ?BP (!) 182/104   Pulse 66   Ht '6\' 3"'$  (1.905 m)   Wt 246 lb 12.8 oz (111.9 kg)   SpO2 96%   BMI 30.85 kg/m?  ?General: Well Developed, well nourished, and in no acute distress.  ? ?MSK: Left ankle: Tender palpation along the course of the peroneal tendon.  Pain with resisted foot eversion. ? ? ? ?Lab and Radiology Results ?EXAM: ?LEFT ANKLE COMPLETE - 3+ VIEW ?  ?COMPARISON:  MRI 05/27/2021.  X-ray images 04/06/2021. ?  ?FINDINGS: ?Questionable faint rounded lucency noted the distal left tibia. ?Adjacent cortical and periosteal thickening noted. These changes may ?be related to prior injury. To exclude a focal distal left tibial ?lesion MRI of the left ankle/distal tibia is suggested. Malleoli are ?intact. Previously noted cuboid fracture not identified. Mild soft ?tissue swelling cannot be excluded. No radiopaque foreign body. ?  ?IMPRESSION: ?Questionable faint rounded lucency noted in the distal left tibia. ?Adjacent cortical and periosteal thickening noted. These changes may ?be related to prior injury. To  exclude a focal distal left tibial ?lesion MRI of the left ankle/distal tibia suggested. No other focal ?bony abnormalities identified. Previously noted cuboid fracture not ?identified. Mild soft tissue swelling cannot be excluded. ?  ?  ?Electronically Signed ?  By: Marcello Moores  Register M.D. ?  On: 09/02/2021 08:44 ? ?I, [name], personally (independently) visualized and performed the interpretation of the images attached in this note. ? ? ?Assessment and Plan: ?66 y.o. male with left ankle pain thought to be due to peroneal tendinitis.  There additionally was concern for peroneal tendon tear.  Unfortunately he did not have long-lasting benefit from steroid injection.  Plan for MRI to further characterize source of pain and for potential surgical planning or potential physical therapy planning. ? ?As for this weekend he is too soon for repeat injection.  Plan for cam walker boot and for limited prednisone.  He mentions that he got jittery with prednisone previously.  We will use low-dose short duration prednisone and advised him to stop it if he starts feeling worse.  He agrees with this plan. ? ? ?PDMP not reviewed this encounter. ?Orders Placed This Encounter  ?Procedures  ? MR ANKLE LEFT WO CONTRAST  ?  Standing Status:   Future  ?  Standing Expiration Date:   10/28/2022  ?  Order Specific Question:   What is the patient's sedation requirement?  ?  Answer:   No Sedation  ?  Order Specific Question:   Does the  patient have a pacemaker or implanted devices?  ?  Answer:   No  ?  Order Specific Question:   Preferred imaging location?  ?  Answer:   [facility] (table limit-350lbs)  ? ?Meds ordered this encounter  ?Medications  ? predniSONE (DELTASONE) 10 MG tablet  ?  Sig: Take 3 tablets (30 mg total) by mouth daily with breakfast.  ?  Dispense:  15 tablet  ?  Refill:  0  ? ? ? ?Discussed warning signs or symptoms. Please see discharge instructions. Patient expresses understanding. ? ? ?The above  documentation has been reviewed and is accurate and complete [name], M.D. ? ? ?

## 2021-10-27 NOTE — Patient Instructions (Addendum)
Thank you for coming in today.  ? ?You should hear from MRI scheduling within 1 week. If you do not hear please let me know.   ? ?Check back after MRI ? ?Cam walker boot as needed.  ? ?Prednisone as needed. Ok to quit it early.  ? ? ? ?

## 2021-11-03 ENCOUNTER — Ambulatory Visit (INDEPENDENT_AMBULATORY_CARE_PROVIDER_SITE_OTHER): Payer: BC Managed Care – PPO

## 2021-11-03 DIAGNOSIS — G8929 Other chronic pain: Secondary | ICD-10-CM | POA: Diagnosis not present

## 2021-11-03 DIAGNOSIS — M25572 Pain in left ankle and joints of left foot: Secondary | ICD-10-CM | POA: Diagnosis not present

## 2021-11-03 DIAGNOSIS — S86312A Strain of muscle(s) and tendon(s) of peroneal muscle group at lower leg level, left leg, initial encounter: Secondary | ICD-10-CM | POA: Diagnosis not present

## 2021-11-05 NOTE — Progress Notes (Signed)
Ankle MRI does show a long partial tear of the peroneal tendon on the side of the ankle where you hurt.  This is a situation where surgery would be considered.  Would you like me to refer you to an orthopedic surgeon to discuss options?  Additionally you could return to clinic and we can talk about treatment plan and options as well as MRI in full detail.

## 2021-11-06 DIAGNOSIS — E039 Hypothyroidism, unspecified: Secondary | ICD-10-CM | POA: Diagnosis not present

## 2021-11-06 DIAGNOSIS — R7309 Other abnormal glucose: Secondary | ICD-10-CM | POA: Diagnosis not present

## 2021-11-06 DIAGNOSIS — R5381 Other malaise: Secondary | ICD-10-CM | POA: Diagnosis not present

## 2021-11-06 DIAGNOSIS — E291 Testicular hypofunction: Secondary | ICD-10-CM | POA: Diagnosis not present

## 2021-11-06 DIAGNOSIS — K589 Irritable bowel syndrome without diarrhea: Secondary | ICD-10-CM | POA: Diagnosis not present

## 2021-11-06 DIAGNOSIS — E782 Mixed hyperlipidemia: Secondary | ICD-10-CM | POA: Diagnosis not present

## 2021-11-12 ENCOUNTER — Ambulatory Visit (INDEPENDENT_AMBULATORY_CARE_PROVIDER_SITE_OTHER): Payer: BC Managed Care – PPO | Admitting: Family Medicine

## 2021-11-12 ENCOUNTER — Encounter: Payer: Self-pay | Admitting: Family Medicine

## 2021-11-12 VITALS — BP 130/80 | HR 71 | Ht 75.0 in | Wt 246.2 lb

## 2021-11-12 DIAGNOSIS — S86312A Strain of muscle(s) and tendon(s) of peroneal muscle group at lower leg level, left leg, initial encounter: Secondary | ICD-10-CM | POA: Diagnosis not present

## 2021-11-12 NOTE — Patient Instructions (Addendum)
Good to see you today. ° °Follow-up as needed. °

## 2021-11-12 NOTE — Progress Notes (Signed)
I, Phillip Hester, LAT, ATC, am serving as scribe for Dr. Lynne Hester.  Phillip Hester is a 66 y.o. male who presents to [place] at [facility] today for f/u chronic L lateral ankle/midfoot pain and L ankle MRI review. Prior L foot MRI from 05/27/21 showed a probably a stress fracture of the cuboid, bone edema of the other midfoot bones, and tendinitis of the peroneal tendons. On 08/31/21 he was given a L peroneal tendon steroid injection. Pt was last seen by Dr. Georgina Snell on 11/03/21 and was prescribed prednisone, advised to wear a CAM walker boot, and proceed to MRI. Today, pt reports that his L ankle and foot pain remains about the same.  He reports some days are better than others depending on his level of activity.  Dx imaging: 11/03/21 L ankle MRI 08/31/21 L ankle XR             05/27/21 L foot MRI             04/06/21 L foot XR  Pertinent review of systems: No fevers or chills  Relevant historical information: History of pulmonary embolism.  History of bladder cancer.   Exam:  BP 130/80 (BP Location: Right Arm, Patient Position: Sitting, Cuff Size: Large)   Pulse 71   Ht '6\' 3"'$  (1.905 m)   Wt 246 lb 3.2 oz (111.7 kg)   SpO2 98%   BMI 30.77 kg/m  General: Well Developed, well nourished, and in no acute distress.   MSK: Left ankle: Some swelling present laterally. Mildly tender palpation lateral ankle. Normal ankle motion.    Lab and Radiology Results  EXAM: MRI OF THE LEFT ANKLE WITHOUT CONTRAST   TECHNIQUE: Multiplanar, multisequence MR imaging of the ankle was performed. No intravenous contrast was administered.   COMPARISON:  Left ankle radiographs 09/08/2021, left foot radiographs 04/06/2021; MRI left forefoot 05/27/2021   FINDINGS: TENDONS   Peroneal: The peroneus brevis tendon is intact. There is linear fluid bright signal indicating a longitudinal split tear of the peroneus longus tendon starting just distal to the fibula and involving an  approximate 4 cm length of the tendon down to the plantar lateral aspect of the anterior process of the calcaneus (axial series 3 images 24 through 35). Portions of this tear extend through both the anterior and posterior walls incompletely split the tendon longitudinally.   Posteromedial: Mild posterior tibial and flexor digitorum longus tenosynovitis. The flexor hallucis longus tendon is intact.   Anterior: The tibialis anterior, extensor hallucis longus, and extensor digitorum longus tendons are intact.   Achilles: Intact.   Plantar Fascia: Tiny plantar calcaneal heel spur. No plantar fasciitis.   LIGAMENTS   Lateral: The anterior and posterior talofibular, anterior and posterior tibiofibular, and calcaneofibular ligaments are intact.   Medial: The tibiotalar deep deltoid and tibial spring ligaments are intact.   CARTILAGE   Ankle Joint: High-grade thinning of the anterolateral tibial plafond cartilage in a region measuring up to 9 mm in transverse dimension (coronal image 26) and 13 mm in AP dimension (sagittal image 9).   Subtalar Joints/Sinus Tarsi: Fat is preserved within sinus tarsi.   Bones: Mild dorsal talonavicular osteophytosis. Interval healing of the prior cuboid fracture seen on 05/27/2021 MRI and 04/06/2021 radiographs. Mild-to-moderate tarsometatarsal joint space narrowing with degenerative subchondral marrow edema greatest within the proximal medial aspect of fourth metatarsal and adjacent distal lateral aspect of the lateral cuneiform, base of second metatarsal, and medial base of the first metatarsal.  The distal 6 cm of the tibia proximal to the distal tibial plafond is imaged on the current study. No definite bone abnormality is seen in this region, including the area of relative lucency seen on 08/31/2021 radiographs.   Other: The tarsal tunnel is unremarkable. The Lisfranc ligament complex is intact. Resolution of the edema within the soft  tissues just plantar to the navicular.   IMPRESSION: Compared to 04/06/2021:   1. Interval healing of prior cuboid fracture. Resolution of the soft tissue edema plantar to the navicular. 2. There is an extensive longitudinal split tear of the peroneus longus tendon starting just distal to the fibula and involving a 4 cm length of the tendon. 3. High-grade thinning of the anterolateral tibial plafond cartilage. 4. No bone abnormality is seen to correspond to the area of relative lucency seen on 08/31/2021 radiographs overlying the distal tibial diaphysis.     Electronically Signed   By: Yvonne Kendall M.D.   On: 11/04/2021 12:04   I, Phillip Hester, personally (independently) visualized and performed the interpretation of the images attached in this note.  Assessment and Plan: 66 y.o. male with ankle pain predominantly thought to be due to partial tear of the peroneal tendon lateral ankle.  Some of the pain is also thought to be due to the degenerative changes seen on MRI as well as resolving cuboid fracture.  Recommend follow-up with his podiatrist to talk surgical options.  He has had a pretty good trial of conservative management and I think at this point is reasonable to consider surgery.  I certainly could inject this again but I do not think that is a great long-term solution for this problem.  Car show that he wants to go to in August in [place] that I could do an injection before that if needed.  Recheck back as needed.   Discussed warning signs or symptoms. Please see discharge instructions. Patient expresses understanding.   The above documentation has been reviewed and is accurate and complete Phillip Hester, M.D.   Total encounter time 20 minutes including face-to-face time with the patient and, reviewing past medical record, and charting on the date of service.   Treatment plan and options.

## 2021-11-18 DIAGNOSIS — J069 Acute upper respiratory infection, unspecified: Secondary | ICD-10-CM | POA: Diagnosis not present

## 2021-11-18 DIAGNOSIS — R5383 Other fatigue: Secondary | ICD-10-CM | POA: Diagnosis not present

## 2021-11-18 DIAGNOSIS — N329 Bladder disorder, unspecified: Secondary | ICD-10-CM | POA: Diagnosis not present

## 2021-11-30 ENCOUNTER — Ambulatory Visit (INDEPENDENT_AMBULATORY_CARE_PROVIDER_SITE_OTHER): Payer: BC Managed Care – PPO | Admitting: Podiatry

## 2021-11-30 DIAGNOSIS — M7672 Peroneal tendinitis, left leg: Secondary | ICD-10-CM

## 2021-12-02 NOTE — Progress Notes (Signed)
HPI: 66 y.o. male presenting today for follow-up evaluation of left foot pain.  Patient was last seen in the office on 08/19/2021.  Patient states that he continues to have pain and tenderness to the lateral aspect of the ankle.  His sports medicine physician actually ordered an updated MRI.  He presents to review the MRI results and discuss further treatment options including possible surgical consult.  Past Medical History:  Diagnosis Date   Arthritis    knees   Cancer ([place])    bladder cancer   GERD (gastroesophageal reflux disease)    Heart murmur    diagnosed with "athlete's murmur" at 17   History of kidney stones    Hyperlipidemia    Pure hypercholesterolemia    Sleep apnea    CPAP at night    Past Surgical History:  Procedure Laterality Date   INGUINAL HERNIA REPAIR     IRRIGATION AND DEBRIDEMENT SEBACEOUS CYST     KNEE SURGERY Left 2013   SHOULDER SURGERY Right 2013   TOTAL HIP ARTHROPLASTY Right 11/20/2019   Procedure: RIGHT TOTAL HIP ARTHROPLASTY ANTERIOR APPROACH;  Surgeon: Mcarthur Rossetti, MD;  Location: [place] [facility];  Service: Orthopedics;  Laterality: Right;   WISDOM TOOTH EXTRACTION      Allergies  Allergen Reactions   Bee Venom Anaphylaxis   Prednisone     REACTION: "drove me nutty"     Physical Exam: General: The patient is alert and oriented x3 in no acute distress.  Dermatology: Skin is warm, dry and supple bilateral lower extremities. Negative for open lesions or macerations.  Vascular: Palpable pedal pulses bilaterally. Capillary refill within normal limits.  Negative for any significant edema or erythema  Neurological: Light touch and protective threshold grossly intact  Musculoskeletal Exam: Pain on palpation along the peroneal tendon left foot.  Palpation of the cuboid does not elicit any pain or symptoms.  Patient also does not have any pain or symptoms throughout the remaining portion of the foot or ankle  MRI LT foot WO contrast 05/27/2021:   IMPRESSION: 1. Nondisplaced fracture of the cuboid bone. 2. Mild focal bone marrow edema along the plantar aspect of the navicular without a discernible fracture line. Findings may represent a stress reaction. 3. The plantar cuneonavicular ligament is heterogeneous and edematous, at least partially torn. 4. Mild tendinosis and tenosynovitis of the distal peroneus longus tendon.  MRI LT FOOT WO CONTRAST 11/03/2021: Peroneal: The peroneus brevis tendon is intact. There is linear fluid bright signal indicating a longitudinal split tear of the peroneus longus tendon starting just distal to the fibula and involving an approximate 4 cm length of the tendon down to the plantar lateral aspect of the anterior process of the calcaneus (axial series 3 images 24 through 35). Portions of this tear extend through both the anterior and posterior walls incompletely split the tendon longitudinally.  IMPRESSION: Compared to 04/06/2021: 1. Interval healing of prior cuboid fracture. Resolution of the soft tissue edema plantar to the navicular. 2. There is an extensive longitudinal split tear of the peroneus longus tendon starting just distal to the fibula and involving a 4 cm length of the tendon. 3. High-grade thinning of the anterolateral tibial plafond cartilage. 4. No bone abnormality is seen to correspond to the area of relative lucency seen on 08/31/2021 radiographs overlying the distal tibial diaphysis.   Assessment: 1.  Nondisplaced fracture cuboid LT; healed 2.  Longitudinal split tear peroneus longus tendon LT   Plan of Care:  1.  Patient evaluated.   2.  Unfortunately the patient has had pain and tenderness to this area of the foot or ankle for several months.  He has tried multiple conservative modalities including cam boot immobilization, anti-inflammatories steroid injections, oral anti-inflammatories, shoe gear modifications, and rest and ice.  I do believe surgery is warranted at  this time.  Risk benefits advantages and disadvantages of this specific procedure were explained in detail.  Possible complications discussed.  The patient opts for surgical intervention at this time.  All patient questions answered.  No guarantees were expressed or implied. 3.  Authorization for surgery was initiated today.  Surgery will consist of primary repair of peroneus longus tendon left with application of amniotic graft 4.  Return to clinic 1 week postop  *Goes by Leretha Dykes, DPM [facility]  Dr. Edrick Kins, DPM    [address], [place] [address]                Office [phone]  Fax [phone]

## 2021-12-29 DIAGNOSIS — R5381 Other malaise: Secondary | ICD-10-CM | POA: Diagnosis not present

## 2021-12-29 DIAGNOSIS — J9801 Acute bronchospasm: Secondary | ICD-10-CM | POA: Diagnosis not present

## 2021-12-29 DIAGNOSIS — J069 Acute upper respiratory infection, unspecified: Secondary | ICD-10-CM | POA: Diagnosis not present

## 2021-12-29 DIAGNOSIS — R051 Acute cough: Secondary | ICD-10-CM | POA: Diagnosis not present

## 2022-01-12 DIAGNOSIS — L239 Allergic contact dermatitis, unspecified cause: Secondary | ICD-10-CM | POA: Diagnosis not present

## 2022-01-13 ENCOUNTER — Ambulatory Visit (INDEPENDENT_AMBULATORY_CARE_PROVIDER_SITE_OTHER): Payer: BC Managed Care – PPO | Admitting: Podiatry

## 2022-01-13 DIAGNOSIS — M7672 Peroneal tendinitis, left leg: Secondary | ICD-10-CM

## 2022-01-13 DIAGNOSIS — M7752 Other enthesopathy of left foot: Secondary | ICD-10-CM

## 2022-01-13 MED ORDER — BETAMETHASONE SOD PHOS & ACET 6 (3-3) MG/ML IJ SUSP
3.0000 mg | Freq: Once | INTRAMUSCULAR | Status: AC
  Administered 2022-01-13: 3 mg via INTRA_ARTICULAR

## 2022-01-13 NOTE — Progress Notes (Signed)
HPI: 66 y.o. male presenting today for follow-up evaluation of left foot pain.  Patient states that he is going to a car show in the next week and he will be doing a significant amount of walking.  He has noticed an increase in pain and tenderness to the lateral aspect of the left foot and ankle.  He is requesting injection today.  He states that he would like to wait until surgery when he gets approved for Medicare.  He presents for further treatment and evaluation  Past Medical History:  Diagnosis Date   Arthritis    knees   Cancer ([place])    bladder cancer   GERD (gastroesophageal reflux disease)    Heart murmur    diagnosed with "athlete's murmur" at 17   History of kidney stones    Hyperlipidemia    Pure hypercholesterolemia    Sleep apnea    CPAP at night    Past Surgical History:  Procedure Laterality Date   INGUINAL HERNIA REPAIR     IRRIGATION AND DEBRIDEMENT SEBACEOUS CYST     KNEE SURGERY Left 2013   SHOULDER SURGERY Right 2013   TOTAL HIP ARTHROPLASTY Right 11/20/2019   Procedure: RIGHT TOTAL HIP ARTHROPLASTY ANTERIOR APPROACH;  Surgeon: Mcarthur Rossetti, MD;  Location: [place];  Service: Orthopedics;  Laterality: Right;   WISDOM TOOTH EXTRACTION      Allergies  Allergen Reactions   Bee Venom Anaphylaxis   Prednisone     REACTION: "drove me nutty"     Physical Exam: General: The patient is alert and oriented x3 in no acute distress.  Dermatology: Skin is warm, dry and supple bilateral lower extremities. Negative for open lesions or macerations.  Vascular: Palpable pedal pulses bilaterally. Capillary refill within normal limits.  Negative for any significant edema or erythema  Neurological: Light touch and protective threshold grossly intact  Musculoskeletal Exam: Pain on palpation along the peroneal tendon left foot as well as along the lateral aspect of the left ankle joint..  Palpation of the cuboid does not elicit any pain or symptoms.   MRI LT foot  WO contrast 05/27/2021:  IMPRESSION: 1. Nondisplaced fracture of the cuboid bone. 2. Mild focal bone marrow edema along the plantar aspect of the navicular without a discernible fracture line. Findings may represent a stress reaction. 3. The plantar cuneonavicular ligament is heterogeneous and edematous, at least partially torn. 4. Mild tendinosis and tenosynovitis of the distal peroneus longus tendon.  MRI LT FOOT WO CONTRAST 11/03/2021: Peroneal: The peroneus brevis tendon is intact. There is linear fluid bright signal indicating a longitudinal split tear of the peroneus longus tendon starting just distal to the fibula and involving an approximate 4 cm length of the tendon down to the plantar lateral aspect of the anterior process of the calcaneus (axial series 3 images 24 through 35). Portions of this tear extend through both the anterior and posterior walls incompletely split the tendon longitudinally.  IMPRESSION: Compared to 04/06/2021: 1. Interval healing of prior cuboid fracture. Resolution of the soft tissue edema plantar to the navicular. 2. There is an extensive longitudinal split tear of the peroneus longus tendon starting just distal to the fibula and involving a 4 cm length of the tendon. 3. High-grade thinning of the anterolateral tibial plafond cartilage. 4. No bone abnormality is seen to correspond to the area of relative lucency seen on 08/31/2021 radiographs overlying the distal tibial diaphysis.   Assessment: 1.  Nondisplaced fracture cuboid LT; healed 2.  Longitudinal split tear peroneus longus tendon LT 3.  Capsulitis left ankle   Plan of Care:  1. Patient evaluated.   2.  Injection of 0.5 cc Celestone Soluspan injected into the lateral aspect of the left ankle joint as well as along the peroneal tendon sheath left.  This will provide temporary relief for the patient to keep his symptoms minimal until surgery 3.  Authorization for surgery was initiated  last visit on 11/30/2021..  Surgery will consist of primary repair of peroneus longus tendon left with application of amniotic graft.  Patient states that he would like to wait until he qualifies for Medicare to have the surgery.  I do not see any urgent rush for surgery in the time being. 4.  Return to clinic 1 week postop  *Goes by [employer].  Going to a car show this weekend for [employer]     Edrick Kins, DPM [facility]  Dr. Edrick Kins, DPM    [address], [place] [address]                Office [phone]  Fax [phone]

## 2022-02-08 DIAGNOSIS — D509 Iron deficiency anemia, unspecified: Secondary | ICD-10-CM | POA: Diagnosis not present

## 2022-02-08 DIAGNOSIS — E291 Testicular hypofunction: Secondary | ICD-10-CM | POA: Diagnosis not present

## 2022-02-12 DIAGNOSIS — N521 Erectile dysfunction due to diseases classified elsewhere: Secondary | ICD-10-CM | POA: Diagnosis not present

## 2022-02-12 DIAGNOSIS — E291 Testicular hypofunction: Secondary | ICD-10-CM | POA: Diagnosis not present

## 2022-03-01 ENCOUNTER — Ambulatory Visit (INDEPENDENT_AMBULATORY_CARE_PROVIDER_SITE_OTHER): Payer: BC Managed Care – PPO

## 2022-03-01 ENCOUNTER — Ambulatory Visit (INDEPENDENT_AMBULATORY_CARE_PROVIDER_SITE_OTHER): Payer: BC Managed Care – PPO | Admitting: Orthopaedic Surgery

## 2022-03-01 DIAGNOSIS — Z96641 Presence of right artificial hip joint: Secondary | ICD-10-CM

## 2022-03-01 NOTE — Progress Notes (Signed)
Phillip Hester comes in today at just over 2 years status post a right total hip arthroplasty.  He is still having some pain in his groin on the right side.  He has been evaluated for a hernia by my brother and this was negative.  He walks without assistive device.  On exam he has good flexion and extension as well as rotation of his right operative hip.  When I have him lay in a supine position hyperflexion of the hip does cause some pain in the groin.  An AP pelvis and lateral the right hip shows no gross features that are worrisome of the hip replacement itself.  The implants themselves look to be in good position.  I would like to send him to my partner Dr. Elba Barman for an ultrasound assessment of the right hip looking for any type of fluid collection and I would like Dr. Rolena Infante to consider a one-time injection in the right hip iliopsoas tendon and even around the right hip joint itself.  I would then like to see the patient back after that and about 2 to 3 weeks.  If his symptoms resolve they will be great.  If he continues to have symptoms my next step would be a three-phase bone scan to rule out prosthetic loosening and even potentially MRI of the right hip.  The patient agrees with this treatment plan.

## 2022-03-02 ENCOUNTER — Ambulatory Visit: Payer: Self-pay

## 2022-03-02 ENCOUNTER — Encounter: Payer: Self-pay | Admitting: Sports Medicine

## 2022-03-02 ENCOUNTER — Ambulatory Visit (INDEPENDENT_AMBULATORY_CARE_PROVIDER_SITE_OTHER): Payer: BC Managed Care – PPO | Admitting: Sports Medicine

## 2022-03-02 VITALS — Ht 75.0 in | Wt 235.0 lb

## 2022-03-02 DIAGNOSIS — Z96641 Presence of right artificial hip joint: Secondary | ICD-10-CM

## 2022-03-02 DIAGNOSIS — M25551 Pain in right hip: Secondary | ICD-10-CM

## 2022-03-02 DIAGNOSIS — M7071 Other bursitis of hip, right hip: Secondary | ICD-10-CM | POA: Diagnosis not present

## 2022-03-02 NOTE — Progress Notes (Signed)
Right sided groin pain. Hx of right hip replacement 2 years ago. Pain in the groin has been since surgery. Tylenol for pain PRN X-rays in system from appointment with [facility] yesterday(03/01/22)

## 2022-03-02 NOTE — Progress Notes (Signed)
Phillip Hester - 66 y.o. male MRN [id number]  Date of birth: [birth year]  Office Visit Note: Visit Date: 03/02/2022 PCP: Patient, No Pcp Per Referred by: No ref. provider found  Subjective: Chief Complaint  Patient presents with   Right Hip - Pain   HPI: Phillip Hester is a pleasant 66 y.o. male who presents today for evaluation of right hip pain status post THA.  Patient was sent by my partner, Dr. Ninfa Linden.  He did perform a right hip replacement about 2 years ago and ever since the hip replacement he has had some continued groin pain on the right side.  He was evaluated by him and a general surgeon for hernia and this was found to be negative.  X-rays about the hip show the implants to be in good position.  Elta Guadeloupe tells me today that the pain is more of a dull ache, but can be worse with prolonged walking or certain activity.  He denies any redness or swelling, no fever or chills.  Pertinent ROS were reviewed with the patient and found to be negative unless otherwise specified above in HPI.   Assessment & Plan: Visit Diagnoses:  1. Pain in right hip   2. History of right hip replacement   3. Iliopsoas bursitis of right hip    Plan: We performed an ultrasound of the right hip for [facility], discussed these results with him which did not show any evidence of significant joint effusion or hardware abnormality.  He did have evidence of a thickened iliopsoas tendon with very mild hypoechoic fluid surrounding, likely indicative of iliopsoas bursopathy. I could not reproduce any impingement or catching of the tendon through dynamic motion on exam or ultrasound evaluation today.  Through shared decision making, elected to proceed with ultrasound-guided iliopsoas tendon sheath injection for both diagnostic and hopefully therapeutic purposes.  Following the injection, Mark did state that where we did the injection felt exactly like where his pain is normally located.  I would like him to follow-up  with Dr. Ninfa Linden in 2-3 weeks and if he has good improvement in his pain, would recommend directed physical therapy for the hip flexors and iliopsoas tendon.  We will leave further treatment discretion to Dr. Ninfa Linden.  I am happy to see him back as needed.  Follow-up: Return for With Dr. Ninfa Linden in 2-3 weeks for reevaluation of the right hip.   Meds & Orders: No orders of the defined types were placed in this encounter.   Orders Placed This Encounter  Procedures   [place] Extrem Low Right Ltd   US Guided Needle Placement - No Linked Charges     Procedures: Procedure: US-guided hip injection, right iliopsoas tendon sheath After discussion on risks/benefits/indications and informed verbal consent was obtained, a timeout was performed. Patient was lying supine on exam table. The hip was cleaned with provodione and alcohol swabs. Then utilizing ultrasound guidance, the patient's iliopsoas tendon and sheath was visualized lying overtop of the femoral head and neck. Using an in-plane approach the tendon sheath was subsequently injected with 4:1 lidocaine:betamethasone with ultrasound visualization of the injectate administered around the tendon sheath. Patient tolerated procedure well without immediate complications.   [place] image of right psoas, SAX view.       Clinical History: No specialty comments available.  He reports that he has never smoked. He has never used smokeless tobacco. No results for input(s): "HGBA1C", "LABURIC" in the last 8760 hours.  Objective:   Vital Signs: Ht '6\' 3"'$  (1.905  m)   Wt 235 lb (106.6 kg)   BMI 29.37 kg/m   Physical Exam  Gen: Well-appearing, in no acute distress; non-toxic CV: Regular Rate. Well-perfused. Warm.  Resp: Breathing unlabored on room air; no wheezing. Psych: Fluid speech in conversation; appropriate affect; normal thought process Neuro: Sensation intact throughout. No gross coordination deficits.   Ortho Exam -Right hip: Evaluation of the  right hip demonstrates a well-healed vertical incision that is clean dry and intact.  Patient has good hip flexion, extension as well as passive internal and external logroll.  I cannot reproduce an impingement like syndrome for him.  Leg lengths equivocal.  Neurovascular intact distally.  Imaging: US Guided Needle Placement - No Linked Charges  Result Date: 03/02/2022 Technically successful ultrasound-guided iliopsoas tendon sheath injection.  [place] Extrem Low Right Ltd  Result Date: 03/02/2022 [facility] Limited hip ultrasound performed, right:  Evaluation of the right hip joint shows femoral head and neck junction with associated hip arthroplasty.  There is no significant joint effusion noted.  Femoral shaft was identified without cortical irregularity.  Evaluation of the ASIS and AIIS with subsequent sartorius and rectus femoris tendons were identified without significant tendinopathy or evidence of gross tearing.  Iliopsoas tendon sheath was evaluated in both short and long axis, there is a mild degree of thickening of the tendon with trace hypoechoic fluid surrounding the tendon.  Scanning the psoas tendon dynamically I cannot reproduce any type of impingement or clicking of the psoas tendon.   Iliopsoas bursopathy. Technically successful ultrasound-guided iliopsoas tendon sheath injection.    Past Medical/Family/Surgical/Social History: Medications & Allergies reviewed per EMR, new medications updated. Patient Active Problem List   Diagnosis Date Noted   Osteoarthritis of left glenohumeral joint 06/04/2020   Pulmonary embolism ([place]) 11/27/2019   OSA on CPAP    Status post total replacement of right hip 11/20/2019   Unilateral primary osteoarthritis, right hip 07/18/2019   Right inguinal hernia 08/29/2018   Fatigue 08/13/2018   Hyperestrogenism 10/20/2017   Low testosterone in male 10/20/2017   Vitamin B12 deficiency (non anemic) 10/20/2017   Encounter for screening colonoscopy 05/20/2017    Family history of malignant neoplasm of prostate 05/30/2015   Malignant neoplasm of lateral wall of urinary bladder ([place]) 05/30/2015   Elevated prostate specific antigen (PSA) 03/24/2015   Depression 08/03/2012   Low libido 05/03/2012   ED (erectile dysfunction) of organic origin 11/03/2011   Recurrent nephrolithiasis 11/03/2011   Renal cyst 11/03/2011   Cardiovascular risk factor 12/13/2010   Hyperlipidemia 10/18/2008   OBSTRUCTIVE SLEEP APNEA 10/18/2008   SNORING 10/18/2008   Past Medical History:  Diagnosis Date   Arthritis    knees   Cancer ([place])    bladder cancer   GERD (gastroesophageal reflux disease)    Heart murmur    diagnosed with "athlete's murmur" at 17   History of kidney stones    Hyperlipidemia    Pure hypercholesterolemia    Sleep apnea    CPAP at night   Family History  Problem Relation Age of Onset   Arthritis Mother    Dementia Father    Cancer Maternal Grandfather    Cancer Paternal Grandfather    Past Surgical History:  Procedure Laterality Date   INGUINAL HERNIA REPAIR     IRRIGATION AND DEBRIDEMENT SEBACEOUS CYST     KNEE SURGERY Left 2013   SHOULDER SURGERY Right 2013   TOTAL HIP ARTHROPLASTY Right 11/20/2019   Procedure: RIGHT TOTAL HIP ARTHROPLASTY ANTERIOR APPROACH;  Surgeon:  Mcarthur Rossetti, MD;  Location: [place];  Service: Orthopedics;  Laterality: Right;   WISDOM TOOTH EXTRACTION     Social History   Occupational History   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[employer]  Tobacco Use   Smoking status: Never   Smokeless tobacco: Never  Vaping Use   Vaping Use: Never used  Substance and Sexual Activity   Alcohol use: Yes    Alcohol/week: 2.0 - 3.0 standard drinks of alcohol    Types: 2 - 3 Standard drinks or equivalent per week    Comment: 2-3 drinks weekly   Drug use: No   Sexual activity: Yes

## 2022-03-10 DIAGNOSIS — J9801 Acute bronchospasm: Secondary | ICD-10-CM | POA: Diagnosis not present

## 2022-03-10 DIAGNOSIS — J069 Acute upper respiratory infection, unspecified: Secondary | ICD-10-CM | POA: Diagnosis not present

## 2022-03-19 ENCOUNTER — Ambulatory Visit (HOSPITAL_COMMUNITY)
Admission: RE | Admit: 2022-03-19 | Discharge: 2022-03-19 | Disposition: A | Discharge status: Home / Self Care | Payer: BC Managed Care – PPO | Source: Ambulatory Visit | Attending: Surgery | Admitting: Surgery

## 2022-03-19 ENCOUNTER — Telehealth: Payer: Self-pay | Admitting: Hematology and Oncology

## 2022-03-19 ENCOUNTER — Ambulatory Visit (INDEPENDENT_AMBULATORY_CARE_PROVIDER_SITE_OTHER): Payer: BC Managed Care – PPO | Admitting: Surgery

## 2022-03-19 ENCOUNTER — Telehealth: Payer: Self-pay | Admitting: Radiology

## 2022-03-19 ENCOUNTER — Telehealth: Payer: Self-pay | Admitting: Surgery

## 2022-03-19 ENCOUNTER — Encounter: Payer: Self-pay | Admitting: Surgery

## 2022-03-19 VITALS — BP 142/89 | HR 67

## 2022-03-19 DIAGNOSIS — M7989 Other specified soft tissue disorders: Secondary | ICD-10-CM

## 2022-03-19 DIAGNOSIS — I82411 Acute embolism and thrombosis of right femoral vein: Secondary | ICD-10-CM | POA: Diagnosis not present

## 2022-03-19 DIAGNOSIS — M79661 Pain in right lower leg: Secondary | ICD-10-CM | POA: Diagnosis not present

## 2022-03-19 MED ORDER — RIVAROXABAN (XARELTO) VTE STARTER PACK (15 & 20 MG): ORAL_TABLET | ORAL | 0 refills | Status: DC

## 2022-03-19 NOTE — Addendum Note (Signed)
Addended by: Meyer Cory on: 03/19/2022 01:14 PM   Modules accepted: Orders

## 2022-03-19 NOTE — Addendum Note (Signed)
Addended by: Lanae Crumbly on: 03/19/2022 12:30 PM   Modules accepted: Orders

## 2022-03-19 NOTE — Telephone Encounter (Signed)
Per Benjiman Core, PA-C, Dr. Ninfa Linden requests referral be placed to Hematology for consult for patient. Referral entered as urgent and Sabrina advised.

## 2022-03-19 NOTE — Progress Notes (Addendum)
Office Visit Note   Patient: Phillip Hester           Date of Birth: Apr 26, 1956           MRN: [id number] Visit Date: 03/19/2022              Requested by: No referring provider defined for this encounter. PCP: Patient, No Pcp Per   Assessment & Plan: Visit Diagnoses:  1. Swelling of right lower extremity   2. Right calf pain   History of postop DVT/pulmonary embolism after previous right total hip replacement  Plan: With patient's acute right lower extremity calf pain and swelling with previous history of postop DVT and pulmonary embolism I will get stat venous Doppler to rule out DVT.  Patient will go straight over to the vascular lab and I will await callback report from them.  I will give patient further instructions at that time.  Stressed to him the importance of going straight over to the vascular lab.  Advised Dr. Ninfa Linden of patient's visit today.  Follow-Up Instructions: No follow-ups on file.   Orders:  Orders Placed This Encounter  Procedures   VAS [place] LOWER EXTREMITY VENOUS (DVT)   No orders of the defined types were placed in this encounter.     Procedures: No procedures performed   Clinical Data: No additional findings.   Subjective: Chief Complaint  Patient presents with   Right Leg - Pain    HPI 66 year old male is being seen by me today for acute sudden onset right calf pain and swelling.  Patient is established with Dr. Ninfa Linden and is status post right total hip replacement by him November 19, 621 which was complicated by a postop DVT and pulmonary embolism.  Patient was recently sent to Dr. Rolena Infante for ultrasound-guided right iliopsoas tendon sheath injection.  States that he did not really have any improvement with the injection but then started having right calf pain and swelling yesterday.  No injury.  He denies chest pain or shortness of breath.  States that his calf feels tight.  With previous DVT/PE was on Eliquis for several months.  Patient's  previous postop right total hip replacement CT chest November 27, 2019 showed:   EXAM: CT ANGIOGRAPHY CHEST WITH CONTRAST   TECHNIQUE: Multidetector CT imaging of the chest was performed using the standard protocol during bolus administration of intravenous contrast. Multiplanar CT image reconstructions and MIPs were obtained to evaluate the vascular anatomy.   CONTRAST:  28m ISOVUE-370 IOPAMIDOL (ISOVUE-370) INJECTION 76%   COMPARISON:  11/26/2019   FINDINGS: Cardiovascular: This is a technically adequate evaluation of the pulmonary vasculature. Pulmonary emboli are seen within the posterior and lateral segmental branches of the right lower lobe pulmonary artery. Minimal clot burden. No evidence of right heart strain.   The heart is unremarkable without pericardial effusion. Thoracic aorta is normal in caliber with no aneurysm or dissection.   Mediastinum/Nodes: No enlarged mediastinal, hilar, or axillary lymph nodes. Thyroid gland, trachea, and esophagus demonstrate no significant findings.   Lungs/Pleura: There are patchy areas of consolidation within the right lower lobe. Consolidation in the lateral basilar segment could reflect pulmonary infarct. Consolidation in the medial basilar segment likely reflects atelectasis. There is a trace right pleural effusion.   Minimal hypoventilatory changes are seen within the left lower lobe. No pneumothorax. Central airways are patent.   Upper Abdomen: Renal cysts are incidentally noted. Remainder of the upper abdomen is unremarkable.   Musculoskeletal: No acute or destructive bony  lesions. Reconstructed images demonstrate no additional findings.   Review of the MIP images confirms the above findings.   IMPRESSION: 1. Right lower lobe segmental pulmonary emboli. No evidence of right heart strain. Minimal clot burden. 2. Patchy consolidation at the lung bases. Consolidation within the lateral lobe basilar likely reflects  infarct segment right lower. Other areas likely reflect atelectasis. 3. Trace right pleural effusion.   These results were called by telephone at the time of interpretation on 11/27/2019 at 3:23 pm to provider Lindell Spar, PA, who verbally acknowledged these results.     Electronically Signed   By: Randa Ngo M.D.   On: 11/27/2019 15:30  Patient states that he is supposed to travel out of town tomorrow.   Objective: Vital Signs: BP (!) 142/89   Pulse 67   Physical Exam HENT:     Head: Normocephalic and atraumatic.     Nose: Nose normal.  Pulmonary:     Comments: No respiratory distress. Musculoskeletal:     Comments: Exam right hip no pain with internal/external rotation.  Right knee good range of motion.  Knee nontender.  He has moderate tenderness of the right calf.  Marked swelling compared to the left leg.  2-3+ pretibial edema on the right.  Neurological:     Mental Status: He is alert.  Psychiatric:        Mood and Affect: Mood normal.     Ortho Exam  Specialty Comments:  No specialty comments available.  Imaging: No results found.   PMFS History: Patient Active Problem List   Diagnosis Date Noted   Osteoarthritis of left glenohumeral joint 06/04/2020   Pulmonary embolism ([place]) 11/27/2019   OSA on CPAP    Status post total replacement of right hip 11/20/2019   Unilateral primary osteoarthritis, right hip 07/18/2019   Right inguinal hernia 08/29/2018   Fatigue 08/13/2018   Hyperestrogenism 10/20/2017   Low testosterone in male 10/20/2017   Vitamin B12 deficiency (non anemic) 10/20/2017   Encounter for screening colonoscopy 05/20/2017   Family history of malignant neoplasm of prostate 05/30/2015   Malignant neoplasm of lateral wall of urinary bladder ([place]) 05/30/2015   Elevated prostate specific antigen (PSA) 03/24/2015   Depression 08/03/2012   Low libido 05/03/2012   ED (erectile dysfunction) of organic origin 11/03/2011   Recurrent  nephrolithiasis 11/03/2011   Renal cyst 11/03/2011   Cardiovascular risk factor 12/13/2010   Hyperlipidemia 10/18/2008   OBSTRUCTIVE SLEEP APNEA 10/18/2008   SNORING 10/18/2008   Past Medical History:  Diagnosis Date   Arthritis    knees   Cancer ([place])    bladder cancer   GERD (gastroesophageal reflux disease)    Heart murmur    diagnosed with "athlete's murmur" at 17   History of kidney stones    Hyperlipidemia    Pure hypercholesterolemia    Sleep apnea    CPAP at night    Family History  Problem Relation Age of Onset   Arthritis Mother    Dementia Father    Cancer Maternal Grandfather    Cancer Paternal Grandfather     Past Surgical History:  Procedure Laterality Date   INGUINAL HERNIA REPAIR     IRRIGATION AND DEBRIDEMENT SEBACEOUS CYST     KNEE SURGERY Left 2013   SHOULDER SURGERY Right 2013   TOTAL HIP ARTHROPLASTY Right 11/20/2019   Procedure: RIGHT TOTAL HIP ARTHROPLASTY ANTERIOR APPROACH;  Surgeon: Mcarthur Rossetti, MD;  Location: [facility];  Service: Orthopedics;  Laterality: Right;  WISDOM TOOTH EXTRACTION     Social History   Occupational History   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[employer]  Tobacco Use   Smoking status: Never   Smokeless tobacco: Never  Vaping Use   Vaping Use: Never used  Substance and Sexual Activity   Alcohol use: Yes    Alcohol/week: 2.0 - 3.0 standard drinks of alcohol    Types: 2 - 3 Standard drinks or equivalent per week    Comment: 2-3 drinks weekly   Drug use: No   Sexual activity: Yes     ADDENDUM  I received callback report from vascular lab.  Right lower extremity venous Doppler showed:  Summary:  RIGHT:  - Findings consistent with acute deep vein thrombosis involving the right  common femoral vein, SF junction, right femoral vein, right popliteal  vein, and right posterior tibial veins.  - No cystic structure found in the popliteal fossa.   VASCULAR TECH ADVISED THAT CLOT IS "MOBILE".      LEFT:  - No evidence of common femoral vein obstruction.      I did speak with Dr. Ninfa Linden by phone who was in the OR to advise him of the finding.  He recommended calling in a Xarelto starter pack x3 weeks and schedule outpatient appointment with vascular surgery.  With patient's previous history of PE and with the extensive findings on venous Doppler today I asked Dr Ninfa Linden if patient should go to the emergency room for Evaluation for possible admit with Lovenox bridge to Xarelto and he did not think this was indicated.  Patient was advised.  Patient understands that he should go get the Xarelto immediately and start.  I will see if we can possibly get him into vascular surgery this afternoon.  Advised patient that he absolutely should not travel out of town as he was planning.  Told patient that if he has any increased pain/swelling in the right leg or begins to have any symptoms of cough, chest pain, chest tightness, shortness of breath that he needs to go IMMEDIATELY to the emergency room.  He voices understanding.    Addendum March 19, 2022 1:56 PM   Our scheduler was able to get patient an appointment with vascular surgery Monday, March 22, 2022.  Patient again understands that if there are any concerns that he needs to go immediately to the emergency room.  Dr. Ninfa Linden reached back out to me and again stated that patient needs vascular surgery referral along with hematology.  Hematology referral was also placed.   Addendum  March 19, 2022 3:32PM  Just spoke with our scheduler Gabriel Cirri and she advised me that patient declined the appointment that was scheduled for Monday and accepted one for March 24, 2022.  Vascular office advised our scheduler that the reason he is not able to keep appointment Monday because he is going out of town.  As I previously documented I advised patient that I do not recommend that he does any traveling.  Patient was made aware previously of the risk  of DVT progressing to PE that he has had in the past.  I did advise Dr. Ninfa Linden and asked if he or his PA Artis Delay can get in contact with the patient to give their thoughts.

## 2022-03-19 NOTE — Telephone Encounter (Signed)
No [facility] around here has rx. It has been sent to [facility] on [address]. Patient will call back if he is not able to get it there.

## 2022-03-19 NOTE — Telephone Encounter (Signed)
Scheduled appt per 10/6 referral. Pt is aware of appt date and time. Pt is aware to arrive 15 mins prior to appt time and to bring and updated insurance card. Pt is aware of appt location.   

## 2022-03-19 NOTE — Telephone Encounter (Signed)
Pt called stating PA Ricard Dillon [facility] off [address] and [address]. Pt phone number is [phone]

## 2022-03-22 ENCOUNTER — Other Ambulatory Visit: Payer: Self-pay | Admitting: *Deleted

## 2022-03-22 ENCOUNTER — Encounter: Payer: Self-pay | Admitting: *Deleted

## 2022-03-22 ENCOUNTER — Encounter: Payer: Self-pay | Admitting: Surgery

## 2022-03-22 ENCOUNTER — Ambulatory Visit (INDEPENDENT_AMBULATORY_CARE_PROVIDER_SITE_OTHER): Payer: BC Managed Care – PPO | Admitting: Surgery

## 2022-03-22 VITALS — BP 143/88 | HR 80 | Temp 98.3°F | Resp 20 | Ht 75.0 in | Wt 248.9 lb

## 2022-03-22 DIAGNOSIS — I82411 Acute embolism and thrombosis of right femoral vein: Secondary | ICD-10-CM

## 2022-03-22 NOTE — H&P (View-Only) (Signed)
Vascular and Vein Specialist of [facility]  [name] MRN: [id number] DOB: November 15, 1955 Sex: male   REQUESTING PROVIDER:    Dr. Ricard Dillon   REASON FOR CONSULT:    DVT  HISTORY OF PRESENT ILLNESS:   Phillip Hester is a 66 y.o. male, who is furred for evaluation of an acute right leg DVT.  The patient stated he began having difficulty last week with right calf swelling and pain up in his groin.  He did recently have an iliopsoas injection.  He has a history of a DVT and PE following hip replacement surgery.  Prior to this event, he had completed his dose of Eliquis.  Patient has a history of bladder cancer.  He is a non-smoker.  PAST MEDICAL HISTORY    Past Medical History:  Diagnosis Date   Arthritis    knees   Cancer ([place])    bladder cancer   DVT (deep venous thrombosis) (HCC)    GERD (gastroesophageal reflux disease)    Heart murmur    diagnosed with "athlete's murmur" at 17   History of kidney stones    Hyperlipidemia    Pure hypercholesterolemia    Sleep apnea    CPAP at night     FAMILY HISTORY   Family History  Problem Relation Age of Onset   Arthritis Mother    Dementia Father    Cancer Maternal Grandfather    Cancer Paternal Grandfather     SOCIAL HISTORY:   Social History   Socioeconomic History   Marital status: Legally Separated    Spouse name: Not on file   Number of children: 2   Years of education: Not on file   Highest education level: Not on file  Occupational History   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[employer]  Tobacco Use   Smoking status: Never   Smokeless tobacco: Never  Vaping Use   Vaping Use: Never used  Substance and Sexual Activity   Alcohol use: Yes    Alcohol/week: 2.0 - 3.0 standard drinks of alcohol    Types: 2 - 3 Standard drinks or equivalent per week    Comment: 2-3 drinks weekly   Drug use: No   Sexual activity: Yes  Other Topics Concern    Not on file  Social History Narrative   Not on file   Social Determinants of Health   Financial Resource Strain: Not on file  Food Insecurity: Not on file  Transportation Needs: Not on file  Physical Activity: Not on file  Stress: Not on file  Social Connections: Not on file  Intimate Partner Violence: Not on file    ALLERGIES:    Allergies  Allergen Reactions   Bee Venom Anaphylaxis   Prednisone     REACTION: "drove me nutty"    CURRENT MEDICATIONS:    Current Outpatient Medications  Medication Sig Dispense Refill   Ascorbic Acid (VITAMIN C) 1000 MG tablet Take 3,000 mg by mouth daily.      aspirin 81 MG chewable tablet aspirin 81 mg chewable tablet     b complex vitamins tablet Take 2-3 tablets by mouth daily.     buPROPion (WELLBUTRIN XL) 300 MG 24 hr tablet      Cholecalciferol (VITAMIN D3) 25 MCG (1000 UT) CAPS Take 3,000 Units by mouth daily.     Cyanocobalamin (VITAMIN B-12) 5000 MCG TBDP Take 5,000-10,000 mg by mouth daily.     Melatonin 10 MG TABS Take 10-20 mg by  mouth at bedtime.     Multiple Vitamin (MULTIVITAMIN) tablet Take 1 tablet by mouth daily. Multi Genic without Iron     Nutritional Supplements (DHEA PO) Take 75 mg by mouth daily.     omeprazole (PRILOSEC) 20 MG capsule Take 20 mg by mouth daily as needed (reflux).      ondansetron (ZOFRAN ODT) 4 MG disintegrating tablet Take 1 tablet (4 mg total) by mouth every 8 (eight) hours as needed for nausea or vomiting. 20 tablet 0   OVER THE COUNTER MEDICATION Apply 1 application topically daily as needed (Pain). DMSO cream     RIVAROXABAN (XARELTO) VTE STARTER PACK (15 & 20 MG) Follow package directions: Take one '15mg'$  tablet by mouth twice a day. On day 22, switch to one '20mg'$  tablet once a day. Take with food. 51 each 0   zinc gluconate 50 MG tablet Take 100 mg by mouth daily.     Current Facility-Administered Medications  Medication Dose Route Frequency Provider Last Rate Last Admin   betamethasone  acetate-betamethasone sodium phosphate (CELESTONE) injection 3 mg  3 mg Intra-articular Once Edrick Kins, DPM        REVIEW OF SYSTEMS:   '[X]'$  denotes positive finding, '[ ]'$  denotes negative finding Cardiac  Comments:  Chest pain or chest pressure:    Shortness of breath upon exertion:    Short of breath when lying flat:    Irregular heart rhythm:        Vascular    Pain in calf, thigh, or hip brought on by ambulation:    Pain in feet at night that wakes you up from your sleep:     Blood clot in your veins:    Leg swelling:  x       Pulmonary    Oxygen at home:    Productive cough:     Wheezing:         Neurologic    Sudden weakness in arms or legs:     Sudden numbness in arms or legs:     Sudden onset of difficulty speaking or slurred speech:    Temporary loss of vision in one eye:     Problems with dizziness:         Gastrointestinal    Blood in stool:      Vomited blood:         Genitourinary    Burning when urinating:     Blood in urine:        Psychiatric    Major depression:         Hematologic    Bleeding problems:    Problems with blood clotting too easily:        Skin    Rashes or ulcers:        Constitutional    Fever or chills:     PHYSICAL EXAM:   Vitals:   03/22/22 1033  BP: (!) 143/88  Pulse: 80  Resp: 20  Temp: 98.3 F (36.8 C)  SpO2: 95%  Weight: 248 lb 14.4 oz (112.9 kg)  Height: '6\' 3"'$  (1.905 m)    GENERAL: The patient is a well-nourished male, in no acute distress. The vital signs are documented above. CARDIAC: There is a regular rate and rhythm.  VASCULAR: Right calf fullness and tenderness with edema to the right leg PULMONARY: Nonlabored respirations MUSCULOSKELETAL: There are no major deformities or cyanosis. NEUROLOGIC: No focal weakness or paresthesias are detected. SKIN: There are no ulcers or rashes  noted. PSYCHIATRIC: The patient has a normal affect.  STUDIES:   I have reviewed the following: RIGHT:  - Findings  consistent with acute deep vein thrombosis involving the right  common femoral vein, SF junction, right femoral vein, right popliteal  vein, and right posterior tibial veins.  - No cystic structure found in the popliteal fossa.     LEFT:  - No evidence of common femoral vein obstruction.   ASSESSMENT and PLAN   Right iliofemoral DVT: The patient is still having considerable discomfort in his right calf.  For that reason, I have recommended mechanical thrombectomy of the right leg via a popliteal approach.  I discussed the details of the procedure with the patient he wishes to proceed.  He will continue on his anticoagulation.  We will do this tomorrow.  All questions answered.  He is scheduled to see hematology on Wednesday   Annamarie Major, IV, MD, FACS [facility] Specialists of [facility] [phone] Pager [phone]

## 2022-03-22 NOTE — Progress Notes (Signed)
Vascular and Vein Specialist of [facility]  [name] MRN: [id number] DOB: 11-Apr-1956 Sex: male   REQUESTING PROVIDER:    Dr. Ricard Dillon   REASON FOR CONSULT:    DVT  HISTORY OF PRESENT ILLNESS:   Phillip Hester is a 66 y.o. male, who is furred for evaluation of an acute right leg DVT.  The patient stated he began having difficulty last week with right calf swelling and pain up in his groin.  He did recently have an iliopsoas injection.  He has a history of a DVT and PE following hip replacement surgery.  Prior to this event, he had completed his dose of Eliquis.  Patient has a history of bladder cancer.  He is a non-smoker.  PAST MEDICAL HISTORY    Past Medical History:  Diagnosis Date   Arthritis    knees   Cancer ([place])    bladder cancer   DVT (deep venous thrombosis) (HCC)    GERD (gastroesophageal reflux disease)    Heart murmur    diagnosed with "athlete's murmur" at 17   History of kidney stones    Hyperlipidemia    Pure hypercholesterolemia    Sleep apnea    CPAP at night     FAMILY HISTORY   Family History  Problem Relation Age of Onset   Arthritis Mother    Dementia Father    Cancer Maternal Grandfather    Cancer Paternal Grandfather     SOCIAL HISTORY:   Social History   Socioeconomic History   Marital status: Legally Separated    Spouse name: Not on file   Number of children: 2   Years of education: Not on file   Highest education level: Not on file  Occupational History   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[employer]  Tobacco Use   Smoking status: Never   Smokeless tobacco: Never  Vaping Use   Vaping Use: Never used  Substance and Sexual Activity   Alcohol use: Yes    Alcohol/week: 2.0 - 3.0 standard drinks of alcohol    Types: 2 - 3 Standard drinks or equivalent per week    Comment: 2-3 drinks weekly   Drug use: No   Sexual activity: Yes  Other Topics Concern    Not on file  Social History Narrative   Not on file   Social Determinants of Health   Financial Resource Strain: Not on file  Food Insecurity: Not on file  Transportation Needs: Not on file  Physical Activity: Not on file  Stress: Not on file  Social Connections: Not on file  Intimate Partner Violence: Not on file    ALLERGIES:    Allergies  Allergen Reactions   Bee Venom Anaphylaxis   Prednisone     REACTION: "drove me nutty"    CURRENT MEDICATIONS:    Current Outpatient Medications  Medication Sig Dispense Refill   Ascorbic Acid (VITAMIN C) 1000 MG tablet Take 3,000 mg by mouth daily.      aspirin 81 MG chewable tablet aspirin 81 mg chewable tablet     b complex vitamins tablet Take 2-3 tablets by mouth daily.     buPROPion (WELLBUTRIN XL) 300 MG 24 hr tablet      Cholecalciferol (VITAMIN D3) 25 MCG (1000 UT) CAPS Take 3,000 Units by mouth daily.     Cyanocobalamin (VITAMIN B-12) 5000 MCG TBDP Take 5,000-10,000 mg by mouth daily.     Melatonin 10 MG TABS Take 10-20 mg by  mouth at bedtime.     Multiple Vitamin (MULTIVITAMIN) tablet Take 1 tablet by mouth daily. Multi Genic without Iron     Nutritional Supplements (DHEA PO) Take 75 mg by mouth daily.     omeprazole (PRILOSEC) 20 MG capsule Take 20 mg by mouth daily as needed (reflux).      ondansetron (ZOFRAN ODT) 4 MG disintegrating tablet Take 1 tablet (4 mg total) by mouth every 8 (eight) hours as needed for nausea or vomiting. 20 tablet 0   OVER THE COUNTER MEDICATION Apply 1 application topically daily as needed (Pain). DMSO cream     RIVAROXABAN (XARELTO) VTE STARTER PACK (15 & 20 MG) Follow package directions: Take one '15mg'$  tablet by mouth twice a day. On day 22, switch to one '20mg'$  tablet once a day. Take with food. 51 each 0   zinc gluconate 50 MG tablet Take 100 mg by mouth daily.     Current Facility-Administered Medications  Medication Dose Route Frequency Provider Last Rate Last Admin   betamethasone  acetate-betamethasone sodium phosphate (CELESTONE) injection 3 mg  3 mg Intra-articular Once Edrick Kins, DPM        REVIEW OF SYSTEMS:   '[X]'$  denotes positive finding, '[ ]'$  denotes negative finding Cardiac  Comments:  Chest pain or chest pressure:    Shortness of breath upon exertion:    Short of breath when lying flat:    Irregular heart rhythm:        Vascular    Pain in calf, thigh, or hip brought on by ambulation:    Pain in feet at night that wakes you up from your sleep:     Blood clot in your veins:    Leg swelling:  x       Pulmonary    Oxygen at home:    Productive cough:     Wheezing:         Neurologic    Sudden weakness in arms or legs:     Sudden numbness in arms or legs:     Sudden onset of difficulty speaking or slurred speech:    Temporary loss of vision in one eye:     Problems with dizziness:         Gastrointestinal    Blood in stool:      Vomited blood:         Genitourinary    Burning when urinating:     Blood in urine:        Psychiatric    Major depression:         Hematologic    Bleeding problems:    Problems with blood clotting too easily:        Skin    Rashes or ulcers:        Constitutional    Fever or chills:     PHYSICAL EXAM:   Vitals:   03/22/22 1033  BP: (!) 143/88  Pulse: 80  Resp: 20  Temp: 98.3 F (36.8 C)  SpO2: 95%  Weight: 248 lb 14.4 oz (112.9 kg)  Height: '6\' 3"'$  (1.905 m)    GENERAL: The patient is a well-nourished male, in no acute distress. The vital signs are documented above. CARDIAC: There is a regular rate and rhythm.  VASCULAR: Right calf fullness and tenderness with edema to the right leg PULMONARY: Nonlabored respirations MUSCULOSKELETAL: There are no major deformities or cyanosis. NEUROLOGIC: No focal weakness or paresthesias are detected. SKIN: There are no ulcers or rashes  noted. PSYCHIATRIC: The patient has a normal affect.  STUDIES:   I have reviewed the following: RIGHT:  - Findings  consistent with acute deep vein thrombosis involving the right  common femoral vein, SF junction, right femoral vein, right popliteal  vein, and right posterior tibial veins.  - No cystic structure found in the popliteal fossa.     LEFT:  - No evidence of common femoral vein obstruction.   ASSESSMENT and PLAN   Right iliofemoral DVT: The patient is still having considerable discomfort in his right calf.  For that reason, I have recommended mechanical thrombectomy of the right leg via a popliteal approach.  I discussed the details of the procedure with the patient he wishes to proceed.  He will continue on his anticoagulation.  We will do this tomorrow.  All questions answered.  He is scheduled to see hematology on Wednesday   Annamarie Major, IV, MD, FACS Vascular and Vein Specialists of [facility] [phone] Pager [phone]

## 2022-03-23 ENCOUNTER — Ambulatory Visit (INDEPENDENT_AMBULATORY_CARE_PROVIDER_SITE_OTHER): Payer: BC Managed Care – PPO | Admitting: Orthopaedic Surgery

## 2022-03-23 ENCOUNTER — Encounter (HOSPITAL_COMMUNITY)
Admission: RE | Disposition: A | Discharge status: Home / Self Care | Payer: Self-pay | Source: Home / Self Care | Attending: Surgery

## 2022-03-23 ENCOUNTER — Ambulatory Visit (HOSPITAL_COMMUNITY)
Admission: RE | Admit: 2022-03-23 | Discharge: 2022-03-23 | Disposition: A | Discharge status: Home / Self Care | Payer: BC Managed Care – PPO | Attending: Surgery | Admitting: Surgery

## 2022-03-23 DIAGNOSIS — I82411 Acute embolism and thrombosis of right femoral vein: Secondary | ICD-10-CM | POA: Insufficient documentation

## 2022-03-23 DIAGNOSIS — Z8551 Personal history of malignant neoplasm of bladder: Secondary | ICD-10-CM | POA: Diagnosis not present

## 2022-03-23 DIAGNOSIS — Z7901 Long term (current) use of anticoagulants: Secondary | ICD-10-CM | POA: Insufficient documentation

## 2022-03-23 DIAGNOSIS — I82401 Acute embolism and thrombosis of unspecified deep veins of right lower extremity: Secondary | ICD-10-CM | POA: Diagnosis not present

## 2022-03-23 DIAGNOSIS — Z96641 Presence of right artificial hip joint: Secondary | ICD-10-CM

## 2022-03-23 HISTORY — PX: PERIPHERAL VASCULAR THROMBECTOMY: CATH118306

## 2022-03-23 HISTORY — PX: PERIPHERAL VASCULAR BALLOON ANGIOPLASTY: CATH118281

## 2022-03-23 LAB — POCT I-STAT, CHEM 8
BUN: 19 mg/dL (ref 8–23)
Calcium, Ion: 1.06 mmol/L — ABNORMAL LOW (ref 1.15–1.40)
Chloride: 107 mmol/L (ref 98–111)
Creatinine, Ser: 1 mg/dL (ref 0.61–1.24)
Glucose, Bld: 80 mg/dL (ref 70–99)
HCT: 53 % — ABNORMAL HIGH (ref 39.0–52.0)
Hemoglobin: 18 g/dL — ABNORMAL HIGH (ref 13.0–17.0)
Potassium: 3.8 mmol/L (ref 3.5–5.1)
Sodium: 141 mmol/L (ref 135–145)
TCO2: 25 mmol/L (ref 22–32)

## 2022-03-23 SURGERY — PERIPHERAL VASCULAR THROMBECTOMY
Anesthesia: LOCAL | Laterality: Right

## 2022-03-23 MED ORDER — LIDOCAINE HCL (PF) 1 % IJ SOLN
INTRAMUSCULAR | Status: AC
  Filled 2022-03-23: qty 30

## 2022-03-23 MED ORDER — LIDOCAINE HCL (PF) 1 % IJ SOLN
INTRAMUSCULAR | Status: DC | PRN
  Administered 2022-03-23: 20 mL via INTRADERMAL

## 2022-03-23 MED ORDER — MIDAZOLAM HCL 2 MG/2ML IJ SOLN
INTRAMUSCULAR | Status: AC
  Filled 2022-03-23: qty 2

## 2022-03-23 MED ORDER — SODIUM CHLORIDE 0.9 % IV SOLN: INTRAVENOUS | Status: DC

## 2022-03-23 MED ORDER — MIDAZOLAM HCL 2 MG/2ML IJ SOLN
INTRAMUSCULAR | Status: DC | PRN
  Administered 2022-03-23: 1 mg via INTRAVENOUS
  Administered 2022-03-23: 2 mg via INTRAVENOUS

## 2022-03-23 MED ORDER — HEPARIN (PORCINE) IN NACL 1000-0.9 UT/500ML-% IV SOLN
INTRAVENOUS | Status: AC
  Filled 2022-03-23: qty 500

## 2022-03-23 MED ORDER — HEPARIN SODIUM (PORCINE) 1000 UNIT/ML IJ SOLN
INTRAMUSCULAR | Status: DC | PRN
  Administered 2022-03-23: 5000 [IU] via INTRAVENOUS

## 2022-03-23 MED ORDER — HEPARIN (PORCINE) IN NACL 1000-0.9 UT/500ML-% IV SOLN
INTRAVENOUS | Status: DC | PRN
  Administered 2022-03-23: 500 mL

## 2022-03-23 MED ORDER — IODIXANOL 320 MG/ML IV SOLN
INTRAVENOUS | Status: DC | PRN
  Administered 2022-03-23: 40 mL via INTRAVENOUS

## 2022-03-23 MED ORDER — HEPARIN SODIUM (PORCINE) 1000 UNIT/ML IJ SOLN
INTRAMUSCULAR | Status: AC
  Filled 2022-03-23: qty 10

## 2022-03-23 MED ORDER — FENTANYL CITRATE (PF) 100 MCG/2ML IJ SOLN
INTRAMUSCULAR | Status: DC | PRN
  Administered 2022-03-23: 50 ug via INTRAVENOUS
  Administered 2022-03-23: 25 ug via INTRAVENOUS

## 2022-03-23 MED ORDER — FENTANYL CITRATE (PF) 100 MCG/2ML IJ SOLN
INTRAMUSCULAR | Status: AC
  Filled 2022-03-23: qty 2

## 2022-03-23 SURGICAL SUPPLY — 18 items
BAG SNAP BAND KOVER 36X36 (MISCELLANEOUS) IMPLANT
BALLN MUSTANG 10X80X75 (BALLOONS) ×2
BALLN MUSTANG 12X80X75 (BALLOONS) ×2
BALLOON MUSTANG 10X80X75 (BALLOONS) IMPLANT
BALLOON MUSTANG 12X80X75 (BALLOONS) IMPLANT
CATH CLOT TRIEVER BOLD (CATHETERS) IMPLANT
CATH VISIONS PV .035 IVUS (CATHETERS) IMPLANT
COVER DOME SNAP 22 D (MISCELLANEOUS) IMPLANT
GLIDEWIRE ADV .035X260CM (WIRE) IMPLANT
KIT ENCORE 26 ADVANTAGE (KITS) IMPLANT
KIT MICROPUNCTURE NIT STIFF (SHEATH) IMPLANT
KIT PV (KITS) IMPLANT
PROTECTION STATION PRESSURIZED (MISCELLANEOUS) ×2
SHEATH CLOT RETRIEVER (SHEATH) IMPLANT
SHEATH PINNACLE 8F 10CM (SHEATH) IMPLANT
SHEATH PROBE COVER 6X72 (BAG) IMPLANT
STATION PROTECTION PRESSURIZED (MISCELLANEOUS) IMPLANT
WIRE TORQFLEX AUST .018X40CM (WIRE) IMPLANT

## 2022-03-23 NOTE — Progress Notes (Signed)
Phillip Hester comes in today and did let me know that he is having a vascular procedure on his right lower extremity today by Dr. Trula Slade due to an extensive right DVT.  I have replaced his right hip several years ago and had seen him recently with pain in the right hip at the extreme of flexion I felt this was likely irritation of the iliopsoas tendon.  I sent him for an ultrasound-guided injection of the iliopsoas tendon on 03/02/2022.  He then presented to the office and saw one of the physician assistants on Friday with significant calf pain and swelling.  He was noted to have an extensive DVT by vascular ultrasound imaging studies.  Of note he did have a DVT of the PE after his original surgery and was on Eliquis and then this apparently resolved.  I will see him later in follow-up at some time for his right hip once he has recovered from his vascular procedure.

## 2022-03-23 NOTE — Interval H&P Note (Signed)
History and Physical Interval Note:  03/23/2022 1:33 PM  Phillip Hester  has presented today for surgery, with the diagnosis of claudication - dvt.  The various methods of treatment have been discussed with the patient and family. After consideration of risks, benefits and other options for treatment, the patient has consented to  Procedure(s): PERIPHERAL VASCULAR THROMBECTOMY (N/A) as a surgical intervention.  The patient's history has been reviewed, patient examined, no change in status, stable for surgery.  I have reviewed the patient's chart and labs.  Questions were answered to the patient's satisfaction.     Annamarie Major

## 2022-03-23 NOTE — Op Note (Signed)
[name] MRN: [id number] DOB: [birth year] Sex: male  03/23/2022 Pre-operative Diagnosis: Right leg DVT Post-operative diagnosis:  Same Surgeon:  Annamarie Major Procedure Performed:  1.  Ultrasound-guided access, right popliteal vein  2.  Intravascular ultrasound (IVUS): Inferior vena cava, right common iliac, external iliac, common femoral, femoral, and popliteal vein  3.  Mechanical thrombectomy: Right external iliac, common femoral, femoral, and popliteal vein  4.  Balloon venoplasty, right common iliac, external iliac, common femoral, femoral, and popliteal vein  5.  Right leg and inferior venacavogram  6.  Conscious sedation, 46 minutes     Indications: This is a 66 year old gentleman with acute onset of recurrent right leg DVT last week.  He remains very symptomatic and comes in today for thrombectomy.  Procedure:  The patient was identified in the holding area and taken to room 8.  The patient was then placed supine on the table and prepped and draped in the usual sterile fashion.  A time out was called.  Conscious sedation was administered with the use of IV fentanyl and Versed under continuous physician and nurse monitoring.  Heart rate, blood pressure, and oxygen saturation were continuously monitored.  Total sedation time was 46 minutes.  Ultrasound was used to evaluate the right right popliteal vein which was full of thrombus and noncompressible.  Under ultrasound guidance, the right popliteal vein was cannulated with a micropuncture needle.  A 018 wire was inserted without resistance followed by placement of the micropuncture sheath.  A small contrast puff was performed to confirm venous cannulation.  Next a Glidewire advantage was inserted into the internal jugular vein.  An 8 French sheath was placed.  IVUS was used to evaluate the right popliteal, femoral, common femoral, external iliac, and common iliac veins as well as inferior vena cava.  There was thrombus  up to the mid external iliac vein.  Next, the Inari sheath was inserted after the 8 French sheath was removed.  Next, the Inari thrombectomy device was inserted.  4 passes were made rotating the device with each pass.  Mechanical thrombectomy was performed of the right external iliac, common femoral, femoral, and popliteal vein.  Significant acute and subacute thrombus was evacuated.  I then evaluated the veins with IVUS and it appeared that we had evacuated nearly all of the thrombus.  I then performed balloon venoplasty of the external iliac, common femoral, femoral, and popliteal vein.  With a 10 x 80 Mustang balloon.  A contrast injection was performed which showed excellent flow through the popliteal and femoral veins as well as the external iliac, common iliac, and inferior vena cava.  There was luminal narrowing in the external iliac and common iliac veins and so I inserted a 12 x 80 Mustang balloon and repeated balloon venoplasty of the common iliac and external iliac vein.  Repeat imaging showed no hang up of contrast within the veins although there was still some luminal narrowing within the iliac system however this was not flow-limiting.  I did not want to place a stent for this as there did not appear to be external compression.  I was satisfied with these results.  The Inari sheath was removed and a 200U stitch was placed with a silicone bumper to remove the sheath.  Sterile compression dressings were applied  Impression:  #1  Successful venous mechanical thrombectomy of the right external iliac, common femoral, femoral, and popliteal veins    V. Annamarie Major, M.D., FACS [facility] Office: [phone] Pager:  [phone]

## 2022-03-23 NOTE — Discharge Instructions (Addendum)
Take Xarelto tonight  F/u at [facility] after heme-onc appointment tomorrow

## 2022-03-24 ENCOUNTER — Inpatient Hospital Stay: Payer: BC Managed Care – PPO | Attending: Hematology and Oncology | Admitting: Hematology and Oncology

## 2022-03-24 ENCOUNTER — Ambulatory Visit (INDEPENDENT_AMBULATORY_CARE_PROVIDER_SITE_OTHER): Payer: BC Managed Care – PPO | Admitting: Physician Assistant

## 2022-03-24 ENCOUNTER — Encounter: Payer: BC Managed Care – PPO | Admitting: Vascular Surgery

## 2022-03-24 ENCOUNTER — Other Ambulatory Visit: Payer: Self-pay

## 2022-03-24 ENCOUNTER — Ambulatory Visit: Payer: BC Managed Care – PPO

## 2022-03-24 ENCOUNTER — Encounter (HOSPITAL_COMMUNITY): Payer: Self-pay | Admitting: Surgery

## 2022-03-24 ENCOUNTER — Inpatient Hospital Stay: Payer: BC Managed Care – PPO

## 2022-03-24 VITALS — BP 164/93 | HR 84 | Temp 98.1°F | Resp 14 | Ht 75.0 in | Wt 249.2 lb

## 2022-03-24 VITALS — BP 131/77 | HR 82 | Temp 98.1°F | Resp 16 | Ht 75.0 in | Wt 248.0 lb

## 2022-03-24 DIAGNOSIS — R760 Raised antibody titer: Secondary | ICD-10-CM | POA: Insufficient documentation

## 2022-03-24 DIAGNOSIS — Z7901 Long term (current) use of anticoagulants: Secondary | ICD-10-CM | POA: Diagnosis not present

## 2022-03-24 DIAGNOSIS — Z96641 Presence of right artificial hip joint: Secondary | ICD-10-CM | POA: Insufficient documentation

## 2022-03-24 DIAGNOSIS — I82401 Acute embolism and thrombosis of unspecified deep veins of right lower extremity: Secondary | ICD-10-CM | POA: Diagnosis not present

## 2022-03-24 DIAGNOSIS — I82411 Acute embolism and thrombosis of right femoral vein: Secondary | ICD-10-CM

## 2022-03-24 DIAGNOSIS — Z8551 Personal history of malignant neoplasm of bladder: Secondary | ICD-10-CM | POA: Diagnosis not present

## 2022-03-24 DIAGNOSIS — Z79890 Hormone replacement therapy (postmenopausal): Secondary | ICD-10-CM | POA: Insufficient documentation

## 2022-03-24 DIAGNOSIS — M7989 Other specified soft tissue disorders: Secondary | ICD-10-CM | POA: Diagnosis not present

## 2022-03-24 DIAGNOSIS — Z809 Family history of malignant neoplasm, unspecified: Secondary | ICD-10-CM | POA: Insufficient documentation

## 2022-03-24 DIAGNOSIS — Z86711 Personal history of pulmonary embolism: Secondary | ICD-10-CM | POA: Insufficient documentation

## 2022-03-24 LAB — ANTITHROMBIN III: AntiThromb III Func: 114 % (ref 75–120)

## 2022-03-24 NOTE — Progress Notes (Signed)
Office Note     CC:  follow up Requesting Provider:  Karle Starch  HPI: Phillip Hester is a 66 y.o. (11/07/1955) male who presents status post mechanical thrombectomy of the right leg due to extensive DVT involving the right external iliac and common femoral vein.  He believes the edema of his right leg has improved drastically compared to before surgery.  He does still have some tenderness in his calf.  He was not provided with compression socks in the hospital yesterday.  He is on Xarelto daily.   Past Medical History:  Diagnosis Date   Arthritis    knees   Cancer ([place])    bladder cancer   DVT (deep venous thrombosis) ([facility])    GERD (gastroesophageal reflux disease)    Heart murmur    diagnosed with "athlete's murmur" at 17   History of kidney stones    Hyperlipidemia    Pure hypercholesterolemia    Sleep apnea    CPAP at night    Past Surgical History:  Procedure Laterality Date   INGUINAL HERNIA REPAIR     IRRIGATION AND DEBRIDEMENT SEBACEOUS CYST     KNEE SURGERY Left 2013   PERIPHERAL VASCULAR BALLOON ANGIOPLASTY Right 03/23/2022   Procedure: PERIPHERAL VASCULAR BALLOON ANGIOPLASTY;  Surgeon: Serafina Mitchell, MD;  Location: [facility];  Service: Cardiovascular;  Laterality: Right;   PERIPHERAL VASCULAR THROMBECTOMY N/A 03/23/2022   Procedure: PERIPHERAL VASCULAR THROMBECTOMY;  Surgeon: Serafina Mitchell, MD;  Location: [facility];  Service: Cardiovascular;  Laterality: N/A;   SHOULDER SURGERY Right 2013   TOTAL HIP ARTHROPLASTY Right 11/20/2019   Procedure: RIGHT TOTAL HIP ARTHROPLASTY ANTERIOR APPROACH;  Surgeon: Mcarthur Rossetti, MD;  Location: [facility];  Service: Orthopedics;  Laterality: Right;   WISDOM TOOTH EXTRACTION      Social History   Socioeconomic History   Marital status: Legally Separated    Spouse name: Not on file   Number of children: 2   Years of education: Not on file   Highest education level: Not on file   Occupational History   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[employer]  Tobacco Use   Smoking status: Never   Smokeless tobacco: Never  Vaping Use   Vaping Use: Never used  Substance and Sexual Activity   Alcohol use: Yes    Alcohol/week: 2.0 - 3.0 standard drinks of alcohol    Types: 2 - 3 Standard drinks or equivalent per week    Comment: 2-3 drinks weekly   Drug use: No   Sexual activity: Yes  Other Topics Concern   Not on file  Social History Narrative   Not on file   Social Determinants of Health   Financial Resource Strain: Not on file  Food Insecurity: Not on file  Transportation Needs: Not on file  Physical Activity: Not on file  Stress: Not on file  Social Connections: Not on file  Intimate Partner Violence: Not on file    Family History  Problem Relation Age of Onset   Arthritis Mother    Dementia Father    Cancer Maternal Grandfather    Cancer Paternal Grandfather     Current Outpatient Medications  Medication Sig Dispense Refill   albuterol (VENTOLIN HFA) 108 (90 Base) MCG/ACT inhaler Inhale 2 puffs into the lungs every 4 (four) hours as needed.     amoxicillin (AMOXIL) 500 MG tablet Take 250-500 mg by mouth daily as needed.     Ascorbic  Acid (VITAMIN C) 1000 MG tablet Take 3,000 mg by mouth daily.      Cholecalciferol (VITAMIN D3) 50 MCG (2000 UT) capsule Take 6,000 Units by mouth daily.     Omega-3-6-9 CAPS Take 1 capsule by mouth daily. 1300 mg     RIVAROXABAN (XARELTO) VTE STARTER PACK (15 & 20 MG) Follow package directions: Take one '15mg'$  tablet by mouth twice a day. On day 22, switch to one '20mg'$  tablet once a day. Take with food. (Patient taking differently: Take 15 mg by mouth 2 (two) times daily.) 51 each 0   testosterone cypionate (DEPOTESTOTERONE CYPIONATE) 100 MG/ML injection Inject into the muscle once a week. For IM use only     traZODone (DESYREL) 100 MG tablet Take 100 mg by mouth at bedtime as needed for sleep.     zinc  gluconate 50 MG tablet Take 100 mg by mouth daily.     Current Facility-Administered Medications  Medication Dose Route Frequency Provider Last Rate Last Admin   betamethasone acetate-betamethasone sodium phosphate (CELESTONE) injection 3 mg  3 mg Intra-articular Once Edrick Kins, DPM        Allergies  Allergen Reactions   Bee Venom Anaphylaxis     REVIEW OF SYSTEMS:   '[X]'$  denotes positive finding, '[ ]'$  denotes negative finding Cardiac  Comments:  Chest pain or chest pressure:    Shortness of breath upon exertion:    Short of breath when lying flat:    Irregular heart rhythm:        Vascular    Pain in calf, thigh, or hip brought on by ambulation:    Pain in feet at night that wakes you up from your sleep:     Blood clot in your veins:    Leg swelling:         Pulmonary    Oxygen at home:    Productive cough:     Wheezing:         Neurologic    Sudden weakness in arms or legs:     Sudden numbness in arms or legs:     Sudden onset of difficulty speaking or slurred speech:    Temporary loss of vision in one eye:     Problems with dizziness:         Gastrointestinal    Blood in stool:     Vomited blood:         Genitourinary    Burning when urinating:     Blood in urine:        Psychiatric    Major depression:         Hematologic    Bleeding problems:    Problems with blood clotting too easily:        Skin    Rashes or ulcers:        Constitutional    Fever or chills:      PHYSICAL EXAMINATION:  Vitals:   03/24/22 1128  BP: 131/77  Pulse: 82  Resp: 16  Temp: 98.1 F (36.7 C)  TempSrc: Temporal  SpO2: 94%  Weight: 248 lb (112.5 kg)  Height: '6\' 3"'$  (1.905 m)    General:  WDWN in NAD; vital signs documented above Gait: Not observed HENT: WNL, normocephalic Pulmonary: normal non-labored breathing , without Rales, rhonchi,  wheezing Cardiac: regular HR Abdomen: soft, NT, no masses Skin: without rashes Vascular Exam/Pulses: Palpable right DP  pulse Extremities: Right popliteal catheterization site without hematoma or continued bleeding after suture  removal Musculoskeletal: no muscle wasting or atrophy  Neurologic: A&O X 3;  No focal weakness or paresthesias are detected Psychiatric:  The pt has Normal affect    ASSESSMENT/PLAN:: 66 y.o. male postoperative day #1 after mechanical thrombectomy of the right external iliac, common femoral, and femoral vein due to recurrent DVT  -Compressive dressing removed in order to remove the suture at the popliteal vein catheterization site.  No bleeding or hematoma at the catheterization site. -Patient was then measured for and provided compression 20 to 30 mmHg thigh-high to be worn regularly for the next month -We will check an IVC, right iliac, right leg venous duplex in 4 to 6 weeks -Continue Xarelto; course of treatment will be determined by his hematologist -Also recommended Tylenol for pain   Dagoberto Ligas, PA-C [facility] [phone]  Clinic MD:   Donzetta Matters

## 2022-03-24 NOTE — Progress Notes (Signed)
[place] NOTE  Patient Care Team: Karle Starch, MD as PCP - General (Family Medicine)  CHIEF COMPLAINTS/PURPOSE OF CONSULTATION:  Right lower extremity DVT  ASSESSMENT & PLAN:   This is a 66 year old male patient with past medical history significant for dyslipidemia, obstructive sleep apnea, history of PE after right hip replacement about 2 years ago now diagnosed with right lower extremity DVT status post thrombectomy yesterday on Xarelto referred to hematology for anticoagulation recommendations.  On his first episode of PE appears to be provoked after hip replacement, had 3 months of anticoagulation approximately.  With regards to this episode of DVT, no provoking factors except recent testosterone injections.  We have once again discussed that testosterone can increase the risk of DVT/PE however it is not clearly known if this is all that may have caused it.  Either way if he can slowly discontinue the testosterone supplementation, we have encouraged this.  He also does not feel tremendously different on the testosterone supplementation, may have felt a little perked up or more energy.  He is willing to talk to his urologist to back off on testosterone if possible.  We have also discussed about hypercoagulable work-up.  If he does have any underlying hypercoagulable disorder, he may warrant indefinite anticoagulation.  If he also wishes to stay on testosterone supplementation, he may benefit from prolonged anticoagulation. Of course all anticoagulants, with risk of bleeding hence he was advised to think through these options.  Will call him with hypercoagulable work-up results if anything results positive.  If not I will plan to see him back in 6 months and review all the results then.  He was strongly encouraged to continue Xarelto until he comes back for a follow-up with [place].  HISTORY OF PRESENTING ILLNESS:  Phillip Hester 66 y.o. male is here because of DVT  of right lower extremity.  This is a very pleasant 66 year old male patient with past medical history significant for PE 2 years ago after hip replacement, DVT of the right lower extremity most recently currently on Xarelto starter pack referred to hematology for additional recommendations.  Patient denies any trauma, had some injection into the right groin for persistent right groin pain since her hip replacement about 3 weeks ago, outpatient.  No long distance travel.  He also resumed his testosterone injections about 3 to 4 weeks ago.  Prior to that he was on testosterone but took a 60-monthhiatus.  He recently had some labs and had low normal testosterone hence was resumed on it and also recently had a dose uptitration.  No family history of DVT/PE.  No sudden cardiac deaths in the family.  He also had thrombectomy yesterday and has noted some improvement.  No chest pain or shortness of breath today.  He is tolerating Xarelto very well without any bleeding complications.  He has a history of bladder cancer, has been in remission for several years and follows up with urology.  His right lower extremity swelling is better, currently in Ace wrap. Rest of the pertinent 10 point ROS reviewed and negative   MEDICAL HISTORY:  Past Medical History:  Diagnosis Date   Arthritis    knees   Cancer ([place]    bladder cancer   DVT (deep venous thrombosis) (HCC)    GERD (gastroesophageal reflux disease)    Heart murmur    diagnosed with "athlete's murmur" at 17   History of kidney stones    Hyperlipidemia    Pure hypercholesterolemia  Sleep apnea    CPAP at night    SURGICAL HISTORY: Past Surgical History:  Procedure Laterality Date   INGUINAL HERNIA REPAIR     IRRIGATION AND DEBRIDEMENT SEBACEOUS CYST     KNEE SURGERY Left 2013   PERIPHERAL VASCULAR BALLOON ANGIOPLASTY Right 03/23/2022   Procedure: PERIPHERAL VASCULAR BALLOON ANGIOPLASTY;  Surgeon: Serafina Mitchell, MD;  Location: [facility];  Service: Cardiovascular;  Laterality: Right;   PERIPHERAL VASCULAR THROMBECTOMY N/A 03/23/2022   Procedure: PERIPHERAL VASCULAR THROMBECTOMY;  Surgeon: Serafina Mitchell, MD;  Location: [facility];  Service: Cardiovascular;  Laterality: N/A;   SHOULDER SURGERY Right 2013   TOTAL HIP ARTHROPLASTY Right 11/20/2019   Procedure: RIGHT TOTAL HIP ARTHROPLASTY ANTERIOR APPROACH;  Surgeon: Mcarthur Rossetti, MD;  Location: [facility];  Service: Orthopedics;  Laterality: Right;   WISDOM TOOTH EXTRACTION      SOCIAL HISTORY: Social History   Socioeconomic History   Marital status: Legally Separated    Spouse name: Not on file   Number of children: 2   Years of education: Not on file   Highest education level: Not on file  Occupational History   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[employer]  Tobacco Use   Smoking status: Never   Smokeless tobacco: Never  Vaping Use   Vaping Use: Never used  Substance and Sexual Activity   Alcohol use: Yes    Alcohol/week: 2.0 - 3.0 standard drinks of alcohol    Types: 2 - 3 Standard drinks or equivalent per week    Comment: 2-3 drinks weekly   Drug use: No   Sexual activity: Yes  Other Topics Concern   Not on file  Social History Narrative   Not on file   Social Determinants of Health   Financial Resource Strain: Not on file  Food Insecurity: Not on file  Transportation Needs: Not on file  Physical Activity: Not on file  Stress: Not on file  Social Connections: Not on file  Intimate Partner Violence: Not on file    FAMILY HISTORY: Family History  Problem Relation Age of Onset   Arthritis Mother    Dementia Father    Cancer Maternal Grandfather    Cancer Paternal Grandfather     ALLERGIES:  is allergic to bee venom.  MEDICATIONS:  Current Outpatient Medications  Medication Sig Dispense Refill   albuterol (VENTOLIN HFA) 108 (90 Base) MCG/ACT inhaler Inhale 2 puffs into the lungs every 4 (four) hours as  needed.     amoxicillin (AMOXIL) 500 MG tablet Take 250-500 mg by mouth daily as needed.     Ascorbic Acid (VITAMIN C) 1000 MG tablet Take 3,000 mg by mouth daily.      Cholecalciferol (VITAMIN D3) 50 MCG (2000 UT) capsule Take 6,000 Units by mouth daily.     Omega-3-6-9 CAPS Take 1 capsule by mouth daily. 1300 mg     RIVAROXABAN (XARELTO) VTE STARTER PACK (15 & 20 MG) Follow package directions: Take one '15mg'$  tablet by mouth twice a day. On day 22, switch to one '20mg'$  tablet once a day. Take with food. (Patient taking differently: Take 15 mg by mouth 2 (two) times daily.) 51 each 0   testosterone cypionate (DEPOTESTOTERONE CYPIONATE) 100 MG/ML injection Inject into the muscle once a week. For IM use only     traZODone (DESYREL) 100 MG tablet Take 100 mg by mouth at bedtime as needed for sleep.     zinc gluconate  50 MG tablet Take 100 mg by mouth daily.     Current Facility-Administered Medications  Medication Dose Route Frequency Provider Last Rate Last Admin   betamethasone acetate-betamethasone sodium phosphate (CELESTONE) injection 3 mg  3 mg Intra-articular Once Edrick Kins, DPM         PHYSICAL EXAMINATION: ECOG PERFORMANCE STATUS: 0 - Asymptomatic  Vitals:   03/24/22 0955  BP: (!) 164/93  Pulse: 84  Resp: 14  Temp: 98.1 F (36.7 C)  SpO2: 98%   Filed Weights   03/24/22 0955  Weight: 249 lb 3.2 oz (113 kg)    Physical Exam Constitutional:      Appearance: Normal appearance.  Cardiovascular:     Rate and Rhythm: Normal rate and regular rhythm.     Pulses: Normal pulses.     Heart sounds: Normal heart sounds.  Pulmonary:     Effort: Pulmonary effort is normal.     Breath sounds: Normal breath sounds. No stridor.  Musculoskeletal:        General: Swelling (Asymmetrical right lower extremity swelling noted.  Currently dressed in Ace wrap) present.     Cervical back: Normal range of motion and neck supple. No rigidity.  Lymphadenopathy:     Cervical: No cervical  adenopathy.  Neurological:     Mental Status: He is alert.      LABORATORY DATA:  I have reviewed the data as listed Lab Results  Component Value Date   WBC 8.2 11/28/2019   HGB 18.0 (H) 03/23/2022   HCT 53.0 (H) 03/23/2022   MCV 91.8 11/28/2019   PLT 302 11/28/2019     Chemistry      Component Value Date/Time   NA 141 03/23/2022 1303   K 3.8 03/23/2022 1303   CL 107 03/23/2022 1303   CO2 26 11/28/2019 0456   BUN 19 03/23/2022 1303   CREATININE 1.00 03/23/2022 1303      Component Value Date/Time   CALCIUM 8.4 (L) 11/28/2019 0456   ALKPHOS 79 11/28/2019 0456   AST 51 (H) 11/28/2019 0456   ALT 75 (H) 11/28/2019 0456   BILITOT 0.6 11/28/2019 0456       RADIOGRAPHIC STUDIES: I have personally reviewed the radiological images as listed and agreed with the findings in the report. PERIPHERAL VASCULAR CATHETERIZATION  Result Date: 03/23/2022 Images from the original result were not included. [name] MRN: [id number] DOB: [birth year] Sex: male 03/23/2022 Pre-operative Diagnosis: Right leg DVT Post-operative diagnosis:  Same Surgeon:  Annamarie Major Procedure Performed:  1.  Ultrasound-guided access, right popliteal vein  2.  Intravascular ultrasound (IVUS): Inferior vena cava, right common iliac, external iliac, common femoral, femoral, and popliteal vein  3.  Mechanical thrombectomy: Right external iliac, common femoral, femoral, and popliteal vein  4.  Balloon venoplasty, right common iliac, external iliac, common femoral, femoral, and popliteal vein  5.  Right leg and inferior venacavogram  6.  Conscious sedation, 46 minutes  Indications: This is a 66 year old gentleman with acute onset of recurrent right leg DVT last week.  He remains very symptomatic and comes in today for thrombectomy. Procedure:  The patient was identified in the holding area and taken to room 8.  The patient was then placed supine on the table and prepped and draped in the usual sterile  fashion.  A time out was called.  Conscious sedation was administered with the use of IV fentanyl and Versed under continuous physician and nurse monitoring.  Heart rate, blood pressure, and  oxygen saturation were continuously monitored.  Total sedation time was 46 minutes.  Ultrasound was used to evaluate the right right popliteal vein which was full of thrombus and noncompressible.  Under ultrasound guidance, the right popliteal vein was cannulated with a micropuncture needle.  A 018 wire was inserted without resistance followed by placement of the micropuncture sheath.  A small contrast puff was performed to confirm venous cannulation.  Next a Glidewire advantage was inserted into the internal jugular vein.  An 8 French sheath was placed.  IVUS was used to evaluate the right popliteal, femoral, common femoral, external iliac, and common iliac veins as well as inferior vena cava.  There was thrombus up to the mid external iliac vein.  Next, the Inari sheath was inserted after the 8 French sheath was removed.  Next, the Inari thrombectomy device was inserted.  4 passes were made rotating the device with each pass.  Mechanical thrombectomy was performed of the right external iliac, common femoral, femoral, and popliteal vein.  Significant acute and subacute thrombus was evacuated.  I then evaluated the veins with IVUS and it appeared that we had evacuated nearly all of the thrombus.  I then performed balloon venoplasty of the external iliac, common femoral, femoral, and popliteal vein.  With a 10 x 80 Mustang balloon.  A contrast injection was performed which showed excellent flow through the popliteal and femoral veins as well as the external iliac, common iliac, and inferior vena cava.  There was luminal narrowing in the external iliac and common iliac veins and so I inserted a 12 x 80 Mustang balloon and repeated balloon venoplasty of the common iliac and external iliac vein.  Repeat imaging showed no hang up of  contrast within the veins although there was still some luminal narrowing within the iliac system however this was not flow-limiting.  I did not want to place a stent for this as there did not appear to be external compression.  I was satisfied with these results.  The Inari sheath was removed and a 200U stitch was placed with a silicone bumper to remove the sheath.  Sterile compression dressings were applied Impression:  #1  Successful venous mechanical thrombectomy of the right external iliac, common femoral, femoral, and popliteal veins V. Annamarie Major, M.D., [facility] [place] Office: [phone] Pager:  [phone]   VAS [place] LOWER EXTREMITY VENOUS (DVT)  Result Date: 03/20/2022  Lower Venous DVT Study Patient Name:  Phillip Hester  Date of Exam:   03/19/2022 Medical Rec #: [id number]         Accession #:    [id number] Date of Birth: 25-Sep-1955         Patient Gender: M Patient Age:   62 years Exam Location[facility]  [facility] Procedure:      VAS [place] LOWER EXTREMITY VENOUS (DVT) Referring Phys: Benjiman Core --------------------------------------------------------------------------------  Indications: Pain, and Swelling.  Risk Factors: HX of PE. Comparison Study: Previous exam 11/2019 was negative for DVT Performing Technologist: Rogelia Rohrer RVT, RDMS  Examination Guidelines: A complete evaluation includes B-mode imaging, spectral Doppler, color Doppler, and power Doppler as needed of all accessible portions of each vessel. Bilateral testing is considered an integral part of a complete examination. Limited examinations for reoccurring indications may be performed as noted. The reflux portion of the exam is performed with the patient in reverse Trendelenburg.  +---------+---------------+---------+-----------+----------+-------------------+ RIGHT    CompressibilityPhasicitySpontaneityPropertiesThrombus Aging       +---------+---------------+---------+-----------+----------+-------------------+ CFV  Partial        No       No                   Acute               +---------+---------------+---------+-----------+----------+-------------------+ SFJ      Partial                                      Acute               +---------+---------------+---------+-----------+----------+-------------------+ FV Prox  None           No       No                   Acute               +---------+---------------+---------+-----------+----------+-------------------+ FV Mid   None           No       No                   Acute               +---------+---------------+---------+-----------+----------+-------------------+ FV DistalNone           No       No                   Acute               +---------+---------------+---------+-----------+----------+-------------------+ PFV      Full                                                             +---------+---------------+---------+-----------+----------+-------------------+ POP      None           No       No                   Acute               +---------+---------------+---------+-----------+----------+-------------------+ PTV      None                                         Acute               +---------+---------------+---------+-----------+----------+-------------------+ PERO     Full                                         Not well visualized +---------+---------------+---------+-----------+----------+-------------------+ EIV      Full           Yes      Yes                                      +---------+---------------+---------+-----------+----------+-------------------+ Mobile thromus noted in CFV  +----+---------------+---------+-----------+----------+--------------+ LEFTCompressibilityPhasicitySpontaneityPropertiesThrombus Aging +----+---------------+---------+-----------+----------+--------------+ CFV  Full  Yes      Yes                                 +----+---------------+---------+-----------+----------+--------------+    Summary: RIGHT: - Findings consistent with acute deep vein thrombosis involving the right common femoral vein, SF junction, right femoral vein, right popliteal vein, and right posterior tibial veins. - No cystic structure found in the popliteal fossa.  LEFT: - No evidence of common femoral vein obstruction.  *See table(s) above for measurements and observations. Electronically signed by Orlie Pollen on 03/20/2022 at 11:11:21 AM.    Final    US Guided Needle Placement - No Linked Charges  Result Date: 03/02/2022 Technically successful ultrasound-guided iliopsoas tendon sheath injection.  [place] Extrem Low Right Ltd  Result Date: 03/02/2022 MSK Limited hip ultrasound performed, right:  Evaluation of the right hip joint shows femoral head and neck junction with associated hip arthroplasty.  There is no significant joint effusion noted.  Femoral shaft was identified without cortical irregularity.  Evaluation of the ASIS and AIIS with subsequent sartorius and rectus femoris tendons were identified without significant tendinopathy or evidence of gross tearing.  Iliopsoas tendon sheath was evaluated in both short and long axis, there is a mild degree of thickening of the tendon with trace hypoechoic fluid surrounding the tendon.  Scanning the psoas tendon dynamically I cannot reproduce any type of impingement or clicking of the psoas tendon.   Iliopsoas bursopathy. Technically successful ultrasound-guided iliopsoas tendon sheath injection.   XR HIP UNILAT W OR W/O PELVIS 1V RIGHT  Result Date: 03/01/2022 An AP pelvis and lateral the right hip shows a well-seated total hip arthroplasty on the right hip.   All questions were answered. The patient knows to call the clinic with any problems, questions or concerns. I spent 45 minutes in the care of this patient including H and  P, review of records, counseling and coordination of care.     Benay Pike, MD 03/24/2022 10:13 AM

## 2022-03-25 LAB — PROTEIN C ACTIVITY: Protein C Activity: 146 % (ref 73–180)

## 2022-03-25 LAB — CARDIOLIPIN ANTIBODIES, IGG, IGM, IGA
Anticardiolipin IgA: <9 APL U/mL (ref 0–11)
Anticardiolipin IgG: <9 GPL U/mL (ref 0–14)
Anticardiolipin IgM: <9 MPL U/mL (ref 0–12)

## 2022-03-25 LAB — PROTEIN S ACTIVITY: Protein S Activity: 128 % (ref 63–140)

## 2022-03-26 ENCOUNTER — Other Ambulatory Visit: Payer: Self-pay

## 2022-03-26 DIAGNOSIS — I82411 Acute embolism and thrombosis of right femoral vein: Secondary | ICD-10-CM

## 2022-03-26 DIAGNOSIS — M7989 Other specified soft tissue disorders: Secondary | ICD-10-CM

## 2022-03-26 LAB — BETA-2-GLYCOPROTEIN I ABS, IGG/M/A
Beta-2 Glyco I IgG: <9 GPI IgG units (ref 0–20)
Beta-2-Glycoprotein I IgA: <9 GPI IgA units (ref 0–25)
Beta-2-Glycoprotein I IgM: <9 GPI IgM units (ref 0–32)

## 2022-03-29 LAB — PTT-LA MIX: PTT-LA Mix: 29.1 s (ref 0.0–40.5)

## 2022-03-29 LAB — FACTOR 5 LEIDEN

## 2022-03-29 LAB — HEXAGONAL PHASE PHOSPHOLIPID: Hexagonal Phase Phospholipid: 9 s (ref 0–11)

## 2022-03-29 LAB — DRVVT MIX: dRVVT Mix: 93 s — ABNORMAL HIGH (ref 0.0–40.4)

## 2022-03-29 LAB — LUPUS ANTICOAGULANT PANEL
DRVVT: >180 s — ABNORMAL HIGH (ref 0.0–47.0)
PTT Lupus Anticoagulant: 44.4 s — ABNORMAL HIGH (ref 0.0–43.5)

## 2022-03-29 LAB — PROTHROMBIN GENE MUTATION

## 2022-03-29 LAB — HEXAGONAL PHOSPHOLIPID NEUTRALIZATION: Hexagonal Phospholipid Neutral: 11 s

## 2022-03-29 LAB — PTT-LA INCUB MIX: PTT-LA Incub Mix: 40.6 s — ABNORMAL HIGH (ref 0.0–40.5)

## 2022-03-29 LAB — DRVVT CONFIRM: dRVVT Confirm: >2.4 ratio — ABNORMAL HIGH (ref 0.8–1.2)

## 2022-04-08 ENCOUNTER — Telehealth: Payer: Self-pay | Admitting: *Deleted

## 2022-04-08 DIAGNOSIS — N400 Enlarged prostate without lower urinary tract symptoms: Secondary | ICD-10-CM | POA: Diagnosis not present

## 2022-04-08 DIAGNOSIS — N3289 Other specified disorders of bladder: Secondary | ICD-10-CM | POA: Diagnosis not present

## 2022-04-08 DIAGNOSIS — N200 Calculus of kidney: Secondary | ICD-10-CM | POA: Diagnosis not present

## 2022-04-08 DIAGNOSIS — N529 Male erectile dysfunction, unspecified: Secondary | ICD-10-CM | POA: Diagnosis not present

## 2022-04-08 DIAGNOSIS — C672 Malignant neoplasm of lateral wall of bladder: Secondary | ICD-10-CM | POA: Diagnosis not present

## 2022-04-08 DIAGNOSIS — N281 Cyst of kidney, acquired: Secondary | ICD-10-CM | POA: Diagnosis not present

## 2022-04-08 NOTE — Telephone Encounter (Signed)
This RN called pt yesterday per need to follow up in 6 months due to only lab of concern for clotting disorder is affected by his current blood thinner.  Pt had multiple questions including if needed to stay on the blood thinner until then since " I had the clot removed "  This RN informed him of need to follow up with MD per his question.  He also requested if the lab results could be mailed to him- address verified.  Per MD - per medical opinion he needs to stay on the blood thinner for 6 months despite having a thrombectomy.  This RN called pt and obtained his VM - with statement of mailbox is full - could not leave a message.

## 2022-04-09 ENCOUNTER — Telehealth: Payer: Self-pay | Admitting: *Deleted

## 2022-04-09 ENCOUNTER — Other Ambulatory Visit: Payer: Self-pay | Admitting: *Deleted

## 2022-04-09 MED ORDER — RIVAROXABAN 20 MG PO TABS: 20.0000 mg | ORAL_TABLET | Freq: Every day | ORAL | 6 refills | Status: DC

## 2022-04-09 NOTE — Telephone Encounter (Signed)
This RN was able to speak with pt this AM- reviewed MD recommendation for 6 month therapy for current DVT.  Mark verbalized understanding.  Refill sent to verified pharmacy for maintenance dose for rivaroxaban.

## 2022-04-29 DIAGNOSIS — L738 Other specified follicular disorders: Secondary | ICD-10-CM | POA: Diagnosis not present

## 2022-04-29 DIAGNOSIS — L080 Pyoderma: Secondary | ICD-10-CM | POA: Diagnosis not present

## 2022-04-29 DIAGNOSIS — D485 Neoplasm of uncertain behavior of skin: Secondary | ICD-10-CM | POA: Diagnosis not present

## 2022-04-29 DIAGNOSIS — L821 Other seborrheic keratosis: Secondary | ICD-10-CM | POA: Diagnosis not present

## 2022-04-29 DIAGNOSIS — Z85828 Personal history of other malignant neoplasm of skin: Secondary | ICD-10-CM | POA: Diagnosis not present

## 2022-04-29 DIAGNOSIS — L570 Actinic keratosis: Secondary | ICD-10-CM | POA: Diagnosis not present

## 2022-05-03 ENCOUNTER — Ambulatory Visit (HOSPITAL_COMMUNITY)
Admission: RE | Admit: 2022-05-03 | Discharge: 2022-05-03 | Disposition: A | Discharge status: Home / Self Care | Payer: BC Managed Care – PPO | Source: Ambulatory Visit | Attending: Surgery | Admitting: Surgery

## 2022-05-03 ENCOUNTER — Ambulatory Visit (INDEPENDENT_AMBULATORY_CARE_PROVIDER_SITE_OTHER): Payer: BC Managed Care – PPO | Admitting: Physician Assistant

## 2022-05-03 ENCOUNTER — Ambulatory Visit (INDEPENDENT_AMBULATORY_CARE_PROVIDER_SITE_OTHER)
Admission: RE | Admit: 2022-05-03 | Discharge: 2022-05-03 | Disposition: A | Discharge status: Home / Self Care | Payer: BC Managed Care – PPO | Source: Ambulatory Visit | Attending: Surgery | Admitting: Surgery

## 2022-05-03 VITALS — BP 141/85 | HR 66 | Temp 97.8°F | Resp 16 | Ht 75.0 in | Wt 249.0 lb

## 2022-05-03 DIAGNOSIS — M7989 Other specified soft tissue disorders: Secondary | ICD-10-CM

## 2022-05-03 DIAGNOSIS — I82411 Acute embolism and thrombosis of right femoral vein: Secondary | ICD-10-CM | POA: Diagnosis not present

## 2022-05-03 NOTE — Progress Notes (Signed)
[facility]     History of Present Illness  Phillip Hester is a 66 y.o. male who presents with chief complaint: acute swollen right leg.  He has an acute right LE DVT with a history of  DVT and PE following hip replacement surgery.  Prior to this event, he had completed his dose of Eliquis.    DVT duplex:  acute deep vein thrombosis involving the right common femoral vein, SF junction, right femoral vein, right popliteal vein, and right posterior tibial veins.   He returns for exam S/P  Mechanical thrombectomy: Right external iliac, common femoral, femoral, and popliteal vein, Balloon venoplasty, right common iliac, external iliac, common femoral, femoral, and popliteal vein.  He has been placed in thigh high compression 20-30 mm hg.    He is medically managed on Xarelto 20 mg QD.  He is being worked up by [employer]         Past Medical History:  Diagnosis Date   Arthritis    knees   Cancer ([place])    bladder cancer   DVT (deep venous thrombosis) (HCC)    GERD (gastroesophageal reflux disease)    Heart murmur    diagnosed with "athlete's murmur" at 17   History of kidney stones    Hyperlipidemia    Pure hypercholesterolemia    Sleep apnea    CPAP at night    Past Surgical History:  Procedure Laterality Date   INGUINAL HERNIA REPAIR     IRRIGATION AND DEBRIDEMENT SEBACEOUS CYST     KNEE SURGERY Left 2013   PERIPHERAL VASCULAR BALLOON ANGIOPLASTY Right 03/23/2022   Procedure: PERIPHERAL VASCULAR BALLOON ANGIOPLASTY;  Surgeon: Serafina Mitchell, MD;  Location: [facility];  Service: Cardiovascular;  Laterality: Right;   PERIPHERAL VASCULAR THROMBECTOMY N/A 03/23/2022   Procedure: PERIPHERAL VASCULAR THROMBECTOMY;  Surgeon: Serafina Mitchell, MD;  Location: [facility];  Service: Cardiovascular;  Laterality: N/A;   SHOULDER SURGERY Right 2013   TOTAL HIP ARTHROPLASTY Right 11/20/2019   Procedure: RIGHT TOTAL HIP ARTHROPLASTY  ANTERIOR APPROACH;  Surgeon: Mcarthur Rossetti, MD;  Location: [facility];  Service: Orthopedics;  Laterality: Right;   WISDOM TOOTH EXTRACTION      Social History   Socioeconomic History   Marital status: Legally Separated    Spouse name: Not on file   Number of children: 2   Years of education: Not on file   Highest education level: Not on file  Occupational History   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[employer]  Tobacco Use   Smoking status: Never   Smokeless tobacco: Never  Vaping Use   Vaping Use: Never used  Substance and Sexual Activity   Alcohol use: Yes    Alcohol/week: 2.0 - 3.0 standard drinks of alcohol    Types: 2 - 3 Standard drinks or equivalent per week    Comment: 2-3 drinks weekly   Drug use: No   Sexual activity: Yes  Other Topics Concern   Not on file  Social History Narrative   Not on file   Social Determinants of Health   Financial Resource Strain: Not on file  Food Insecurity: Not on file  Transportation Needs: Not on file  Physical Activity: Not on file  Stress: Not on file  Social Connections: Not on file  Intimate Partner Violence: Not on file    Family History  Problem Relation Age of Onset   Arthritis Mother  Dementia Father    Cancer Maternal Grandfather    Cancer Paternal Grandfather     Current Outpatient Medications on File Prior to Visit  Medication Sig Dispense Refill   albuterol (VENTOLIN HFA) 108 (90 Base) MCG/ACT inhaler Inhale 2 puffs into the lungs every 4 (four) hours as needed.     amoxicillin (AMOXIL) 500 MG tablet Take 250-500 mg by mouth daily as needed.     Ascorbic Acid (VITAMIN C) 1000 MG tablet Take 3,000 mg by mouth daily.      Cholecalciferol (VITAMIN D3) 50 MCG (2000 UT) capsule Take 6,000 Units by mouth daily.     Omega-3-6-9 CAPS Take 1 capsule by mouth daily. 1300 mg     rivaroxaban (XARELTO) 20 MG TABS tablet Take 1 tablet (20 mg total) by mouth daily with supper. 30 tablet 6    traZODone (DESYREL) 100 MG tablet Take 100 mg by mouth at bedtime as needed for sleep.     zinc gluconate 50 MG tablet Take 100 mg by mouth daily.     testosterone cypionate (DEPOTESTOTERONE CYPIONATE) 100 MG/ML injection Inject into the muscle once a week. For IM use only (Patient not taking: Reported on 05/03/2022)     Current Facility-Administered Medications on File Prior to Visit  Medication Dose Route Frequency Provider Last Rate Last Admin   betamethasone acetate-betamethasone sodium phosphate (CELESTONE) injection 3 mg  3 mg Intra-articular Once Edrick Kins, DPM        Allergies as of 05/03/2022 - Review Complete 05/03/2022  Allergen Reaction Noted   Bee venom Anaphylaxis 08/03/2012     ROS:   General:  No weight loss, Fever, chills  HEENT: No recent headaches, no nasal bleeding, no visual changes, no sore throat  Neurologic: No dizziness, blackouts, seizures. No recent symptoms of stroke or mini- stroke. No recent episodes of slurred speech, or temporary blindness.  Cardiac: No recent episodes of chest pain/pressure, no shortness of breath at rest.  No shortness of breath with exertion.  Denies history of atrial fibrillation or irregular heartbeat  Vascular: No history of rest pain in feet.  No history of claudication.  No history of non-healing ulcer, No history of DVT   Pulmonary: No home oxygen, no productive cough, no hemoptysis,  No asthma or wheezing  Musculoskeletal:  '[ ]'$  Arthritis, '[ ]'$  Low back pain,  '[ ]'$  Joint pain  Hematologic:No history of hypercoagulable state.  No history of easy bleeding.  No history of anemia  Gastrointestinal: No hematochezia or melena,  No gastroesophageal reflux, no trouble swallowing  Urinary: '[ ]'$  chronic Kidney disease, '[ ]'$  on HD - '[ ]'$  MWF or '[ ]'$  TTHS, '[ ]'$  Burning with urination, '[ ]'$  Frequent urination, '[ ]'$  Difficulty urinating;   Skin: No rashes  Psychological: No history of anxiety,  No history of depression  Physical  Examination  Vitals:   05/03/22 0835  BP: (!) 141/85  Pulse: 66  Resp: 16  Temp: 97.8 F (36.6 C)  TempSrc: Temporal  SpO2: 98%  Weight: 249 lb (112.9 kg)  Height: '6\' 3"'$  (1.905 m)    Body mass index is 31.12 kg/m.  General:  Alert and oriented, no acute distress HEENT: Normal Neck: No bruit or JVD Pulmonary: Clear to auscultation bilaterally Cardiac: Regular Rate and Rhythm without murmur Abdomen: Soft, non-tender, non-distended, no mass, no scars Skin: No rash Extremity Pulses: right DP palpable pedal pulse.   Musculoskeletal: No deformity or edema  Neurologic: Upper and lower extremity motor  5/5 and symmetric  DATA: IVC/Iliac Findings:  +----------+------+--------+--------+    IVC    PatentThrombusComments  +----------+------+--------+--------+  IVC Mid   patent                  +----------+------+--------+--------+  IVC Distalpatent                  +----------+------+--------+--------+     +-------------------+---------+-----------+---------+-----------+--------+         CIV        RT-PatentRT-ThrombusLT-PatentLT-ThrombusComments  +-------------------+---------+-----------+---------+-----------+--------+  Common Iliac Prox   patent                                           +-------------------+---------+-----------+---------+-----------+--------+  Common Iliac Distal patent                                           +-------------------+---------+-----------+---------+-----------+--------+      +-------------------------+---------+-----------+---------+-----------+----  ----+            EIV            RT-PatentRT-ThrombusLT-PatentLT-ThrombusComments  +-------------------------+---------+-----------+---------+-----------+----  ----+  External Iliac Vein Mid   patent                                            +-------------------------+---------+-----------+---------+-----------+----  ----+  External Iliac  Vein       patent                                            Distal                                                                      +-------------------------+---------+-----------+---------+-----------+----  ----+     Summary:  IVC/Iliac: No evidence of thrombus in IVC and right Iliac veins.  Visualization of proximal Inferior Vena Cava and proximal external Iliac  vein was limited.     Assessment: 66 y.o. male postoperative day #1 after mechanical thrombectomy of the right external iliac, common femoral, and femoral vein due to recurrent DVT.   There is no evidence of thrombus in IVC and right Iliac veins.   No apparent edema noted on exam.     Plan:He can go back to his knee high compression.  If he has return of edema he would benefit from the compression thigh high socks.  Walk for exercise daily, elevation when at rest.  Continue Xarelto for life since this was his second DVT event.  He will f/u in 6-9 months for surveillance duplex.   Roxy Horseman PA-C [facility] Office: [phone]  MD on call Donzetta Matters

## 2022-05-04 DIAGNOSIS — R790 Abnormal level of blood mineral: Secondary | ICD-10-CM | POA: Diagnosis not present

## 2022-05-04 DIAGNOSIS — E039 Hypothyroidism, unspecified: Secondary | ICD-10-CM | POA: Diagnosis not present

## 2022-05-04 DIAGNOSIS — E782 Mixed hyperlipidemia: Secondary | ICD-10-CM | POA: Diagnosis not present

## 2022-05-04 DIAGNOSIS — R5383 Other fatigue: Secondary | ICD-10-CM | POA: Diagnosis not present

## 2022-05-04 DIAGNOSIS — E291 Testicular hypofunction: Secondary | ICD-10-CM | POA: Diagnosis not present

## 2022-05-04 DIAGNOSIS — R7309 Other abnormal glucose: Secondary | ICD-10-CM | POA: Diagnosis not present

## 2022-05-05 ENCOUNTER — Other Ambulatory Visit: Payer: Self-pay

## 2022-05-05 DIAGNOSIS — I82411 Acute embolism and thrombosis of right femoral vein: Secondary | ICD-10-CM

## 2022-05-05 DIAGNOSIS — M7989 Other specified soft tissue disorders: Secondary | ICD-10-CM

## 2022-05-17 DIAGNOSIS — H25813 Combined forms of age-related cataract, bilateral: Secondary | ICD-10-CM | POA: Diagnosis not present

## 2022-05-17 DIAGNOSIS — H524 Presbyopia: Secondary | ICD-10-CM | POA: Diagnosis not present

## 2022-05-17 DIAGNOSIS — H43811 Vitreous degeneration, right eye: Secondary | ICD-10-CM | POA: Diagnosis not present

## 2022-05-17 DIAGNOSIS — H35363 Drusen (degenerative) of macula, bilateral: Secondary | ICD-10-CM | POA: Diagnosis not present

## 2022-05-17 DIAGNOSIS — H35371 Puckering of macula, right eye: Secondary | ICD-10-CM | POA: Diagnosis not present

## 2022-05-18 ENCOUNTER — Telehealth: Payer: Self-pay

## 2022-05-18 NOTE — Telephone Encounter (Signed)
Pt called to ask if if would be ok to give blood while on Xarelto.  Reviewed pt's chart, spoke to [place], [place], returned call, no answer, unable to leave vm.

## 2022-06-03 ENCOUNTER — Encounter (HOSPITAL_COMMUNITY): Payer: Self-pay | Admitting: Surgery

## 2022-06-21 DIAGNOSIS — N521 Erectile dysfunction due to diseases classified elsewhere: Secondary | ICD-10-CM | POA: Diagnosis not present

## 2022-06-21 DIAGNOSIS — Z8551 Personal history of malignant neoplasm of bladder: Secondary | ICD-10-CM | POA: Diagnosis not present

## 2022-06-21 DIAGNOSIS — N200 Calculus of kidney: Secondary | ICD-10-CM | POA: Diagnosis not present

## 2022-06-21 DIAGNOSIS — N528 Other male erectile dysfunction: Secondary | ICD-10-CM | POA: Diagnosis not present

## 2022-07-07 ENCOUNTER — Ambulatory Visit (INDEPENDENT_AMBULATORY_CARE_PROVIDER_SITE_OTHER): Payer: Medicare Other | Admitting: Podiatry

## 2022-07-07 DIAGNOSIS — M7672 Peroneal tendinitis, left leg: Secondary | ICD-10-CM | POA: Diagnosis not present

## 2022-07-07 MED ORDER — BETAMETHASONE SOD PHOS & ACET 6 (3-3) MG/ML IJ SUSP
3.0000 mg | Freq: Once | INTRAMUSCULAR | Status: AC
  Administered 2022-07-07: 3 mg via INTRA_ARTICULAR

## 2022-07-07 NOTE — Progress Notes (Signed)
HPI: 67 y.o. male presenting today for follow-up evaluation of left foot peroneal tendinitis.  Patient states that recently he was diagnosed with a DVT of the right lower extremity and underwent surgical intervention with Dr. Annamarie Major, vein and vascular.  This occurred back in October.  Patient continues to have pain and tenderness to the lateral aspect of the left ankle.  Last year he was scheduled for surgery however the patient lives alone and he was waiting for Medicare insurance before proceeding with surgery.  He presents for further treatment evaluation  Past Medical History:  Diagnosis Date   Arthritis    knees   Cancer ([place])    bladder cancer   DVT (deep venous thrombosis) (HCC)    GERD (gastroesophageal reflux disease)    Heart murmur    diagnosed with "athlete's murmur" at 17   History of kidney stones    Hyperlipidemia    Pure hypercholesterolemia    Sleep apnea    CPAP at night    Past Surgical History:  Procedure Laterality Date   INGUINAL HERNIA REPAIR     IRRIGATION AND DEBRIDEMENT SEBACEOUS CYST     KNEE SURGERY Left 2013   PERIPHERAL VASCULAR BALLOON ANGIOPLASTY Right 03/23/2022   Procedure: PERIPHERAL VASCULAR BALLOON ANGIOPLASTY;  Surgeon: Serafina Mitchell, MD;  Location: [facility];  Service: Cardiovascular;  Laterality: Right;   PERIPHERAL VASCULAR THROMBECTOMY N/A 03/23/2022   Procedure: PERIPHERAL VASCULAR THROMBECTOMY;  Surgeon: Serafina Mitchell, MD;  Location: [facility];  Service: Cardiovascular;  Laterality: N/A;   SHOULDER SURGERY Right 2013   TOTAL HIP ARTHROPLASTY Right 11/20/2019   Procedure: RIGHT TOTAL HIP ARTHROPLASTY ANTERIOR APPROACH;  Surgeon: Mcarthur Rossetti, MD;  Location: [facility];  Service: Orthopedics;  Laterality: Right;   WISDOM TOOTH EXTRACTION      Allergies  Allergen Reactions   Bee Venom Anaphylaxis     Physical Exam: General: The patient is alert and oriented x3 in no acute distress.  Dermatology:  Skin is warm, dry and supple bilateral lower extremities. Negative for open lesions or macerations.  Vascular: Palpable pedal pulses bilaterally. Capillary refill within normal limits.  Negative for any significant edema or erythema  Neurological: Light touch and protective threshold grossly intact  Musculoskeletal Exam: There continues to be pain on palpation along the peroneal tendon left foot as well as along the lateral aspect of the left ankle joint..  Palpation of the cuboid does not elicit any pain or symptoms.   MRI LT foot WO contrast 05/27/2021:  IMPRESSION: 1. Nondisplaced fracture of the cuboid bone. 2. Mild focal bone marrow edema along the plantar aspect of the navicular without a discernible fracture line. Findings may represent a stress reaction. 3. The plantar cuneonavicular ligament is heterogeneous and edematous, at least partially torn. 4. Mild tendinosis and tenosynovitis of the distal peroneus longus tendon.  MRI LT FOOT WO CONTRAST 11/03/2021: Peroneal: The peroneus brevis tendon is intact. There is linear fluid bright signal indicating a longitudinal split tear of the peroneus longus tendon starting just distal to the fibula and involving an approximate 4 cm length of the tendon down to the plantar lateral aspect of the anterior process of the calcaneus (axial series 3 images 24 through 35). Portions of this tear extend through both the anterior and posterior walls incompletely split the tendon longitudinally.  IMPRESSION: Compared to 04/06/2021: 1. Interval healing of prior cuboid fracture. Resolution of the soft tissue edema plantar to the navicular. 2.  There is an extensive longitudinal split tear of the peroneus longus tendon starting just distal to the fibula and involving a 4 cm length of the tendon. 3. High-grade thinning of the anterolateral tibial plafond cartilage. 4. No bone abnormality is seen to correspond to the area of relative lucency seen  on 08/31/2021 radiographs overlying the distal tibial diaphysis.   Assessment: 1.  Nondisplaced fracture cuboid LT; healed 2.  Longitudinal split tear peroneus longus tendon LT 3.  Capsulitis left ankle   Plan of Care:  1. Patient evaluated.   2.  Injection of 0.5 cc Celestone Soluspan injected along the peroneal tendon sheath left.  This will provide temporary relief for the patient to keep his symptoms minimal until surgery 3.  Due to the patient's recent DVT would recommend avoiding any elective procedures for 6 months after diagnosis of the DVT.  He will need vascular clearance 4.  Today again we discussed surgery in detail which includes repair of the peroneus longus tendon.  Risk benefits advantages and disadvantages as well as the postoperative recovery course were explained in detail.  Patient lives alone and he will be strictly nonweightbearing for minimum 4 weeks after surgery.  Patient states that he will need to plan for this event.  For now we will continue conservative treatment and postpone surgery until 6 months after his DVT episode 5.  Return to clinic 3 months for reevaluation and possible surgical consult  *Goes by Elta Guadel[place].     Edrick Kins, DPM [facility]  Dr. Edrick Kins, DPM    [address], [place] [address]                Office [phone]  Fax [phone]

## 2022-07-15 DIAGNOSIS — N528 Other male erectile dysfunction: Secondary | ICD-10-CM | POA: Diagnosis not present

## 2022-07-15 DIAGNOSIS — Z8042 Family history of malignant neoplasm of prostate: Secondary | ICD-10-CM | POA: Diagnosis not present

## 2022-07-15 DIAGNOSIS — N200 Calculus of kidney: Secondary | ICD-10-CM | POA: Diagnosis not present

## 2022-07-15 DIAGNOSIS — Z8551 Personal history of malignant neoplasm of bladder: Secondary | ICD-10-CM | POA: Diagnosis not present

## 2022-07-15 DIAGNOSIS — R972 Elevated prostate specific antigen [PSA]: Secondary | ICD-10-CM | POA: Diagnosis not present

## 2022-07-15 DIAGNOSIS — N281 Cyst of kidney, acquired: Secondary | ICD-10-CM | POA: Diagnosis not present

## 2022-07-16 DIAGNOSIS — M79641 Pain in right hand: Secondary | ICD-10-CM | POA: Diagnosis not present

## 2022-08-23 DIAGNOSIS — E291 Testicular hypofunction: Secondary | ICD-10-CM | POA: Diagnosis not present

## 2022-08-23 DIAGNOSIS — R5383 Other fatigue: Secondary | ICD-10-CM | POA: Diagnosis not present

## 2022-08-25 DIAGNOSIS — R790 Abnormal level of blood mineral: Secondary | ICD-10-CM | POA: Diagnosis not present

## 2022-08-25 DIAGNOSIS — K589 Irritable bowel syndrome without diarrhea: Secondary | ICD-10-CM | POA: Diagnosis not present

## 2022-08-25 DIAGNOSIS — E782 Mixed hyperlipidemia: Secondary | ICD-10-CM | POA: Diagnosis not present

## 2022-08-25 DIAGNOSIS — E291 Testicular hypofunction: Secondary | ICD-10-CM | POA: Diagnosis not present

## 2022-09-14 ENCOUNTER — Ambulatory Visit (INDEPENDENT_AMBULATORY_CARE_PROVIDER_SITE_OTHER): Payer: Medicare Other | Admitting: Nurse Practitioner

## 2022-09-14 ENCOUNTER — Encounter: Payer: Self-pay | Admitting: Nurse Practitioner

## 2022-09-14 VITALS — BP 118/80 | HR 71 | Ht 74.0 in | Wt 257.2 lb

## 2022-09-14 DIAGNOSIS — G4733 Obstructive sleep apnea (adult) (pediatric): Secondary | ICD-10-CM | POA: Diagnosis not present

## 2022-09-14 NOTE — Patient Instructions (Addendum)
Continue to use CPAP every night, minimum of 4-6 hours a night.  Change equipment every 30 days or as directed by DME. Wash your tubing with warm soap and water daily, hang to dry. Wash humidifier portion weekly.  Be aware of reduced alertness and do not drive or operate heavy machinery if experiencing this or drowsiness.  Notify if persistent daytime sleepiness occurs even with consistent use of CPAP.  We will repeat your sleep study. If you still have evidence of sleep apnea, we will be able to get you a new CPAP machine. Someone will contact you to schedule this study   We discussed how untreated sleep apnea puts an individual at risk for cardiac arrhthymias, pulm HTN, DM, stroke and increases their risk for daytime accidents. We also briefly reviewed treatment options including weight loss, side sleeping position, oral appliance, CPAP therapy or referral to ENT for possible surgical options  Use caution when driving and pull over if you become sleepy.  Follow up in 6 weeks with Phillip Kina Shiffman,NP to go over sleep study results, or sooner, if needed

## 2022-09-14 NOTE — Progress Notes (Signed)
  ID: Phillip Hester, male    DOB: Sep 25, 1955, 67 y.o.   MRN: [id number]  Chief Complaint  Patient presents with   Consult    Pt sleep consult has been using CPAP for >76yrs, he states he dpesn't feel like he gets good benefit any longer from the therapy.     Referring provider: Lance Bosch, NP  HPI: 67 year old male, never smoker referred for sleep consult.  Past medical history significant for history of PE on Xarelto, OSA on CPAP, bladder cancer, HLD, depression, ED, vitamin B12 deficiency, obesity.  TEST/EVENTS:  11/2008 PSG: AHI 24/h, supine AHI was 59/h  09/14/2022: Today - sleep consult  Patient presents today for sleep consult.  He was previously followed by Dr. Vassie Loll and last seen in office 2015.  Since then he has not had anyone who has routinely monitored his sleep apnea or CPAP machine.  He was at an appointment with his PCP and was discussing his increasing fatigue symptoms over the last couple of months.  He is waking up feeling less rested and more tired in the afternoons.  They recommended that he have a sleep consult determine how well his CPAP is controlling him currently.  His machine is very old, close to 10 years.  He did bring an SD card with him today but there was no data on it.  He does tell me that he sleeps with his CPAP every night.  Previously helped him feel better during the day.  He does feel tired when he is driving but has not fallen asleep while driving in many years.  Denies any morning headaches, sleep parasomnia/paralysis, breakthrough snoring.  No history of narcolepsy or symptoms of cataplexy. He goes to bed between 930 to 10 PM.  Falls asleep within 10 to 15 minutes.  Takes trazodone for sleep aid.  Wakes 2-3 times a night.  Officially gets up around 6 to 6:30 AM.  Does not operate heavy machinery in his job field.  He is 25 pounds up over the last 2 years.  His last sleep study was 20 years ago.  He believes his CPAP is set at 10 cm of water.   He currently wears a nasal pillow mask. No history of hypertension, diabetes or stroke.  He is a never smoker.  Drinks 5-7 alcoholic beverages a week.  No excessive caffeine intake.  He is self-employed and owns his own business.  Lives with his dog.  No significant family history.  [place] [address]   Allergies  Allergen Reactions   Bee Venom Anaphylaxis     There is no immunization history on file for this patient.  Past Medical History:  Diagnosis Date   Arthritis    knees   Cancer    bladder cancer   DVT (deep venous thrombosis)    GERD (gastroesophageal reflux disease)    Heart murmur    diagnosed with "athlete's murmur" at 17   History of kidney stones    Hyperlipidemia    Pure hypercholesterolemia    Sleep apnea    CPAP at night    Tobacco History: Social History   Tobacco Use  Smoking Status Never  Smokeless Tobacco Never   Counseling given: Not Answered   Outpatient Medications Prior to Visit  Medication Sig Dispense Refill   Ascorbic Acid (VITAMIN C) 1000 MG tablet Take 3,000 mg by mouth daily.      buPROPion (WELLBUTRIN XL) 300 MG 24 hr tablet Take 1 tablet by mouth daily.  Cholecalciferol (VITAMIN D3) 50 MCG (2000 UT) capsule Take 6,000 Units by mouth daily.     Cyanocobalamin 1000 MCG TBCR Take by mouth.     hydrocortisone (CORTEF) 5 MG tablet Take 5 mg by mouth every morning.     MAGNESIUM PO Take by mouth.     melatonin 3 MG TABS tablet Take 3 mg by mouth at bedtime as needed.     rivaroxaban (XARELTO) 20 MG TABS tablet Take 1 tablet (20 mg total) by mouth daily with supper. 30 tablet 6   tamsulosin (FLOMAX) 0.4 MG CAPS capsule Take 0.4 mg by mouth daily.     testosterone cypionate (DEPOTESTOTERONE CYPIONATE) 100 MG/ML injection Inject into the muscle once a week. For IM use only     traZODone (DESYREL) 100 MG tablet Take 100 mg by mouth at bedtime as needed for sleep.     zinc gluconate 50 MG tablet Take 100 mg by mouth daily.     albuterol (VENTOLIN  HFA) 108 (90 Base) MCG/ACT inhaler Inhale 2 puffs into the lungs every 4 (four) hours as needed. (Patient not taking: Reported on 09/14/2022)     amoxicillin (AMOXIL) 500 MG tablet Take 250-500 mg by mouth daily as needed. (Patient not taking: Reported on 09/14/2022)     Omega-3-6-9 CAPS Take 1 capsule by mouth daily. 1300 mg (Patient not taking: Reported on 09/14/2022)     Facility-Administered Medications Prior to Visit  Medication Dose Route Frequency Provider Last Rate Last Admin   betamethasone acetate-betamethasone sodium phosphate (CELESTONE) injection 3 mg  3 mg Intra-articular Once Felecia Shelling, DPM         Review of Systems:   Constitutional: No night sweats, fevers, chills,or lassitude. +fatigue, weight gain HEENT: No headaches, difficulty swallowing, tooth/dental problems, or sore throat. No sneezing, itching, ear ache, nasal congestion, or post nasal drip CV:  No chest pain, orthopnea, PND, swelling in lower extremities, anasarca, dizziness, palpitations, syncope Resp: No shortness of breath with exertion or at rest. No excess mucus or change in color of mucus. No productive or non-productive. No hemoptysis. No wheezing.  No chest wall deformity GI:  +heartburn, indigestion. No abdominal pain, nausea, vomiting, diarrhea, change in bowel habits, loss of appetite, bloody stools.  GU: No dysuria, change in color of urine, urgency or frequency.   Skin: No rash, lesions, ulcerations MSK:  No joint pain or swelling.   Neuro: No dizziness or lightheadedness.  Psych: +depression, anxiety (stable). Mood stable. +sleep disturbance    Physical Exam:  BP 118/80   Pulse 71   Ht 6\' 2"  (1.88 m)   Wt 257 lb 3.2 oz (116.7 kg)   SpO2 98%   BMI 33.02 kg/m   GEN: Pleasant, interactive, well-appearing; obese; in no acute distress. HEENT:  Normocephalic and atraumatic. PERRLA. Sclera white. Nasal turbinates pink, moist and patent bilaterally. No rhinorrhea present. Oropharynx pink and moist,  without exudate or edema. No lesions, ulcerations, or postnasal drip. Mallampati III NECK:  Supple w/ fair ROM. No JVD present. Normal carotid impulses w/o bruits. Thyroid symmetrical with no goiter or nodules palpated. No lymphadenopathy.   CV: RRR, no m/r/g, no peripheral edema. Pulses intact, +2 bilaterally. No cyanosis, pallor or clubbing. PULMONARY:  Unlabored, regular breathing. Clear bilaterally A&P w/o wheezes/rales/rhonchi. No accessory muscle use.  GI: BS present and normoactive. Soft, non-tender to palpation. No organomegaly or masses detected.  MSK: No erythema, warmth or tenderness. Cap refil <2 sec all extrem. No deformities or joint swelling noted.  Neuro: A/Ox3. No focal deficits noted.   Skin: Warm, no lesions or rashe Psych: Normal affect and behavior. Judgement and thought content appropriate.     Lab Results:  CBC    Component Value Date/Time   WBC 8.2 11/28/2019 0456   RBC 4.64 11/28/2019 0456   HGB 18.0 (H) 03/23/2022 1303   HCT 53.0 (H) 03/23/2022 1303   PLT 302 11/28/2019 0456   MCV 91.8 11/28/2019 0456   MCH 30.2 11/28/2019 0456   MCHC 32.9 11/28/2019 0456   RDW 13.8 11/28/2019 0456    BMET    Component Value Date/Time   NA 141 03/23/2022 1303   K 3.8 03/23/2022 1303   CL 107 03/23/2022 1303   CO2 26 11/28/2019 0456   GLUCOSE 80 03/23/2022 1303   BUN 19 03/23/2022 1303   CREATININE 1.00 03/23/2022 1303   CALCIUM 8.4 (L) 11/28/2019 0456   GFRNONAA >60 11/28/2019 0456   GFRAA >60 11/28/2019 0456    BNP No results found for: "BNP"   Imaging:  No results found.        No data to display          No results found for: "NITRICOXIDE"      Assessment & Plan:   OSA on CPAP History of moderate to severe OSA. He has been on CPAP for many years now; machine is around 66 years old. Unfortunately, no download available. Given this and length of time since his last sleep study, he will need to complete repeat home sleep study. Orders  placed today. I suspect his breakthrough symptoms are due to poor control and possible CPAP motor malfunction, given age. Cautioned on safe driving practices.    - discussed how weight can impact sleep and risk for sleep disordered breathing - discussed options to assist with weight loss: combination of diet modification, cardiovascular and strength training exercises   - had an extensive discussion regarding the adverse health consequences related to untreated sleep disordered breathing - specifically discussed the risks for hypertension, coronary artery disease, cardiac dysrhythmias, cerebrovascular disease, and diabetes - lifestyle modification discussed   - discussed how sleep disruption can increase risk of accidents, particularly when driving - safe driving practices were discussed  Patient Instructions  Continue to use CPAP every night, minimum of 4-6 hours a night.  Change equipment every 30 days or as directed by DME. Wash your tubing with warm soap and water daily, hang to dry. Wash humidifier portion weekly.  Be aware of reduced alertness and do not drive or operate heavy machinery if experiencing this or drowsiness.  Notify if persistent daytime sleepiness occurs even with consistent use of CPAP.  We will repeat your sleep study. If you still have evidence of sleep apnea, we will be able to get you a new CPAP machine. Someone will contact you to schedule this study   We discussed how untreated sleep apnea puts an individual at risk for cardiac arrhthymias, pulm HTN, DM, stroke and increases their risk for daytime accidents. We also briefly reviewed treatment options including weight loss, side sleeping position, oral appliance, CPAP therapy or referral to ENT for possible surgical options  Use caution when driving and pull over if you become sleepy.  Follow up in 6 weeks with Florentina Addison Enmanuel Zufall,NP to go over sleep study results, or sooner, if needed     I spent 35 minutes of dedicated  to the care of this patient on the date of this encounter to include pre-visit review  of records, face-to-face time with the patient discussing conditions above, post visit ordering of testing, clinical documentation with the electronic health record, making appropriate referrals as documented, and communicating necessary findings to members of the patients care team.  Noemi Chapel, NP 09/17/2022  Pt aware and understands NP's role.

## 2022-09-17 ENCOUNTER — Encounter: Payer: Self-pay | Admitting: Nurse Practitioner

## 2022-09-17 NOTE — Assessment & Plan Note (Signed)
History of moderate to severe OSA. He has been on CPAP for many years now; machine is around 66 years old. Unfortunately, no download available. Given this and length of time since his last sleep study, he will need to complete repeat home sleep study. Orders placed today. I suspect his breakthrough symptoms are due to poor control and possible CPAP motor malfunction, given age. Cautioned on safe driving practices.    - discussed how weight can impact sleep and risk for sleep disordered breathing - discussed options to assist with weight loss: combination of diet modification, cardiovascular and strength training exercises   - had an extensive discussion regarding the adverse health consequences related to untreated sleep disordered breathing - specifically discussed the risks for hypertension, coronary artery disease, cardiac dysrhythmias, cerebrovascular disease, and diabetes - lifestyle modification discussed   - discussed how sleep disruption can increase risk of accidents, particularly when driving - safe driving practices were discussed  Patient Instructions  Continue to use CPAP every night, minimum of 4-6 hours a night.  Change equipment every 30 days or as directed by DME. Wash your tubing with warm soap and water daily, hang to dry. Wash humidifier portion weekly.  Be aware of reduced alertness and do not drive or operate heavy machinery if experiencing this or drowsiness.  Notify if persistent daytime sleepiness occurs even with consistent use of CPAP.  We will repeat your sleep study. If you still have evidence of sleep apnea, we will be able to get you a new CPAP machine. Someone will contact you to schedule this study   We discussed how untreated sleep apnea puts an individual at risk for cardiac arrhthymias, pulm HTN, DM, stroke and increases their risk for daytime accidents. We also briefly reviewed treatment options including weight loss, side sleeping position, oral appliance,  CPAP therapy or referral to ENT for possible surgical options  Use caution when driving and pull over if you become sleepy.  Follow up in 6 weeks with Katie Tanai Bouler,NP to go over sleep study results, or sooner, if needed

## 2022-09-22 ENCOUNTER — Ambulatory Visit (INDEPENDENT_AMBULATORY_CARE_PROVIDER_SITE_OTHER): Payer: Medicare Other | Admitting: Orthopaedic Surgery

## 2022-09-22 ENCOUNTER — Encounter: Payer: Self-pay | Admitting: Orthopaedic Surgery

## 2022-09-22 ENCOUNTER — Other Ambulatory Visit: Payer: Self-pay

## 2022-09-22 DIAGNOSIS — Z96641 Presence of right artificial hip joint: Secondary | ICD-10-CM

## 2022-09-22 DIAGNOSIS — M25551 Pain in right hip: Secondary | ICD-10-CM

## 2022-09-22 NOTE — Progress Notes (Signed)
The patient is a 67 year old gentleman well-known to me.  We replaced his right hip secondary to arthritis back in June 2021.  He had also had an open inguinal hernia surgery in May 2020 and he still continues to have groin pain.  I believe he is seen another general surgeon for second opinion who does not feel that his groin pain is related to previous hernia surgery.  On exam his right hip moves smoothly and fluidly and he continues to have pain in the groin.  He is now getting close to 3 years out from his hip replacement.  Previous x-rays have shown no significant findings with the hip prosthesis.  I have sent him for an intra-articular injection and an injection around psoas tendon.  This was done under fluoroscopy one of our partners and he says it really did not really help him much at all.  He is on blood thinning medication secondary to blood clot.  At this point I would like to send him for an MRI of the right hip to rule out any type of pathology from his groin pain and the prosthesis.  Also would like to order a three-phase bone scan to rule out loosening of the acetabular component.  We will see him back once we have the studies.  He agrees with this treatment plan.

## 2022-09-24 DIAGNOSIS — M170 Bilateral primary osteoarthritis of knee: Secondary | ICD-10-CM | POA: Diagnosis not present

## 2022-10-06 ENCOUNTER — Telehealth: Payer: Self-pay | Admitting: Orthopaedic Surgery

## 2022-10-06 NOTE — Telephone Encounter (Signed)
Patient states that Dr. Magnus Ivan told home he was ordering X-rays in groin area and no one has called him to let him know where and when. Please advise

## 2022-10-13 DIAGNOSIS — C61 Malignant neoplasm of prostate: Secondary | ICD-10-CM | POA: Diagnosis not present

## 2022-10-13 DIAGNOSIS — Z7982 Long term (current) use of aspirin: Secondary | ICD-10-CM | POA: Diagnosis not present

## 2022-10-13 DIAGNOSIS — N410 Acute prostatitis: Secondary | ICD-10-CM | POA: Diagnosis not present

## 2022-10-13 DIAGNOSIS — Z79890 Hormone replacement therapy (postmenopausal): Secondary | ICD-10-CM | POA: Diagnosis not present

## 2022-10-13 DIAGNOSIS — Z91030 Bee allergy status: Secondary | ICD-10-CM | POA: Diagnosis not present

## 2022-10-13 DIAGNOSIS — Z79899 Other long term (current) drug therapy: Secondary | ICD-10-CM | POA: Diagnosis not present

## 2022-10-13 DIAGNOSIS — N4289 Other specified disorders of prostate: Secondary | ICD-10-CM | POA: Diagnosis not present

## 2022-10-13 DIAGNOSIS — Z888 Allergy status to other drugs, medicaments and biological substances status: Secondary | ICD-10-CM | POA: Diagnosis not present

## 2022-10-13 DIAGNOSIS — R972 Elevated prostate specific antigen [PSA]: Secondary | ICD-10-CM | POA: Diagnosis not present

## 2022-10-13 NOTE — Telephone Encounter (Signed)
Resent referral to [facility] for Bone scan they will contact pt to schedule appt

## 2022-10-21 DIAGNOSIS — N281 Cyst of kidney, acquired: Secondary | ICD-10-CM | POA: Diagnosis not present

## 2022-10-21 DIAGNOSIS — N200 Calculus of kidney: Secondary | ICD-10-CM | POA: Diagnosis not present

## 2022-10-21 DIAGNOSIS — C672 Malignant neoplasm of lateral wall of bladder: Secondary | ICD-10-CM | POA: Diagnosis not present

## 2022-10-26 ENCOUNTER — Encounter (HOSPITAL_COMMUNITY)
Admission: RE | Admit: 2022-10-26 | Discharge: 2022-10-26 | Disposition: A | Discharge status: Home / Self Care | Payer: Medicare Other | Source: Ambulatory Visit | Attending: Orthopaedic Surgery | Admitting: Orthopaedic Surgery

## 2022-10-26 ENCOUNTER — Ambulatory Visit: Payer: Medicare Other

## 2022-10-26 ENCOUNTER — Ambulatory Visit (HOSPITAL_COMMUNITY)
Admission: RE | Admit: 2022-10-26 | Discharge: 2022-10-26 | Disposition: A | Discharge status: Home / Self Care | Payer: Medicare Other | Source: Ambulatory Visit | Attending: Orthopaedic Surgery | Admitting: Orthopaedic Surgery

## 2022-10-26 DIAGNOSIS — M25551 Pain in right hip: Secondary | ICD-10-CM | POA: Insufficient documentation

## 2022-10-26 DIAGNOSIS — Z96641 Presence of right artificial hip joint: Secondary | ICD-10-CM | POA: Diagnosis not present

## 2022-10-26 DIAGNOSIS — G4733 Obstructive sleep apnea (adult) (pediatric): Secondary | ICD-10-CM | POA: Diagnosis not present

## 2022-10-26 MED ORDER — TECHNETIUM TC 99M MEDRONATE IV KIT
20.0000 | PACK | Freq: Once | INTRAVENOUS | Status: AC | PRN
  Administered 2022-10-26: 20 via INTRAVENOUS

## 2022-10-26 MED ORDER — TECHNETIUM TC 99M MEDRONATE IV KIT: 20.0000 | PACK | Freq: Once | INTRAVENOUS | Status: DC | PRN

## 2022-10-27 ENCOUNTER — Ambulatory Visit: Payer: Medicare Other | Admitting: Nurse Practitioner

## 2022-10-28 DIAGNOSIS — L738 Other specified follicular disorders: Secondary | ICD-10-CM | POA: Diagnosis not present

## 2022-10-28 DIAGNOSIS — Z85828 Personal history of other malignant neoplasm of skin: Secondary | ICD-10-CM | POA: Diagnosis not present

## 2022-10-28 DIAGNOSIS — B351 Tinea unguium: Secondary | ICD-10-CM | POA: Diagnosis not present

## 2022-10-28 DIAGNOSIS — L821 Other seborrheic keratosis: Secondary | ICD-10-CM | POA: Diagnosis not present

## 2022-10-28 DIAGNOSIS — L570 Actinic keratosis: Secondary | ICD-10-CM | POA: Diagnosis not present

## 2022-10-29 DIAGNOSIS — G4733 Obstructive sleep apnea (adult) (pediatric): Secondary | ICD-10-CM | POA: Diagnosis not present

## 2022-11-05 ENCOUNTER — Ambulatory Visit
Admission: RE | Admit: 2022-11-05 | Discharge: 2022-11-05 | Disposition: A | Discharge status: Home / Self Care | Payer: Medicare Other | Source: Ambulatory Visit | Attending: Orthopaedic Surgery | Admitting: Orthopaedic Surgery

## 2022-11-05 DIAGNOSIS — Z96641 Presence of right artificial hip joint: Secondary | ICD-10-CM

## 2022-11-05 DIAGNOSIS — M25551 Pain in right hip: Secondary | ICD-10-CM

## 2022-11-10 ENCOUNTER — Encounter: Payer: Self-pay | Admitting: Orthopaedic Surgery

## 2022-11-10 ENCOUNTER — Ambulatory Visit: Payer: Medicare Other | Admitting: Orthopaedic Surgery

## 2022-11-10 DIAGNOSIS — Z96641 Presence of right artificial hip joint: Secondary | ICD-10-CM

## 2022-11-10 NOTE — Progress Notes (Signed)
The patient is very well-known to me.  Phillip Hester is a 67 year old gentleman who is now 3 years out from a right direct anterior total hip arthroplasty.  He did have osteoarthritis of the right hip with also labral tearing.  He has had previous hernia surgery on that right side and he said urologic type of surgery as well as a relates to prostate.  He continues to have significant right groin pain and it has been bothersome for him.  I did send him to my partner Dr. Shon Baton who did provide a steroid injection in the iliopsoas tendon area.  The patient unfortunate has had a blood clot since then.  He continues to have groin pain.  I have sent him for a three-phase bone scan to rule out prosthetic loosening and a MRI to assess into the structures around the right hip that may relate to the right groin pain that he continues to experience.  He did let me know that he did a lot of hard yard work on Monday and he had significant right hip pain on the lateral aspect after that but that is calming down.  On exam his right hip moves smoothly and fluidly with no blocks to rotation and no instability on my exam.  The three-phase bone scan was negative for prosthetic loosening.  The MRI of his right hip did not show any pathology around the right hip.  At this standpoint I would like to send him to one of my colleagues in town Dr. Trudee Grip with [facility] to see if there is anything that I am missing as it relates to his right hip.  I would also like him to bring this up with his urologist that he sees in June.  Finally, I would like to also send him to Dr. Axel Filler with [facility] surgery for evaluation of the previous hernia surgery that he had on the right side to see if there is anything related to the pain that he continues to experience.  The patient does agree with all these referrals so we can try to get to the bottom of why he still hurts in the groin.

## 2022-11-11 ENCOUNTER — Encounter: Payer: Self-pay | Admitting: Nurse Practitioner

## 2022-11-11 ENCOUNTER — Ambulatory Visit (INDEPENDENT_AMBULATORY_CARE_PROVIDER_SITE_OTHER): Payer: Medicare Other | Admitting: Nurse Practitioner

## 2022-11-11 VITALS — BP 140/84 | HR 72 | Temp 98.3°F | Ht 75.0 in | Wt 251.0 lb

## 2022-11-11 DIAGNOSIS — E669 Obesity, unspecified: Secondary | ICD-10-CM | POA: Insufficient documentation

## 2022-11-11 DIAGNOSIS — G4733 Obstructive sleep apnea (adult) (pediatric): Secondary | ICD-10-CM

## 2022-11-11 NOTE — Progress Notes (Signed)
@Patient  ID: Phillip Hester, male    DOB: 03/10/1956, 67 y.o.   MRN: [id number]  No chief complaint on file.   Referring provider: Chari Hester  HPI: 67 year old male, never smoker referred for sleep consult.  Past medical history significant for history of PE on Xarelto, OSA on CPAP, bladder cancer, HLD, depression, ED, vitamin B12 deficiency, obesity.  TEST/EVENTS:  11/2008 PSG: AHI 24/h, supine AHI was 59/h  09/14/2022: OV with Phillip Lasseigne NP for sleep consult.  He was previously followed by Phillip Hester and last seen in office 2015.  Since then he has not had anyone who has routinely monitored his sleep apnea or CPAP machine.  He was at an appointment with his PCP and was discussing his increasing fatigue symptoms over the last couple of months.  He is waking up feeling less rested and more tired in the afternoons.  They recommended that he have a sleep consult determine how well his CPAP is controlling him currently.  His machine is very old, close to 10 years.  He did bring an SD card with him today but there was no data on it.  He does tell me that he sleeps with his CPAP every night.  Previously helped him feel better during the day.  He does feel tired when he is driving but has not fallen asleep while driving in many years.  Denies any morning headaches, sleep parasomnia/paralysis, breakthrough snoring.  No history of narcolepsy or symptoms of cataplexy. He goes to bed between 930 to 10 PM.  Falls asleep within 10 to 15 minutes.  Takes trazodone for sleep aid.  Wakes 2-3 times a night.  Officially gets up around 6 to 6:30 AM.  Does not operate heavy machinery in his job field.  He is 25 pounds up over the last 2 years.  His last sleep study was 20 years ago.  He believes his CPAP is set at 10 cm of water.  He currently wears a nasal pillow mask. No history of hypertension, diabetes or stroke.  He is a never smoker.  Drinks 5-7 alcoholic beverages a week.  No excessive caffeine intake.  He  is self-employed and owns his own business.  Lives with his dog.  No significant family history. [place] 13  11/11/2022: Today - follow up Patient presents today for follow up after undergoing repeat sleep study. He still has moderate sleep apnea. He would like to continue on CPAP therapy - needs a new machine. He feels like his current machine does not give him enough pressure. He's feeling like he's not resting as well with it and is more tired during the day. He's not sure what setting his machine is on. No SD card available. Wants to go back with a nasal pillow mask. No worsening symptoms since his last visit.   Allergies  Allergen Reactions   Bee Venom Anaphylaxis     There is no immunization history on file for this patient.  Past Medical History:  Diagnosis Date   Arthritis    knees   Cancer (HCC)    bladder cancer   DVT (deep venous thrombosis) (HCC)    GERD (gastroesophageal reflux disease)    Heart murmur    diagnosed with "athlete's murmur" at 17   History of kidney stones    Hyperlipidemia    Pure hypercholesterolemia    Sleep apnea    CPAP at night    Tobacco History: Social History   Tobacco Use  Smoking Status Never  Smokeless Tobacco Never   Counseling given: Not Answered   Outpatient Medications Prior to Visit  Medication Sig Dispense Refill   albuterol (VENTOLIN HFA) 108 (90 Base) MCG/ACT inhaler Inhale 2 puffs into the lungs every 4 (four) hours as needed. (Patient not taking: Reported on 09/14/2022)     amoxicillin (AMOXIL) 500 MG tablet Take 250-500 mg by mouth daily as needed. (Patient not taking: Reported on 09/14/2022)     Ascorbic Acid (VITAMIN C) 1000 MG tablet Take 3,000 mg by mouth daily.      buPROPion (WELLBUTRIN XL) 300 MG 24 hr tablet Take 1 tablet by mouth daily.     Cholecalciferol (VITAMIN D3) 50 MCG (2000 UT) capsule Take 6,000 Units by mouth daily.     Cyanocobalamin 1000 MCG TBCR Take by mouth.     hydrocortisone (CORTEF) 5 MG tablet  Take 5 mg by mouth every morning.     MAGNESIUM PO Take by mouth.     melatonin 3 MG TABS tablet Take 3 mg by mouth at bedtime as needed.     Omega-3-6-9 CAPS Take 1 capsule by mouth daily. 1300 mg (Patient not taking: Reported on 09/14/2022)     rivaroxaban (XARELTO) 20 MG TABS tablet Take 1 tablet (20 mg total) by mouth daily with supper. 30 tablet 6   tamsulosin (FLOMAX) 0.4 MG CAPS capsule Take 0.4 mg by mouth daily.     testosterone cypionate (DEPOTESTOTERONE CYPIONATE) 100 MG/ML injection Inject into the muscle once a week. For IM use only     traZODone (DESYREL) 100 MG tablet Take 100 mg by mouth at bedtime as needed for sleep.     zinc gluconate 50 MG tablet Take 100 mg by mouth daily.     Facility-Administered Medications Prior to Visit  Medication Dose Route Frequency Provider Last Rate Last Admin   betamethasone acetate-betamethasone sodium phosphate (CELESTONE) injection 3 mg  3 mg Intra-articular Once Phillip Hester, DPM         Review of Systems:   Constitutional: No night sweats, fevers, chills,or lassitude. +fatigue, weight gain HEENT: No headaches, difficulty swallowing, tooth/dental problems, or sore throat. No sneezing, itching, ear ache, nasal congestion, or post nasal drip CV:  No chest pain, orthopnea, PND, swelling in lower extremities, anasarca, dizziness, palpitations, syncope Resp: No shortness of breath with exertion or at rest. No excess mucus or change in color of mucus. No productive or non-productive. No hemoptysis. No wheezing.  No chest wall deformity GI:  +heartburn, indigestion. No abdominal pain, nausea, vomiting, diarrhea, change in bowel habits, loss of appetite, bloody stools.  GU: No dysuria, change in color of urine, urgency or frequency.   Skin: No rash, lesions, ulcerations MSK:  No joint pain or swelling.   Neuro: No dizziness or lightheadedness.  Psych: +depression, anxiety (stable). Mood stable. +sleep disturbance    Physical Exam:  BP (!)  140/84   Pulse 72   Temp 98.3 F (36.8 C) (Oral)   Ht 6\' 3"  (1.905 m)   Wt 251 lb (113.9 kg)   SpO2 98%   BMI 31.37 kg/m   GEN: Pleasant, interactive, well-appearing; obese; in no acute distress. HEENT:  Normocephalic and atraumatic. PERRLA. Sclera white. Nasal turbinates pink, moist and patent bilaterally. No rhinorrhea present. Oropharynx pink and moist, without exudate or edema. No lesions, ulcerations, or postnasal drip. Mallampati III NECK:  Supple w/ fair ROM. No JVD present. Normal carotid impulses w/o bruits. Thyroid symmetrical with no goiter or nodules palpated. No lymphadenopathy.  CV: RRR, no m/r/g, no peripheral edema. Pulses intact, +2 bilaterally. No cyanosis, pallor or clubbing. PULMONARY:  Unlabored, regular breathing. Clear bilaterally A&P w/o wheezes/rales/rhonchi. No accessory muscle use.  GI: BS present and normoactive. Soft, non-tender to palpation. No organomegaly or masses detected.  MSK: No erythema, warmth or tenderness. Cap refil <2 sec all extrem. No deformities or joint swelling noted.  Neuro: A/Ox3. No focal deficits noted.   Skin: Warm, no lesions or rashe Psych: Normal affect and behavior. Judgement and thought content appropriate.     Lab Results:  CBC    Component Value Date/Time   WBC 8.2 11/28/2019 0456   RBC 4.64 11/28/2019 0456   HGB 18.0 (H) 03/23/2022 1303   HCT 53.0 (H) 03/23/2022 1303   PLT 302 11/28/2019 0456   MCV 91.8 11/28/2019 0456   MCH 30.2 11/28/2019 0456   MCHC 32.9 11/28/2019 0456   RDW 13.8 11/28/2019 0456    BMET    Component Value Date/Time   NA 141 03/23/2022 1303   K 3.8 03/23/2022 1303   CL 107 03/23/2022 1303   CO2 26 11/28/2019 0456   GLUCOSE 80 03/23/2022 1303   BUN 19 03/23/2022 1303   CREATININE 1.00 03/23/2022 1303   CALCIUM 8.4 (L) 11/28/2019 0456   GFRNONAA >60 11/28/2019 0456   GFRAA >60 11/28/2019 0456    BNP No results found for: "BNP"   Imaging:  MR Hip Right w/o contrast  Result Date:  11/09/2022 CLINICAL DATA:  Right groin pain. History of total hip replacement 2.5 years ago. EXAM: MR OF THE RIGHT HIP WITHOUT CONTRAST TECHNIQUE: Multiplanar, multisequence MR imaging was performed. No intravenous contrast was administered. COMPARISON:  Radiographs 03/01/2022.  Preoperative MRI 09/24/2019 FINDINGS: Bones: Metal artifact from right total hip prosthesis. No significant vertebral marrow edema is identified. Degenerative endplate findings and prominent loss of disc height at the L5-S1 level. Although only partially included, there seems to be accentuated T1 and low T2 signal in the peripheral zone of the prostate gland on the left Articular cartilage and labrum Articular cartilage:  N/A Labrum:  N/A Joint or bursal effusion Joint effusion:  Absent Bursae: No regional bursitis Muscles and tendons Muscles and tendons: Mild relative atrophy of the right piriformis muscle compared to the left. No regional muscular edema. Hamstring tendons unremarkable. No large or well-defined tear along the hip adductor musculature or abductor aponeurosis. Other findings Miscellaneous: No impinging lesion along the sciatic notch. Proximal sciatic nerve unremarkable. Accentuated T1 and low T2 signal in the left posterior prostate gland noted, compatible with blood products. Review of the patient's outside medical record indicates a prostate biopsy on 10/13/2022 in the appearance is compatible with normal findings 23 days after prostate biopsy. IMPRESSION: 1. No specific internal derangement to explain the patient's right groin pain. 2. Substantial degenerative disc disease and loss of disc height at L5-S1, only partially characterized today. 3. Right total hip prosthesis with metal artifact. 4. Degenerative disc disease and degenerative endplate findings at L5-S1. 5. Mild relative atrophy of the right piriformis muscle compared to the left. No regional muscular edema or large or well-defined tear along the hip adductor  musculature or adductor aponeurosis. 6. Blood products primarily in the left peripheral zone of the prostate gland compatible with biopsy earlier this month. Electronically Signed   By: Gaylyn Rong M.D.   On: 11/09/2022 17:27   NM Bone Scan 3 Phase  Result Date: 10/29/2022 CLINICAL DATA:  Concern for loosening of the RIGHT hip prosthetic.  EXAM: NUCLEAR MEDICINE 3-PHASE BONE SCAN TECHNIQUE: Radionuclide angiographic images, immediate static blood pool images, and 3-hour delayed static images were obtained of the hips after intravenous injection of radiopharmaceutical. RADIOPHARMACEUTICALS:  21.6 mCi Tc-66m MDP IV COMPARISON:  Plain film 03/01/2022 FINDINGS: Vascular phase: No asymmetric or increased blood flow to the LEFT or RIGHT . Blood pool phase: No asymmetric or increased blood pool activity. Delayed phase: No asymmetric or increased delayed activity in LEFT or RIGHT hip. Photopenia noted associated with the RIGHT femoral head prosthetic. IMPRESSION: No evidence of loosening or  infection RIGHT hip prosthetic. Electronically Signed   By: Genevive Bi M.D.   On: 10/29/2022 15:15          No data to display          No results found for: "NITRICOXIDE"      Assessment & Plan:   OSA on CPAP Moderate OSA without significant change compared to 2010. He's on CPAP therapy and is open to continuing. He's unsure if his CPAP settings are not set appropriately or if his machine is just old. We will send orders for new CPAP 5-15 cmH2O and re-evaluate control at follow up. Hopefully, this will improve sleep quality and daytime fatigue symptoms. He understands proper care/use of device. Aware of safe driving practices.   Patient Instructions  Continue to use CPAP every night, minimum of 4-6 hours a night.  Change equipment every 30 days or as directed by DME. Wash your tubing with warm soap and water daily, hang to dry. Wash humidifier portion weekly. Use distilled bottle water  Be aware  of reduced alertness and do not drive or operate heavy machinery if experiencing this or drowsiness.  Notify if persistent daytime sleepiness occurs even with consistent use of CPAP.  -Every month Mask cushions and/or nasal pillows CPAP machine filters -Every 3 months Mask frame (not including the headgear) CPAP tubing -Every 6 months Mask headgear Chin strap (if applicable) Humidifier water tub  Orders placed for new auto CPAP machine 5-15 cmH2O, nasal pillow mask through [facility]  Follow up in 8-12 weeks with Phillip Hester or Philis Nettle, or sooner, if needed    Obesity (BMI 30.0-34.9) BMI 31. Healthy weight loss encouraged.   I spent 35 minutes of dedicated to the care of this patient on the date of this encounter to include pre-visit review of records, face-to-face time with the patient discussing conditions above, post visit ordering of testing, clinical documentation with the electronic health record, making appropriate referrals as documented, and communicating necessary findings to members of the patients care team.  Noemi Chapel, NP 11/11/2022  Pt aware and understands NP's role.

## 2022-11-11 NOTE — Assessment & Plan Note (Signed)
BMI 31. Healthy weight loss encouraged.  

## 2022-11-11 NOTE — Assessment & Plan Note (Signed)
Moderate OSA without significant change compared to 2010. He's on CPAP therapy and is open to continuing. He's unsure if his CPAP settings are not set appropriately or if his machine is just old. We will send orders for new CPAP 5-15 cmH2O and re-evaluate control at follow up. Hopefully, this will improve sleep quality and daytime fatigue symptoms. He understands proper care/use of device. Aware of safe driving practices.   Patient Instructions  Continue to use CPAP every night, minimum of 4-6 hours a night.  Change equipment every 30 days or as directed by DME. Wash your tubing with warm soap and water daily, hang to dry. Wash humidifier portion weekly. Use distilled bottle water  Be aware of reduced alertness and do not drive or operate heavy machinery if experiencing this or drowsiness.  Notify if persistent daytime sleepiness occurs even with consistent use of CPAP.  -Every month Mask cushions and/or nasal pillows CPAP machine filters -Every 3 months Mask frame (not including the headgear) CPAP tubing -Every 6 months Mask headgear Chin strap (if applicable) Humidifier water tub  Orders placed for new auto CPAP machine 5-15 cmH2O, nasal pillow mask through [facility]  Follow up in 8-12 weeks with Dr. Vassie Loll or Philis Nettle, or sooner, if needed

## 2022-11-11 NOTE — Patient Instructions (Addendum)
Continue to use CPAP every night, minimum of 4-6 hours a night.  Change equipment every 30 days or as directed by DME. Wash your tubing with warm soap and water daily, hang to dry. Wash humidifier portion weekly. Use distilled bottle water  Be aware of reduced alertness and do not drive or operate heavy machinery if experiencing this or drowsiness.  Notify if persistent daytime sleepiness occurs even with consistent use of CPAP.  -Every month Mask cushions and/or nasal pillows CPAP machine filters -Every 3 months Mask frame (not including the headgear) CPAP tubing -Every 6 months Mask headgear Chin strap (if applicable) Humidifier water tub  Orders placed for new auto CPAP machine 5-15 cmH2O, nasal pillow mask through [facility]  Follow up in 8-12 weeks with Dr. Vassie Loll or Philis Nettle, or sooner, if needed

## 2022-11-12 ENCOUNTER — Other Ambulatory Visit: Payer: Self-pay

## 2022-11-12 DIAGNOSIS — R1031 Right lower quadrant pain: Secondary | ICD-10-CM

## 2022-11-12 DIAGNOSIS — M25551 Pain in right hip: Secondary | ICD-10-CM

## 2022-11-12 DIAGNOSIS — Z96641 Presence of right artificial hip joint: Secondary | ICD-10-CM

## 2022-11-25 DIAGNOSIS — E063 Autoimmune thyroiditis: Secondary | ICD-10-CM | POA: Diagnosis not present

## 2022-11-25 DIAGNOSIS — D509 Iron deficiency anemia, unspecified: Secondary | ICD-10-CM | POA: Diagnosis not present

## 2022-11-25 DIAGNOSIS — R972 Elevated prostate specific antigen [PSA]: Secondary | ICD-10-CM | POA: Diagnosis not present

## 2022-11-25 DIAGNOSIS — E291 Testicular hypofunction: Secondary | ICD-10-CM | POA: Diagnosis not present

## 2022-11-25 DIAGNOSIS — N400 Enlarged prostate without lower urinary tract symptoms: Secondary | ICD-10-CM | POA: Diagnosis not present

## 2022-11-25 DIAGNOSIS — G4733 Obstructive sleep apnea (adult) (pediatric): Secondary | ICD-10-CM | POA: Diagnosis not present

## 2022-11-25 DIAGNOSIS — R7309 Other abnormal glucose: Secondary | ICD-10-CM | POA: Diagnosis not present

## 2022-11-25 DIAGNOSIS — E782 Mixed hyperlipidemia: Secondary | ICD-10-CM | POA: Diagnosis not present

## 2022-11-25 DIAGNOSIS — E559 Vitamin D deficiency, unspecified: Secondary | ICD-10-CM | POA: Diagnosis not present

## 2022-11-29 ENCOUNTER — Ambulatory Visit (INDEPENDENT_AMBULATORY_CARE_PROVIDER_SITE_OTHER): Payer: Medicare Other | Admitting: Surgery

## 2022-11-29 ENCOUNTER — Ambulatory Visit (INDEPENDENT_AMBULATORY_CARE_PROVIDER_SITE_OTHER)
Admission: RE | Admit: 2022-11-29 | Discharge: 2022-11-29 | Disposition: A | Discharge status: Home / Self Care | Payer: Medicare Other | Source: Ambulatory Visit | Attending: Surgery | Admitting: Surgery

## 2022-11-29 ENCOUNTER — Encounter: Payer: Self-pay | Admitting: Surgery

## 2022-11-29 ENCOUNTER — Ambulatory Visit (HOSPITAL_COMMUNITY)
Admission: RE | Admit: 2022-11-29 | Discharge: 2022-11-29 | Disposition: A | Discharge status: Home / Self Care | Payer: Medicare Other | Source: Ambulatory Visit | Attending: Surgery | Admitting: Surgery

## 2022-11-29 VITALS — BP 144/82 | HR 72 | Temp 97.8°F | Resp 16 | Ht 75.0 in | Wt 246.0 lb

## 2022-11-29 DIAGNOSIS — I82411 Acute embolism and thrombosis of right femoral vein: Secondary | ICD-10-CM

## 2022-11-29 DIAGNOSIS — E291 Testicular hypofunction: Secondary | ICD-10-CM | POA: Diagnosis not present

## 2022-11-29 DIAGNOSIS — M7989 Other specified soft tissue disorders: Secondary | ICD-10-CM | POA: Diagnosis not present

## 2022-11-29 DIAGNOSIS — N281 Cyst of kidney, acquired: Secondary | ICD-10-CM | POA: Diagnosis not present

## 2022-11-29 NOTE — Progress Notes (Signed)
[facility]  [name] MRN: [id number] DOB: [birth year] Sex: male   REASON FOR VISIT:    Follow up  HISOTRY OF PRESENT ILLNESS:    Phillip Hester is a 67 y.o. male saw and and October 2023 for a DVT within the right common femoral vein, femoral vein, popliteal vein, and posterior tibial vein.  On 03/23/2022, he underwent successful mechanical thrombectomy and venoplasty.  This was his second DVT.  He had previously had a DVT and PE following hip replacement surgery.  This was treated with Eliquis which has been discontinued.  This time, he had recently had an iliopsoas injection.  His risk factors for testosterone supplementation.  He is seeing hematology.  6 months of anticoagulation were recommended as long as he can cut back on his testosterone.  He has no complaints today.  He denies any claudication symptoms.  He denies leg swelling.  The patient has a history of bladder cancer.  He is a current smoker.   PAST MEDICAL HISTORY:   Past Medical History:  Diagnosis Date   Arthritis    knees   Cancer (HCC)    bladder cancer   DVT (deep venous thrombosis) (HCC)    GERD (gastroesophageal reflux disease)    Heart murmur    diagnosed with "athlete's murmur" at 17   History of kidney stones    Hyperlipidemia    Pure hypercholesterolemia    Sleep apnea    CPAP at night     FAMILY HISTORY:   Family History  Problem Relation Age of Onset   Arthritis Mother    Dementia Father    Cancer Maternal Grandfather    Cancer Paternal Grandfather     SOCIAL HISTORY:   Social History   Tobacco Use   Smoking status: Never   Smokeless tobacco: Never  Substance Use Topics   Alcohol use: Yes    Alcohol/week: 2.0 - 3.0 standard drinks of alcohol    Types: 2 - 3 Standard drinks or equivalent per week    Comment: 2-3 drinks weekly     ALLERGIES:   Allergies  Allergen Reactions   Bee  Venom Anaphylaxis     CURRENT MEDICATIONS:   Current Outpatient Medications  Medication Sig Dispense Refill   Ascorbic Acid (VITAMIN C) 1000 MG tablet Take 3,000 mg by mouth daily.      buPROPion (WELLBUTRIN XL) 300 MG 24 hr tablet Take 1 tablet by mouth daily.     Cholecalciferol (VITAMIN D3) 50 MCG (2000 UT) capsule Take 6,000 Units by mouth daily.     Cyanocobalamin 1000 MCG TBCR Take by mouth.     hydrocortisone (CORTEF) 5 MG tablet Take 5 mg by mouth every morning.     MAGNESIUM PO Take by mouth.     melatonin 3 MG TABS tablet Take 3 mg by mouth at bedtime as needed.     Omega-3-6-9 CAPS Take 1 capsule by mouth daily. 1300 mg     rivaroxaban (XARELTO) 20 MG TABS tablet Take 1 tablet (20 mg total) by mouth daily with supper. 30 tablet 6   tamsulosin (FLOMAX) 0.4 MG CAPS capsule Take 0.4 mg by mouth daily.     testosterone cypionate (DEPOTESTOTERONE CYPIONATE) 100 MG/ML injection Inject into the muscle once a week. For IM use only     traZODone (DESYREL) 100 MG tablet Take 100 mg by mouth at bedtime as needed for sleep.     zinc gluconate 50 MG  tablet Take 100 mg by mouth daily.     albuterol (VENTOLIN HFA) 108 (90 Base) MCG/ACT inhaler Inhale 2 puffs into the lungs every 4 (four) hours as needed. (Patient not taking: Reported on 09/14/2022)     amoxicillin (AMOXIL) 500 MG tablet Take 250-500 mg by mouth daily as needed. (Patient not taking: Reported on 09/14/2022)     Current Facility-Administered Medications  Medication Dose Route Frequency Provider Last Rate Last Admin   betamethasone acetate-betamethasone sodium phosphate (CELESTONE) injection 3 mg  3 mg Intra-articular Once Felecia Shelling, DPM        REVIEW OF SYSTEMS:   [X]  denotes positive finding, [ ]  denotes negative finding Cardiac  Comments:  Chest pain or chest pressure:    Shortness of breath upon exertion:    Short of breath when lying flat:    Irregular heart rhythm:        Vascular    Pain in calf, thigh, or hip  brought on by ambulation:    Pain in feet at night that wakes you up from your sleep:     Blood clot in your veins:    Leg swelling:         Pulmonary    Oxygen at home:    Productive cough:     Wheezing:         Neurologic    Sudden weakness in arms or legs:     Sudden numbness in arms or legs:     Sudden onset of difficulty speaking or slurred speech:    Temporary loss of vision in one eye:     Problems with dizziness:         Gastrointestinal    Blood in stool:     Vomited blood:         Genitourinary    Burning when urinating:     Blood in urine:        Psychiatric    Major depression:         Hematologic    Bleeding problems:    Problems with blood clotting too easily:        Skin    Rashes or ulcers:        Constitutional    Fever or chills:      PHYSICAL EXAM:   Vitals:   11/29/22 0957  BP: (!) 144/82  Pulse: 72  Resp: 16  Temp: 97.8 F (36.6 C)  TempSrc: Temporal  SpO2: 95%  Weight: 246 lb (111.6 kg)  Height: 6\' 3"  (1.905 m)    GENERAL: The patient is a well-nourished male, in no acute distress. The vital signs are documented above. CARDIAC: There is a regular rate and rhythm.  VASCULAR: Palpable pedal pulses, no edema PULMONARY: Non-labored respirations MUSCULOSKELETAL: There are no major deformities or cyanosis. NEUROLOGIC: No focal weakness or paresthesias are detected. SKIN: There are no ulcers or rashes noted. PSYCHIATRIC: The patient has a normal affect.  STUDIES:   I have reviewed his ultrasound with the following findings: IVC/Iliac: Suboptimal exam. Patient not NPO.   Patent proximal IVC, proximal commoon iliac vein, and proximal to distal  external iliac vein.    RIGHT:  - There is no evidence of deep vein thrombosis in the lower extremity.  - There is no evidence of superficial venous thrombosis.     MEDICAL ISSUES:   DVT: The patient has been evaluated by hematology.  This is a second DVT.  The first was provoked from a  hip surgery.  The second 1 was either from a iliopsoas injection versus testosterone which had recently been increased.  From my perspective if he can come off of the testosterone I think it would be safe to stop his anticoagulation.  I did state to him that if he needs to have surgery, he probably needs to be covered in the perioperative time with anticoagulation.  The ultimate decision regarding anticoagulation I will defer to hematology which she is scheduled to see in the near future.  He will follow-up with me on an as-needed basis.    Charlena Cross, MD, FACS [facility] [phone] Pager [phone]

## 2022-11-30 ENCOUNTER — Telehealth: Payer: Self-pay | Admitting: Hematology and Oncology

## 2022-11-30 NOTE — Telephone Encounter (Signed)
Spoke with patient confirming upcoming appointment  

## 2022-12-14 IMAGING — DX DG ANKLE COMPLETE 3+V*L*
3 series · 3 of 3 positions shown · non-contrast
Comparison: MRI 05/27/2021.  X-ray images 04/06/2021.

CLINICAL DATA: Left ankle pain x 6 months.  No known injury.

EXAM:
LEFT ANKLE COMPLETE - 3+ VIEW

[ankle ap]
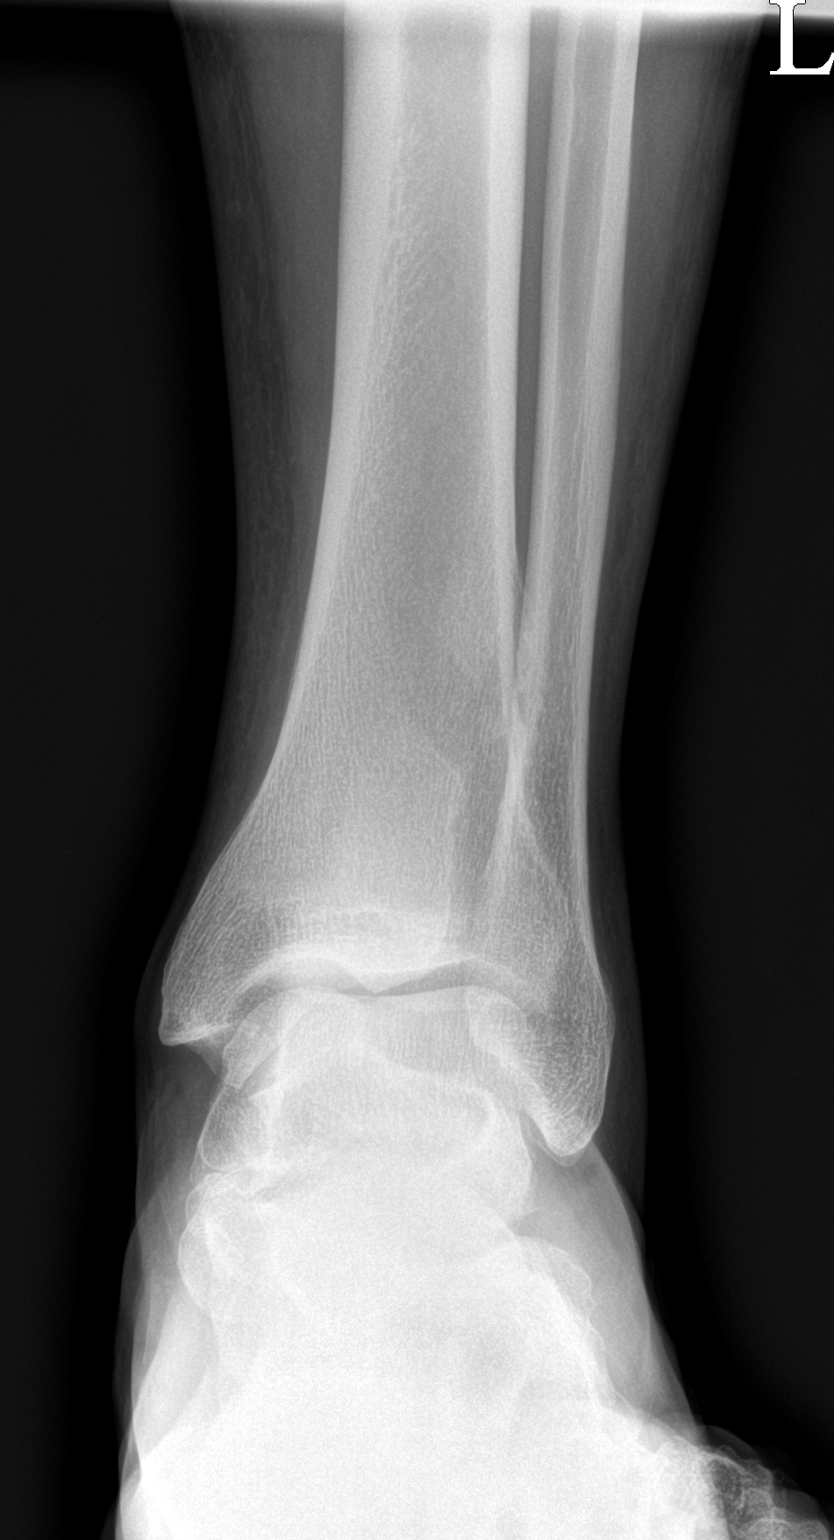

[ankle obl]
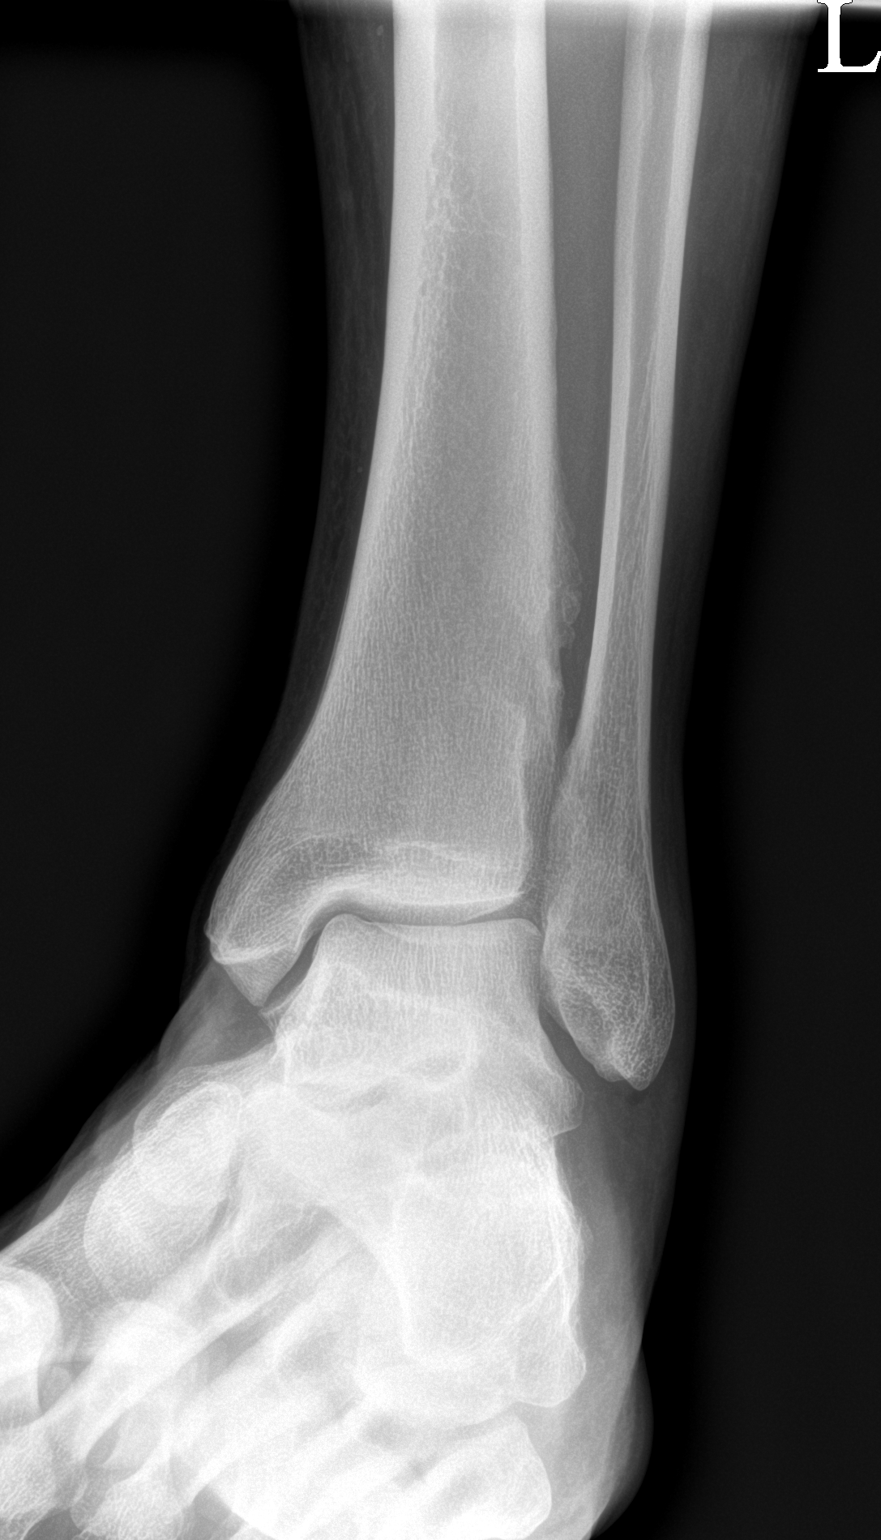

[ankle lat]
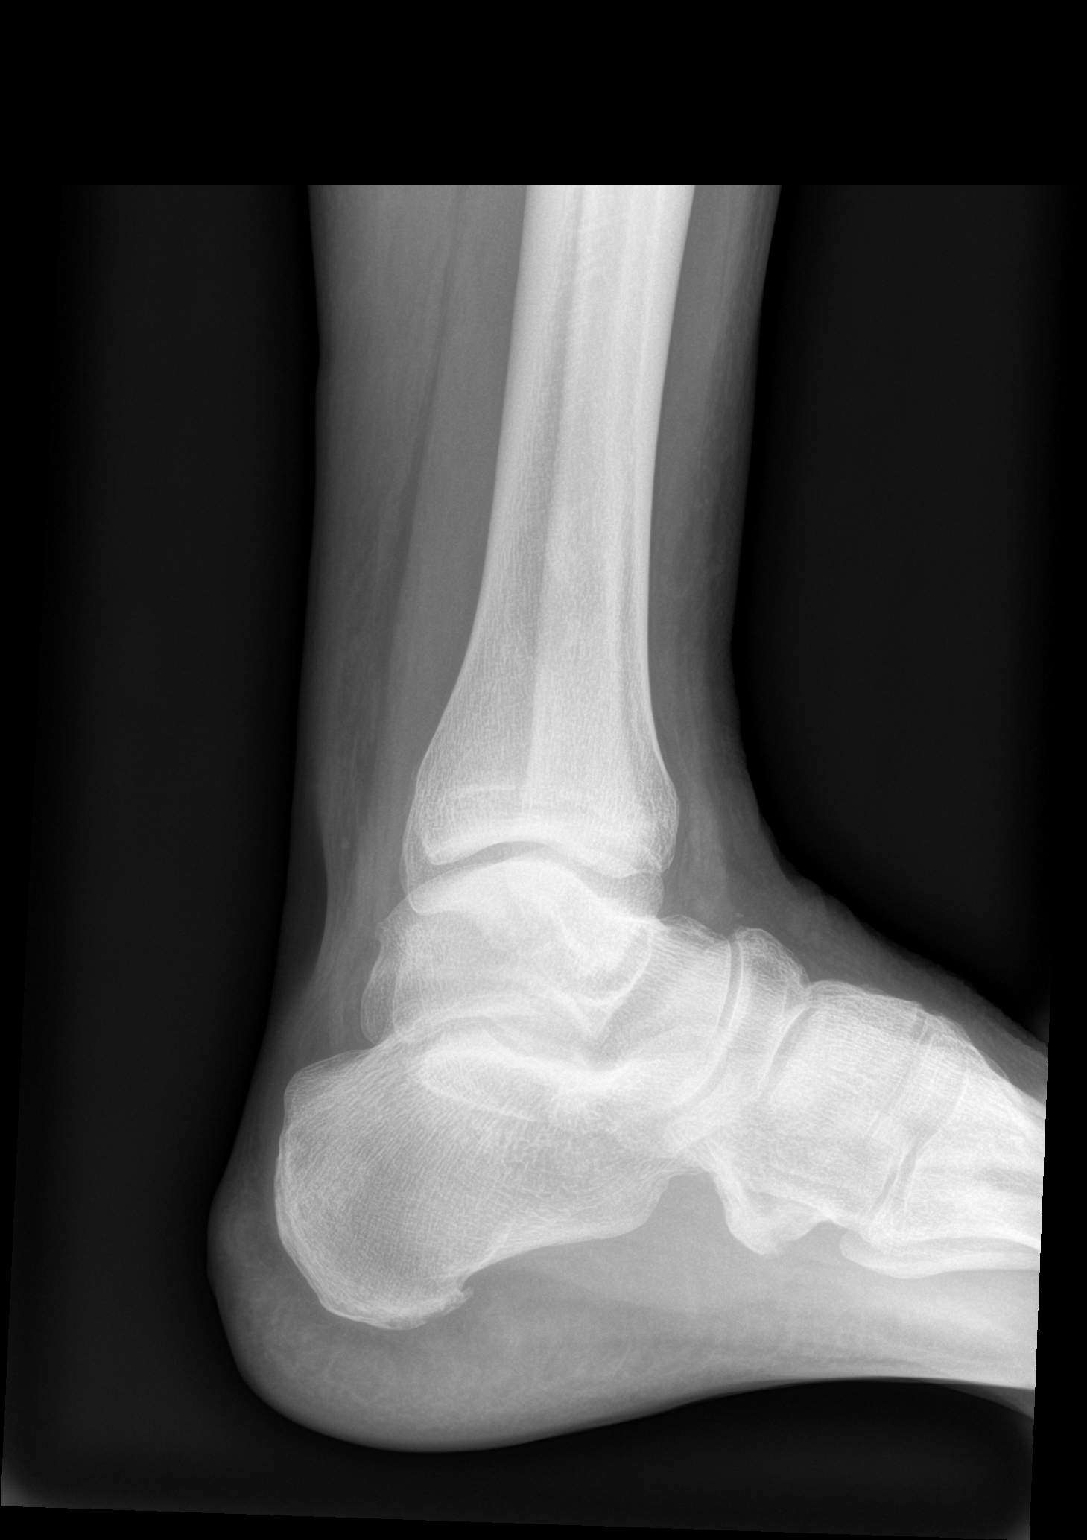

[3 of 3 positions shown; findings below may reference images not displayed]

FINDINGS: Questionable faint rounded lucency noted the distal left tibia.
Adjacent cortical and periosteal thickening noted. These changes may
be related to prior injury. To exclude a focal distal left tibial
lesion MRI of the left ankle/distal tibia is suggested. Malleoli are
intact. Previously noted cuboid fracture not identified. Mild soft
tissue swelling cannot be excluded. No radiopaque foreign body.
IMPRESSION: Questionable faint rounded lucency noted in the distal left tibia.
Adjacent cortical and periosteal thickening noted. These changes may
be related to prior injury. To exclude a focal distal left tibial
lesion MRI of the left ankle/distal tibia suggested. No other focal
bony abnormalities identified. Previously noted cuboid fracture not
identified. Mild soft tissue swelling cannot be excluded.

## 2022-12-23 ENCOUNTER — Inpatient Hospital Stay: Payer: Medicare Other | Attending: Hematology and Oncology | Admitting: Hematology and Oncology

## 2022-12-23 ENCOUNTER — Inpatient Hospital Stay: Payer: Medicare Other

## 2022-12-23 ENCOUNTER — Encounter: Payer: Self-pay | Admitting: Hematology and Oncology

## 2022-12-23 VITALS — BP 139/90 | HR 75 | Temp 98.2°F | Resp 20 | Wt 248.1 lb

## 2022-12-23 DIAGNOSIS — I82411 Acute embolism and thrombosis of right femoral vein: Secondary | ICD-10-CM

## 2022-12-23 DIAGNOSIS — D6862 Lupus anticoagulant syndrome: Secondary | ICD-10-CM | POA: Insufficient documentation

## 2022-12-23 DIAGNOSIS — Z7901 Long term (current) use of anticoagulants: Secondary | ICD-10-CM | POA: Insufficient documentation

## 2022-12-23 DIAGNOSIS — Z86711 Personal history of pulmonary embolism: Secondary | ICD-10-CM | POA: Insufficient documentation

## 2022-12-23 DIAGNOSIS — Z86718 Personal history of other venous thrombosis and embolism: Secondary | ICD-10-CM | POA: Diagnosis not present

## 2022-12-23 NOTE — Progress Notes (Signed)
[facility] CONSULT NOTE  Patient Care Team: Chari Manning, MD as PCP - General (Family Medicine) Rachel Moulds, MD as Consulting Physician (Hematology and Oncology) Rachel Moulds, MD as Consulting Physician (Hematology and Oncology) Rachel Moulds, MD as Consulting Physician (Hematology and Oncology)  CHIEF COMPLAINTS/PURPOSE OF CONSULTATION:  Right lower extremity DVT  ASSESSMENT & PLAN:   This is a 67 year old male patient with past medical history significant for dyslipidemia, obstructive sleep apnea, history of PE after right hip replacement about 2 years ago now diagnosed with right lower extremity DVT status post thrombectomy yesterday on Xarelto referred to hematology for anticoagulation recommendations. His first episode of PE appears to be provoked after hip replacement, had 3 months of anticoagulation approximately. He now completed about 8 months of anticoagulation and stopped taking Xarelto 2 weeks ago.  He has been continuing on testosterone, last dose was about a week ago. Hypercoagulable workup showed abnormal lupus anticoagulant likely from concomitant use of Xarelto hence we will repeat this.  If today's lab work is negative, then it is reasonable to continue on baby aspirin, be aware of symptoms and signs of DVT/PE and if he ends up having a third episode even if it is provoked, I would recommend indefinite anticoagulation.  I have also asked him to completely stop testosterone supplementation if he desires to stay off of anticoagulation.  If he wants to continue testosterone, I would recommend he continue the anticoagulation.   We have discussed symptoms and signs of DVT/PE, risk factors for DVT/PE in detail today. He understands the need to go to the emergency room with any concerns or symptoms or signs concerning for DVT/PE. Age appropriate cancer screening recommended.  HISTORY OF PRESENTING ILLNESS:  Phillip Hester 67 y.o. male is here  because of DVT of right lower extremity.  This is a very pleasant 67 year old male patient with past medical history significant for PE 2 years ago after hip replacement, DVT of the right lower extremity most recently currently on Xarelto starter pack referred to hematology for additional recommendations.  He completed xarelto about 2 weeks ago.  He said he met the vascular surgeon and they are comfortable with him discontinuing anticoagulation. Last testosterone shot was about a week ago. No more lower extremity swelling.  No cough, chest pain or shortness of breath. Rest of the pertinent 10 point ROS reviewed and negative   MEDICAL HISTORY:  Past Medical History:  Diagnosis Date   Arthritis    knees   Cancer (HCC)    bladder cancer   DVT (deep venous thrombosis) (HCC)    GERD (gastroesophageal reflux disease)    Heart murmur    diagnosed with "athlete's murmur" at 17   History of kidney stones    Hyperlipidemia    Pure hypercholesterolemia    Sleep apnea    CPAP at night    SURGICAL HISTORY: Past Surgical History:  Procedure Laterality Date   INGUINAL HERNIA REPAIR     IRRIGATION AND DEBRIDEMENT SEBACEOUS CYST     KNEE SURGERY Left 2013   PERIPHERAL VASCULAR BALLOON ANGIOPLASTY Right 03/23/2022   Procedure: PERIPHERAL VASCULAR BALLOON ANGIOPLASTY;  Surgeon: Nada Libman, MD;  Location: [facility];  Service: Cardiovascular;  Laterality: Right;   PERIPHERAL VASCULAR THROMBECTOMY N/A 03/23/2022   Procedure: PERIPHERAL VASCULAR THROMBECTOMY;  Surgeon: Nada Libman, MD;  Location: [facility];  Service: Cardiovascular;  Laterality: N/A;   SHOULDER SURGERY Right 2013   TOTAL HIP ARTHROPLASTY Right 11/20/2019  Procedure: RIGHT TOTAL HIP ARTHROPLASTY ANTERIOR APPROACH;  Surgeon: Kathryne Hitch, MD;  Location: [facility];  Service: Orthopedics;  Laterality: Right;   WISDOM TOOTH EXTRACTION      SOCIAL HISTORY: Social History   Socioeconomic History    Marital status: Legally Separated    Spouse name: Not on file   Number of children: 2   Years of education: Not on file   Highest education level: Not on file  Occupational History   Occupation: Pharmacologist: [employer]  Tobacco Use   Smoking status: Never   Smokeless tobacco: Never  Vaping Use   Vaping status: Never Used  Substance and Sexual Activity   Alcohol use: Yes    Alcohol/week: 2.0 - 3.0 standard drinks of alcohol    Types: 2 - 3 Standard drinks or equivalent per week    Comment: 2-3 drinks weekly   Drug use: No   Sexual activity: Yes  Other Topics Concern   Not on file  Social History Narrative   Not on file   Social Determinants of Health   Financial Resource Strain: Not on file  Food Insecurity: Not on file  Transportation Needs: Not on file  Physical Activity: Not on file  Stress: Not on file  Social Connections: Unknown (08/11/2022)   Received from [facility]   Social Network    Social Network: Not on file  Intimate Partner Violence: Unknown (08/11/2022)   Received from [facility]   HITS    Physically Hurt: Not on file    Insult or Talk Down To: Not on file    Threaten Physical Harm: Not on file    Scream or Curse: Not on file    FAMILY HISTORY: Family History  Problem Relation Age of Onset   Arthritis Mother    Dementia Father    Cancer Maternal Grandfather    Cancer Paternal Grandfather     ALLERGIES:  is allergic to bee venom.  MEDICATIONS:  Current Outpatient Medications  Medication Sig Dispense Refill   Ascorbic Acid (VITAMIN C) 1000 MG tablet Take 3,000 mg by mouth daily.      buPROPion (WELLBUTRIN XL) 300 MG 24 hr tablet Take 1 tablet by mouth daily.     Cholecalciferol (VITAMIN D3) 50 MCG (2000 UT) capsule Take 6,000 Units by mouth daily.     Cyanocobalamin 1000 MCG TBCR Take by mouth.     hydrocortisone (CORTEF) 5 MG tablet Take 5 mg by mouth every morning.     MAGNESIUM PO Take by mouth.      melatonin 3 MG TABS tablet Take 3 mg by mouth at bedtime as needed.     Omega-3-6-9 CAPS Take 1 capsule by mouth daily. 1300 mg     rivaroxaban (XARELTO) 20 MG TABS tablet Take 1 tablet (20 mg total) by mouth daily with supper. 30 tablet 6   tamsulosin (FLOMAX) 0.4 MG CAPS capsule Take 0.4 mg by mouth daily.     testosterone cypionate (DEPOTESTOTERONE CYPIONATE) 100 MG/ML injection Inject into the muscle once a week. For IM use only     traZODone (DESYREL) 100 MG tablet Take 100 mg by mouth at bedtime as needed for sleep.     zinc gluconate 50 MG tablet Take 100 mg by mouth daily.     Current Facility-Administered Medications  Medication Dose Route Frequency Provider Last Rate Last Admin   betamethasone acetate-betamethasone sodium phosphate (CELESTONE) injection 3 mg  3 mg Intra-articular Once Evans,  Larena Glassman, DPM         PHYSICAL EXAMINATION: ECOG PERFORMANCE STATUS: 0 - Asymptomatic  Vitals:   12/23/22 1209  BP: (!) 139/90  Pulse: 75  Resp: 20  Temp: 98.2 F (36.8 C)  SpO2: 98%   Filed Weights   12/23/22 1209  Weight: 248 lb 1.6 oz (112.5 kg)    Physical Exam Constitutional:      Appearance: Normal appearance.  Cardiovascular:     Rate and Rhythm: Normal rate and regular rhythm.     Pulses: Normal pulses.     Heart sounds: Normal heart sounds.  Pulmonary:     Effort: Pulmonary effort is normal.     Breath sounds: Normal breath sounds. No stridor.  Musculoskeletal:        General: No swelling (RLE swelling resolved).     Cervical back: Normal range of motion and neck supple. No rigidity.  Lymphadenopathy:     Cervical: No cervical adenopathy.  Neurological:     Mental Status: He is alert.      LABORATORY DATA:  I have reviewed the data as listed Lab Results  Component Value Date   WBC 8.2 11/28/2019   HGB 18.0 (H) 03/23/2022   HCT 53.0 (H) 03/23/2022   MCV 91.8 11/28/2019   PLT 302 11/28/2019     Chemistry      Component Value Date/Time   NA 141  03/23/2022 1303   K 3.8 03/23/2022 1303   CL 107 03/23/2022 1303   CO2 26 11/28/2019 0456   BUN 19 03/23/2022 1303   CREATININE 1.00 03/23/2022 1303      Component Value Date/Time   CALCIUM 8.4 (L) 11/28/2019 0456   ALKPHOS 79 11/28/2019 0456   AST 51 (H) 11/28/2019 0456   ALT 75 (H) 11/28/2019 0456   BILITOT 0.6 11/28/2019 0456       RADIOGRAPHIC STUDIES: I have personally reviewed the radiological images as listed and agreed with the findings in the report. VAS [place] IVC/ILIAC (VENOUS ONLY)  Result Date: 11/29/2022 IVC/ILIAC STUDY Patient Name:  Phillip Hester  Date of Exam:   11/29/2022 Medical Rec #: [id number]         Accession #:    [id number] Date of Birth: September 25, 1955         Patient Gender: M Patient Age:   3 years Exam Location:  [facility] Procedure:      VAS [place] IVC/ILIAC (VENOUS ONLY) Referring Phys: Coral Else --------------------------------------------------------------------------------  Indications: IVC, right common iliac and external iliac vein evaluation Vascular Interventions: Intravascular ultrasound (IVUS) : Inferior vena cava,                         right CIV, R EIV, common femoral, femoral and popliteal                         vein.                         Mechanical thrombectomy: right EIV, common femoral,                         femoral and popliteal vein                         Balloon venoplasty, right CIV, CIV, common femoral,  femoral and popliteal vein Limitations: Air/bowel gas and obesity.  Performing Technologist: Elita Quick RVT  Examination Guidelines: A complete evaluation includes B-mode imaging, spectral Doppler, color Doppler, and power Doppler as needed of all accessible portions of each vessel. Bilateral testing is considered an integral part of a complete examination. Limited examinations for reoccurring indications may be performed as noted.  IVC/Iliac Findings: +----------+------+--------+--------------+     IVC    PatentThrombus   Comments    +----------+------+--------+--------------+ IVC Prox  patent                       +----------+------+--------+--------------+ IVC Mid                 not visualized +----------+------+--------+--------------+ IVC Distal              not visualized +----------+------+--------+--------------+  +-------------------+---------+-----------+---------+-----------+--------------+         CIV        RT-PatentRT-ThrombusLT-PatentLT-Thrombus   Comments    +-------------------+---------+-----------+---------+-----------+--------------+ Common Iliac Prox   patent                                                +-------------------+---------+-----------+---------+-----------+--------------+ Common Iliac Mid                                           not visualized +-------------------+---------+-----------+---------+-----------+--------------+ Common Iliac Distal                                        not visualized +-------------------+---------+-----------+---------+-----------+--------------+  +-------------------------+---------+-----------+---------+-----------+--------+            EIV           RT-PatentRT-ThrombusLT-PatentLT-ThrombusComments +-------------------------+---------+-----------+---------+-----------+--------+ External Iliac Vein Prox  patent                                          +-------------------------+---------+-----------+---------+-----------+--------+ External Iliac Vein Mid   patent                                          +-------------------------+---------+-----------+---------+-----------+--------+ External Iliac Vein       patent              patent                      Distal                                                                    +-------------------------+---------+-----------+---------+-----------+--------+   Summary: IVC/Iliac: Suboptimal exam. Patient not NPO.  Patent proximal IVC, proximal commoon iliac vein, and proximal to distal external iliac vein.  *See table(s) above for measurements and observations.  Electronically signed by Coral Else MD on 11/29/2022 at 4:04:20  PM.    Final    VAS [place] LOWER EXTREMITY VENOUS (DVT)  Result Date: 11/29/2022  Lower Venous DVT Study Patient Name:  Phillip Hester  Date of Exam:   11/29/2022 Medical Rec #: [id number]         Accession #:    [id number] Date of Birth: 31-Mar-1956         Patient Gender: M Patient Age:   43 years Exam Location:  [facility] Procedure:      VAS [place] LOWER EXTREMITY VENOUS (DVT) Referring Phys: Coral Else --------------------------------------------------------------------------------  Indications: Right lower extremity thrombectomy evaluation s/p IVUS10/2023.  Risk Factors: . Performing Technologist: Elita Quick RVT  Examination Guidelines: A complete evaluation includes B-mode imaging, spectral Doppler, color Doppler, and power Doppler as needed of all accessible portions of each vessel. Bilateral testing is considered an integral part of a complete examination. Limited examinations for reoccurring indications may be performed as noted. The reflux portion of the exam is performed with the patient in reverse Trendelenburg.  +---------+---------------+---------+-----------+----------+--------------+ RIGHT    CompressibilityPhasicitySpontaneityPropertiesThrombus Aging +---------+---------------+---------+-----------+----------+--------------+ CFV      Full           Yes      Yes                                 +---------+---------------+---------+-----------+----------+--------------+ SFJ      Full           Yes      Yes                                 +---------+---------------+---------+-----------+----------+--------------+ FV Prox  Full           Yes      Yes                                  +---------+---------------+---------+-----------+----------+--------------+ FV Mid   Full           Yes      Yes                                 +---------+---------------+---------+-----------+----------+--------------+ FV DistalFull           Yes      Yes                                 +---------+---------------+---------+-----------+----------+--------------+ PFV      Full           Yes      Yes                                 +---------+---------------+---------+-----------+----------+--------------+ POP      Full           Yes      Yes                                 +---------+---------------+---------+-----------+----------+--------------+ PTV      Full           Yes      Yes                                 +---------+---------------+---------+-----------+----------+--------------+  PERO     Full           Yes      Yes                                 +---------+---------------+---------+-----------+----------+--------------+ Gastroc  Full           Yes      Yes                                 +---------+---------------+---------+-----------+----------+--------------+ GSV      Full           Yes      Yes                                 +---------+---------------+---------+-----------+----------+--------------+ SSV      Full           Yes      Yes                                 +---------+---------------+---------+-----------+----------+--------------+   +----+---------------+---------+-----------+----------+--------------+ LEFTCompressibilityPhasicitySpontaneityPropertiesThrombus Aging +----+---------------+---------+-----------+----------+--------------+ CFV Full           Yes      Yes                                 +----+---------------+---------+-----------+----------+--------------+    Summary: RIGHT: - There is no evidence of deep vein thrombosis in the lower extremity. - There is no evidence of superficial venous thrombosis.    *See table(s) above for measurements and observations. Electronically signed by Coral Else MD on 11/29/2022 at 4:02:46 PM.    Final     All questions were answered. The patient knows to call the clinic with any problems, questions or concerns.  I spent 30 minutes in the care of this patient including H and P, review of records, counseling and coordination of care.    Rachel Moulds, MD 12/23/2022 12:16 PM

## 2022-12-24 LAB — DRVVT MIX: dRVVT Mix: 40.3 s (ref 0.0–40.4)

## 2022-12-24 LAB — LUPUS ANTICOAGULANT PANEL
DRVVT: 48.3 s — ABNORMAL HIGH (ref 0.0–47.0)
PTT Lupus Anticoagulant: 33.7 s (ref 0.0–43.5)

## 2022-12-25 DIAGNOSIS — G4733 Obstructive sleep apnea (adult) (pediatric): Secondary | ICD-10-CM | POA: Diagnosis not present

## 2022-12-29 DIAGNOSIS — M170 Bilateral primary osteoarthritis of knee: Secondary | ICD-10-CM | POA: Diagnosis not present

## 2023-01-05 ENCOUNTER — Inpatient Hospital Stay: Payer: Medicare Other | Admitting: Hematology and Oncology

## 2023-01-05 DIAGNOSIS — I82411 Acute embolism and thrombosis of right femoral vein: Secondary | ICD-10-CM | POA: Diagnosis not present

## 2023-01-05 NOTE — Progress Notes (Signed)
[facility] CONSULT NOTE  Patient Care Team: Chari Manning, MD as PCP - General (Family Medicine) Rachel Moulds, MD as Consulting Physician (Hematology and Oncology) Rachel Moulds, MD as Consulting Physician (Hematology and Oncology) Rachel Moulds, MD as Consulting Physician (Hematology and Oncology)  CHIEF COMPLAINTS/PURPOSE OF CONSULTATION:  Right lower extremity DVT  ASSESSMENT & PLAN:   This is a 67 year old male patient with past medical history significant for dyslipidemia, obstructive sleep apnea, history of PE after right hip replacement about 2 years ago now diagnosed with right lower extremity DVT status post thrombectomy yesterday on Xarelto referred to hematology for anticoagulation recommendations. His first episode of PE appears to be provoked after hip replacement, had 3 months of anticoagulation approximately.  Hypercoagulable workup showed abnormal lupus anticoagulant likely from concomitant use of Xarelto, repeat workup did not show evidence of persistent lupus anticoagulant.  He now continues on baby aspirin, be aware of symptoms and signs of DVT/PE and if he ends up having a third episode even if it is provoked, I would recommend indefinite anticoagulation.  I have also asked him to completely stop testosterone supplementation if he desires to stay off of anticoagulation.  If he wants to continue testosterone, I would recommend he continue the anticoagulation.  I have once again discussed all the above-mentioned today. He understands the need to go to the emergency room with any concerns or symptoms or signs concerning for DVT/PE. Age appropriate cancer screening recommended. He can return to clinic with [place] as needed  HISTORY OF PRESENTING ILLNESS:  Phillip Hester 67 y.o. male is here because of DVT of right lower extremity.  This is a very pleasant 67 year old male patient with past medical history significant for PE 2 years ago after hip  replacement, DVT of the right lower extremity most recently currently on Xarelto starter pack referred to hematology for additional recommendations.  He is here for telephone visit to review his most recent lab results.  Rest of the pertinent 10 point ROS reviewed and negative   MEDICAL HISTORY:  Past Medical History:  Diagnosis Date   Arthritis    knees   Cancer (HCC)    bladder cancer   DVT (deep venous thrombosis) (HCC)    GERD (gastroesophageal reflux disease)    Heart murmur    diagnosed with "athlete's murmur" at 17   History of kidney stones    Hyperlipidemia    Pure hypercholesterolemia    Sleep apnea    CPAP at night    SURGICAL HISTORY: Past Surgical History:  Procedure Laterality Date   INGUINAL HERNIA REPAIR     IRRIGATION AND DEBRIDEMENT SEBACEOUS CYST     KNEE SURGERY Left 2013   PERIPHERAL VASCULAR BALLOON ANGIOPLASTY Right 03/23/2022   Procedure: PERIPHERAL VASCULAR BALLOON ANGIOPLASTY;  Surgeon: Nada Libman, MD;  Location: [facility];  Service: Cardiovascular;  Laterality: Right;   PERIPHERAL VASCULAR THROMBECTOMY N/A 03/23/2022   Procedure: PERIPHERAL VASCULAR THROMBECTOMY;  Surgeon: Nada Libman, MD;  Location: [facility];  Service: Cardiovascular;  Laterality: N/A;   SHOULDER SURGERY Right 2013   TOTAL HIP ARTHROPLASTY Right 11/20/2019   Procedure: RIGHT TOTAL HIP ARTHROPLASTY ANTERIOR APPROACH;  Surgeon: Kathryne Hitch, MD;  Location: [facility] OR;  Service: Orthopedics;  Laterality: Right;   WISDOM TOOTH EXTRACTION      SOCIAL HISTORY: Social History   Socioeconomic History   Marital status: Legally Separated    Spouse name: Not on file   Number of  children: 2   Years of education: Not on file   Highest education level: Not on file  Occupational History   Occupation: Pharmacologist: [employer]  Tobacco Use   Smoking status: Never   Smokeless tobacco: Never  Vaping Use   Vaping status: Never  Used  Substance and Sexual Activity   Alcohol use: Yes    Alcohol/week: 2.0 - 3.0 standard drinks of alcohol    Types: 2 - 3 Standard drinks or equivalent per week    Comment: 2-3 drinks weekly   Drug use: No   Sexual activity: Yes  Other Topics Concern   Not on file  Social History Narrative   Not on file   Social Determinants of Health   Financial Resource Strain: Not on file  Food Insecurity: Not on file  Transportation Needs: Not on file  Physical Activity: Not on file  Stress: Not on file  Social Connections: Unknown (08/11/2022)   Received from [facility]   Social Network    Social Network: Not on file  Intimate Partner Violence: Unknown (08/11/2022)   Received from [facility]   HITS    Physically Hurt: Not on file    Insult or Talk Down To: Not on file    Threaten Physical Harm: Not on file    Scream or Curse: Not on file    FAMILY HISTORY: Family History  Problem Relation Age of Onset   Arthritis Mother    Dementia Father    Cancer Maternal Grandfather    Cancer Paternal Grandfather     ALLERGIES:  is allergic to bee venom.  MEDICATIONS:  Current Outpatient Medications  Medication Sig Dispense Refill   Ascorbic Acid (VITAMIN C) 1000 MG tablet Take 3,000 mg by mouth daily.      buPROPion (WELLBUTRIN XL) 300 MG 24 hr tablet Take 1 tablet by mouth daily.     Cholecalciferol (VITAMIN D3) 50 MCG (2000 UT) capsule Take 6,000 Units by mouth daily.     Cyanocobalamin 1000 MCG TBCR Take by mouth.     hydrocortisone (CORTEF) 5 MG tablet Take 5 mg by mouth every morning.     MAGNESIUM PO Take by mouth.     melatonin 3 MG TABS tablet Take 3 mg by mouth at bedtime as needed.     Omega-3-6-9 CAPS Take 1 capsule by mouth daily. 1300 mg     tamsulosin (FLOMAX) 0.4 MG CAPS capsule Take 0.4 mg by mouth daily.     testosterone cypionate (DEPOTESTOTERONE CYPIONATE) 100 MG/ML injection Inject into the muscle once a week. For IM use only     traZODone (DESYREL) 100 MG  tablet Take 100 mg by mouth at bedtime as needed for sleep.     zinc gluconate 50 MG tablet Take 100 mg by mouth daily.     Current Facility-Administered Medications  Medication Dose Route Frequency Provider Last Rate Last Admin   betamethasone acetate-betamethasone sodium phosphate (CELESTONE) injection 3 mg  3 mg Intra-articular Once Felecia Shelling, DPM         PHYSICAL EXAMINATION: ECOG PERFORMANCE STATUS: 0 - Asymptomatic  There were no vitals filed for this visit.  There were no vitals filed for this visit.  PE not done, telephone visit.  LABORATORY DATA:  I have reviewed the data as listed Lab Results  Component Value Date   WBC 8.2 11/28/2019   HGB 18.0 (H) 03/23/2022   HCT 53.0 (H) 03/23/2022   MCV 91.8  11/28/2019   PLT 302 11/28/2019     Chemistry      Component Value Date/Time   NA 141 03/23/2022 1303   K 3.8 03/23/2022 1303   CL 107 03/23/2022 1303   CO2 26 11/28/2019 0456   BUN 19 03/23/2022 1303   CREATININE 1.00 03/23/2022 1303      Component Value Date/Time   CALCIUM 8.4 (L) 11/28/2019 0456   ALKPHOS 79 11/28/2019 0456   AST 51 (H) 11/28/2019 0456   ALT 75 (H) 11/28/2019 0456   BILITOT 0.6 11/28/2019 0456       RADIOGRAPHIC STUDIES: I have personally reviewed the radiological images as listed and agreed with the findings in the report. No results found.  All questions were answered. The patient knows to call the clinic with any problems, questions or concerns.  I spent 8 minutes in the care of this patient including H and P, review of records, counseling and coordination of care.    Rachel Moulds, MD 01/05/2023 12:39 PM

## 2023-01-13 DIAGNOSIS — Z96641 Presence of right artificial hip joint: Secondary | ICD-10-CM | POA: Diagnosis not present

## 2023-01-13 DIAGNOSIS — Z471 Aftercare following joint replacement surgery: Secondary | ICD-10-CM | POA: Diagnosis not present

## 2023-01-25 DIAGNOSIS — G4733 Obstructive sleep apnea (adult) (pediatric): Secondary | ICD-10-CM | POA: Diagnosis not present

## 2023-01-27 DIAGNOSIS — L0231 Cutaneous abscess of buttock: Secondary | ICD-10-CM | POA: Diagnosis not present

## 2023-01-31 ENCOUNTER — Ambulatory Visit (INDEPENDENT_AMBULATORY_CARE_PROVIDER_SITE_OTHER): Payer: Medicare Other | Admitting: Podiatry

## 2023-01-31 ENCOUNTER — Encounter: Payer: Self-pay | Admitting: Podiatry

## 2023-01-31 DIAGNOSIS — M7752 Other enthesopathy of left foot: Secondary | ICD-10-CM | POA: Diagnosis not present

## 2023-01-31 DIAGNOSIS — M7672 Peroneal tendinitis, left leg: Secondary | ICD-10-CM | POA: Diagnosis not present

## 2023-01-31 DIAGNOSIS — S92212D Displaced fracture of cuboid bone of left foot, subsequent encounter for fracture with routine healing: Secondary | ICD-10-CM | POA: Diagnosis not present

## 2023-01-31 MED ORDER — BETAMETHASONE SOD PHOS & ACET 6 (3-3) MG/ML IJ SUSP
3.0000 mg | Freq: Once | INTRAMUSCULAR | Status: AC
Start: 2023-01-31 — End: 2023-01-31
  Administered 2023-01-31: 3 mg via INTRA_ARTICULAR

## 2023-01-31 NOTE — Progress Notes (Signed)
HPI: 67 y.o. male presenting today for follow-up evaluation of left foot peroneal tendinitis.  Patient states that recently he has had an exacerbation of his pain and tenderness to the lateral aspect of the left ankle.  Since last visit on 07/07/2022 the patient has managed the pain.  Over the last few weeks he has had an exacerbation of the pain.  Past Medical History:  Diagnosis Date   Arthritis    knees   Cancer (HCC)    bladder cancer   DVT (deep venous thrombosis) (HCC)    GERD (gastroesophageal reflux disease)    Heart murmur    diagnosed with "athlete's murmur" at 17   History of kidney stones    Hyperlipidemia    Pure hypercholesterolemia    Sleep apnea    CPAP at night    Past Surgical History:  Procedure Laterality Date   INGUINAL HERNIA REPAIR     IRRIGATION AND DEBRIDEMENT SEBACEOUS CYST     KNEE SURGERY Left 2013   PERIPHERAL VASCULAR BALLOON ANGIOPLASTY Right 03/23/2022   Procedure: PERIPHERAL VASCULAR BALLOON ANGIOPLASTY;  Surgeon: Nada Libman, MD;  [facility]: [facility];  Service: Cardiovascular;  Laterality: Right;   PERIPHERAL VASCULAR THROMBECTOMY N/A 03/23/2022   Procedure: PERIPHERAL VASCULAR THROMBECTOMY;  Surgeon: Nada Libman, MD;  Location: [facility];  Service: Cardiovascular;  Laterality: N/A;   SHOULDER SURGERY Right 2013   TOTAL HIP ARTHROPLASTY Right 11/20/2019   Procedure: RIGHT TOTAL HIP ARTHROPLASTY ANTERIOR APPROACH;  Surgeon: Kathryne Hitch, MD;  Location: [facility];  Service: Orthopedics;  Laterality: Right;   WISDOM TOOTH EXTRACTION      Allergies  Allergen Reactions   Bee Venom Anaphylaxis     Physical Exam: General: The patient is alert and oriented x3 in no acute distress.  Dermatology: Skin is warm, dry and supple bilateral lower extremities. Negative for open lesions or macerations.  Vascular: Palpable pedal pulses bilaterally. Capillary refill within normal limits.  Negative for any significant edema  or erythema  Neurological: Light touch and protective threshold grossly intact  Musculoskeletal Exam: There continues to be pain on palpation along the peroneal tendon left foot as well as along the lateral aspect of the left ankle joint..  Palpation of the cuboid does not elicit any pain or symptoms.   MRI LT foot WO contrast 05/27/2021:  IMPRESSION: 1. Nondisplaced fracture of the cuboid bone. 2. Mild focal bone marrow edema along the plantar aspect of the navicular without a discernible fracture line. Findings may represent a stress reaction. 3. The plantar cuneonavicular ligament is heterogeneous and edematous, at least partially torn. 4. Mild tendinosis and tenosynovitis of the distal peroneus longus tendon.  MRI LT FOOT WO CONTRAST 11/03/2021: Peroneal: The peroneus brevis tendon is intact. There is linear fluid bright signal indicating a longitudinal split tear of the peroneus longus tendon starting just distal to the fibula and involving an approximate 4 cm length of the tendon down to the plantar lateral aspect of the anterior process of the calcaneus (axial series 3 images 24 through 35). Portions of this tear extend through both the anterior and posterior walls incompletely split the tendon longitudinally.  IMPRESSION: Compared to 04/06/2021: 1. Interval healing of prior cuboid fracture. Resolution of the soft tissue edema plantar to the navicular. 2. There is an extensive longitudinal split tear of the peroneus longus tendon starting just distal to the fibula and involving a 4 cm length of the tendon. 3. High-grade thinning of  the anterolateral tibial plafond cartilage. 4. No bone abnormality is seen to correspond to the area of relative lucency seen on 08/31/2021 radiographs overlying the distal tibial diaphysis.   Assessment: 1.  Nondisplaced fracture cuboid LT; healed 2.  Longitudinal split tear peroneus longus tendon LT 3.  Capsulitis left ankle   Plan of  Care:  -Patient evaluated.   -Injection of 0.5 cc Celestone Soluspan injected along the peroneal tendon sheath left as well as the lateral aspect of the left ankle.   -Continue wearing good supportive shoes and sneakers. -Patient declined any oral anti-inflammatory -Return to clinic as needed  *Goes by [employer].  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      Felecia Shelling, DPM [facility]  Dr. Felecia Shelling, DPM    [address] [place], [place] [address]                Office [phone]  Fax [phone]

## 2023-02-01 ENCOUNTER — Encounter (HOSPITAL_BASED_OUTPATIENT_CLINIC_OR_DEPARTMENT_OTHER): Payer: Self-pay | Admitting: Pulmonary Disease

## 2023-02-01 ENCOUNTER — Ambulatory Visit (INDEPENDENT_AMBULATORY_CARE_PROVIDER_SITE_OTHER): Payer: Medicare Other | Admitting: Pulmonary Disease

## 2023-02-01 VITALS — BP 128/72 | HR 92 | Resp 21 | Ht 75.0 in | Wt 238.2 lb

## 2023-02-01 DIAGNOSIS — G4733 Obstructive sleep apnea (adult) (pediatric): Secondary | ICD-10-CM | POA: Diagnosis not present

## 2023-02-01 NOTE — Patient Instructions (Addendum)
We will obtain CPAP report and increased settings on your machine Please let us know if you have any issues. CPAP supplies will be renewed for a year

## 2023-02-01 NOTE — Assessment & Plan Note (Addendum)
He has obtained a new auto CPAP 5 to 15 cm.  He reports good compliance with the machine.  CPAP download on auto settings 5 to 15 cm shows average pressure of 9 and maximum pressure of 10 cm with mild leak.  He has excellent compliance about 8 hours per night.  CPAP has certainly helped improve his daytime somnolence and fatigue He requests increased pressure and we will change him to auto settings 10 to 15 cm CPAP supplies will be renewed for a year  Weight loss encouraged, compliance with goal of at least 4-6 hrs every night is the expectation. Advised against medications with sedative side effects Cautioned against driving when sleepy - understanding that sleepiness will vary on a day to day basis

## 2023-02-01 NOTE — Progress Notes (Signed)
   Subjective:    Patient ID: Phillip Hester, male    DOB: 1955-07-27, 67 y.o.   MRN: [id number]  HPI  67 year old man presents to reestablish care for OSA  PMH : 11/2019 PE following hip surgery,  03/2020 DVT  -testosterone supplements stopped  Prostate cancer, HLD, depression, ED, vitamin B12 deficiency,   Chief Complaint  Patient presents with   Follow-up    Follow up osa , discuss change the air pressure , feels like it no as strong as it needs to be , the machine is working great    He was last seen by me in 2015.  He was maintained on CPAP of 10 cm.  He reestablished 09/2022 and prescription for new auto CPAP 5 to 15 cm was provided 10/2022 by [facility]. He has received a new AutoSet 11.  He feels that the pressure is too low.  He has settled down with nasal pillows He does not use humidity.  He admits to not changing the filters often  His weight is mostly unchanged from 232 to 238 pounds. Bedtime is around 10 PM wake up time is to be 6 AM.  He reports nocturia and when he wakes up around 4 AM he is unable to fall back asleep again   Significant tests/ events reviewed  11/2008 PSG: AHI 24/h, supine AHI was 59/h --predominant supine related obstructive sleep apnea, corrected by CPAP 10 cm, nasal pillows    Review of Systems neg for any significant sore throat, dysphagia, itching, sneezing, nasal congestion or excess/ purulent secretions, fever, chills, sweats, unintended wt loss, pleuritic or exertional cp, hempoptysis, orthopnea pnd or change in chronic leg swelling. Also denies presyncope, palpitations, heartburn, abdominal pain, nausea, vomiting, diarrhea or change in bowel or urinary habits, dysuria,hematuria, rash, arthralgias, visual complaints, headache, numbness weakness or ataxia.     Objective:   Physical Exam  Gen. Pleasant, obese, in no distress ENT - no lesions, no post nasal drip Neck: No JVD, no thyromegaly, no carotid bruits Lungs: no use of accessory muscles, no  dullness to percussion, decreased without rales or rhonchi  Cardiovascular: Rhythm regular, heart sounds  normal, no murmurs or gallops, no peripheral edema Musculoskeletal: No deformities, no cyanosis or clubbing , no tremors       Assessment & Plan:

## 2023-02-02 ENCOUNTER — Ambulatory Visit: Payer: Medicare Other | Admitting: Podiatry

## 2023-02-10 DIAGNOSIS — N401 Enlarged prostate with lower urinary tract symptoms: Secondary | ICD-10-CM | POA: Diagnosis not present

## 2023-02-10 DIAGNOSIS — N3289 Other specified disorders of bladder: Secondary | ICD-10-CM | POA: Diagnosis not present

## 2023-02-10 DIAGNOSIS — Z08 Encounter for follow-up examination after completed treatment for malignant neoplasm: Secondary | ICD-10-CM | POA: Diagnosis not present

## 2023-02-10 DIAGNOSIS — N529 Male erectile dysfunction, unspecified: Secondary | ICD-10-CM | POA: Diagnosis not present

## 2023-02-10 DIAGNOSIS — Z8551 Personal history of malignant neoplasm of bladder: Secondary | ICD-10-CM | POA: Diagnosis not present

## 2023-02-11 ENCOUNTER — Ambulatory Visit (HOSPITAL_COMMUNITY)
Admission: RE | Admit: 2023-02-11 | Discharge: 2023-02-11 | Disposition: A | Discharge status: Home / Self Care | Payer: Medicare Other | Source: Ambulatory Visit | Attending: Hematology and Oncology | Admitting: Hematology and Oncology

## 2023-02-11 ENCOUNTER — Telehealth: Payer: Self-pay | Admitting: *Deleted

## 2023-02-11 ENCOUNTER — Ambulatory Visit: Payer: Medicare Other

## 2023-02-11 ENCOUNTER — Other Ambulatory Visit (HOSPITAL_COMMUNITY): Payer: Self-pay

## 2023-02-11 ENCOUNTER — Inpatient Hospital Stay: Payer: Medicare Other | Attending: Hematology and Oncology

## 2023-02-11 ENCOUNTER — Other Ambulatory Visit: Payer: Self-pay | Admitting: *Deleted

## 2023-02-11 ENCOUNTER — Telehealth: Payer: Self-pay

## 2023-02-11 ENCOUNTER — Inpatient Hospital Stay: Payer: Medicare Other | Admitting: Hematology and Oncology

## 2023-02-11 ENCOUNTER — Other Ambulatory Visit: Payer: Self-pay | Admitting: Hematology and Oncology

## 2023-02-11 DIAGNOSIS — M79605 Pain in left leg: Secondary | ICD-10-CM

## 2023-02-11 DIAGNOSIS — I82432 Acute embolism and thrombosis of left popliteal vein: Secondary | ICD-10-CM | POA: Diagnosis not present

## 2023-02-11 DIAGNOSIS — I82411 Acute embolism and thrombosis of right femoral vein: Secondary | ICD-10-CM

## 2023-02-11 DIAGNOSIS — Z7901 Long term (current) use of anticoagulants: Secondary | ICD-10-CM | POA: Insufficient documentation

## 2023-02-11 MED ORDER — RIVAROXABAN (XARELTO) VTE STARTER PACK (15 & 20 MG)
ORAL_TABLET | ORAL | 0 refills | Status: DC
  Filled 2023-02-11: qty 51, 30d supply, fill #0

## 2023-02-11 NOTE — Telephone Encounter (Signed)
This RN spoke with pt per his call stating he developed pain in his left calf this am- "reminded me of the discomfort I had in the right calf with the DVT"  He took an xeralto and asa with diminishing of pain.  He spoke with his vascular MD who suggested he contact Dr Al Pimple.  Per MD - need to obtain doppler asap for further evaluation and recommendation of therapy.  Scheduled doppler at 2 pm today at [facility]- pt is aware.

## 2023-02-11 NOTE — Progress Notes (Signed)
[facility] CONSULT NOTE  Patient Care Team: Chari Manning, MD as PCP - General (Family Medicine) Rachel Moulds, MD as Consulting Physician (Hematology and Oncology) Rachel Moulds, MD as Consulting Physician (Hematology and Oncology) Rachel Moulds, MD as Consulting Physician (Hematology and Oncology) Rachel Moulds, MD as Consulting Physician (Hematology and Oncology)  CHIEF COMPLAINTS/PURPOSE OF CONSULTATION:  Right lower extremity DVT  ASSESSMENT & PLAN:   This is a 67 year old male patient with past medical history significant for dyslipidemia, obstructive sleep apnea, history of PE after right hip replacement about 2 years ago now diagnosed with right lower extremity DVT status post thrombectomy yesterday on Xarelto referred to hematology for anticoagulation recommendations. His first episode of PE appears to be provoked after hip replacement, had 3 months of anticoagulation approximately.  Hypercoagulable workup showed abnormal lupus anticoagulant likely from concomitant use of Xarelto, repeat workup did not show evidence of persistent lupus anticoagulant.  H  He called this morning with chief complaint of left lower extremity pain and was worried about a possible DVT.  Ultrasound of his leg today showed acute popliteal DVT and hence we have recommended that he restart on anticoagulation, sent a starter pack for Xarelto.  Will also proceed with the JAK2 evaluation as well as PNH panel as part of hypercoagulable workup.  At this point since this is his third episode of DVT, have recommended indefinite anticoagulation.  He understands the risk of bleeding with Xarelto on board.  He understands to go to the emergency room with worsening chest pain or shortness of breath or lower extremity swelling.  He told me that he has not been using any testosterone supplementation.  He also agreed to some systemic scans.  He can return to clinic in approximately 6  months. HISTORY OF PRESENTING ILLNESS:  Phillip Hester 67 y.o. male is here because of DVT of right lower extremity.  This is a very pleasant 66 year old male patient with past medical history significant for PE 2 years ago after hip replacement, DVT of the right lower extremity most recently currently on Xarelto starter pack referred to hematology for additional recommendations.  He is here for an unexpected follow-up given new onset left lower extremity pain.  He denies any chest pain or shortness of breath.  He denies any clear provoking factors except for spending yesterday in the sun.  He denies any dehydration or recent testosterone supplementation. Rest of the pertinent 10 point ROS reviewed and negative   MEDICAL HISTORY:  Past Medical History:  Diagnosis Date   Arthritis    knees   Cancer (HCC)    bladder cancer   DVT (deep venous thrombosis) (HCC)    GERD (gastroesophageal reflux disease)    Heart murmur    diagnosed with "athlete's murmur" at 17   History of kidney stones    Hyperlipidemia    Pure hypercholesterolemia    Sleep apnea    CPAP at night    SURGICAL HISTORY: Past Surgical History:  Procedure Laterality Date   INGUINAL HERNIA REPAIR     IRRIGATION AND DEBRIDEMENT SEBACEOUS CYST     KNEE SURGERY Left 2013   PERIPHERAL VASCULAR BALLOON ANGIOPLASTY Right 03/23/2022   Procedure: PERIPHERAL VASCULAR BALLOON ANGIOPLASTY;  Surgeon: Nada Libman, MD;  [facility];  Service: Cardiovascular;  Laterality: Right;   PERIPHERAL VASCULAR THROMBECTOMY N/A 03/23/2022   Procedure: PERIPHERAL VASCULAR THROMBECTOMY;  Surgeon: Nada Libman, MD;  [facility]: [facility];  Service: Cardiovascular;  Laterality:  N/A;   SHOULDER SURGERY Right 2013   TOTAL HIP ARTHROPLASTY Right 11/20/2019   Procedure: RIGHT TOTAL HIP ARTHROPLASTY ANTERIOR APPROACH;  Surgeon: Kathryne Hitch, MD;  Location: [facility];  Service: Orthopedics;  Laterality: Right;    WISDOM TOOTH EXTRACTION      SOCIAL HISTORY: Social History   Socioeconomic History   Marital status: Legally Separated    Spouse name: Not on file   Number of children: 2   Years of education: Not on file   Highest education level: Not on file  Occupational History   Occupation: Pharmacologist: [employer]  Tobacco Use   Smoking status: Never   Smokeless tobacco: Never  Vaping Use   Vaping status: Never Used  Substance and Sexual Activity   Alcohol use: Yes    Alcohol/week: 2.0 - 3.0 standard drinks of alcohol    Types: 2 - 3 Standard drinks or equivalent per week    Comment: 2-3 drinks weekly   Drug use: No   Sexual activity: Yes  Other Topics Concern   Not on file  Social History Narrative   Not on file   Social Determinants of Health   Financial Resource Strain: Not on file  Food Insecurity: Low Risk  (02/10/2023)   Received from [facility]   Food vital sign    Within the past 12 months, you worried that your food would run out before you got money to buy more: Never true    Within the past 12 months, the food you bought just didn't last and you didn't have money to get more. : Never true  Transportation Needs: No Transportation Needs (02/10/2023)   Received from [facility]    In the past 12 months, has lack of reliable transportation kept you from medical appointments, meetings, work or from getting things needed for daily living? : No  Physical Activity: Not on file  Stress: Not on file  Social Connections: Unknown (08/11/2022)   Received from [facility]   Social Network    Social Network: Not on file  Intimate Partner Violence: Unknown (08/11/2022)   Received from [facility]   HITS    Physically Hurt: Not on file    Insult or Talk Down To: Not on file    Threaten Physical Harm: Not on file    Scream or Curse: Not on file    FAMILY HISTORY: Family History  Problem Relation Age of Onset   Arthritis Mother     Dementia Father    Cancer Maternal Grandfather    Cancer Paternal Grandfather     ALLERGIES:  is allergic to bee venom.  MEDICATIONS:  Current Outpatient Medications  Medication Sig Dispense Refill   Ascorbic Acid (VITAMIN C) 1000 MG tablet Take 3,000 mg by mouth daily.      buPROPion (WELLBUTRIN XL) 300 MG 24 hr tablet Take 1 tablet by mouth daily.     Cholecalciferol (VITAMIN D3) 50 MCG (2000 UT) capsule Take 6,000 Units by mouth daily.     Cyanocobalamin 1000 MCG TBCR Take by mouth.     hydrocortisone (CORTEF) 5 MG tablet Take 5 mg by mouth every morning.     MAGNESIUM PO Take by mouth.     melatonin 3 MG TABS tablet Take 3 mg by mouth at bedtime as needed.     Omega-3-6-9 CAPS Take 1 capsule by mouth daily. 1300 mg     RIVAROXABAN (XARELTO) VTE STARTER PACK (  15 & 20 MG) Follow package directions: Take one 15mg  tablet by mouth twice a day. On day 22, switch to one 20mg  tablet once a day. Take with food. 51 each 0   tamsulosin (FLOMAX) 0.4 MG CAPS capsule Take 0.4 mg by mouth daily.     testosterone cypionate (DEPOTESTOTERONE CYPIONATE) 100 MG/ML injection Inject into the muscle once a week. For IM use only (Patient not taking: Reported on 02/01/2023)     traZODone (DESYREL) 100 MG tablet Take 100 mg by mouth at bedtime as needed for sleep.     zinc gluconate 50 MG tablet Take 100 mg by mouth daily.     No current facility-administered medications for this visit.     PHYSICAL EXAMINATION: ECOG PERFORMANCE STATUS: 0 - Asymptomatic  There were no vitals filed for this visit.  There were no vitals filed for this visit. Left lower extremity swelling noted.  He otherwise is in no acute distress.  LABORATORY DATA:  I have reviewed the data as listed Lab Results  Component Value Date   WBC 8.2 11/28/2019   HGB 18.0 (H) 03/23/2022   HCT 53.0 (H) 03/23/2022   MCV 91.8 11/28/2019   PLT 302 11/28/2019     Chemistry      Component Value Date/Time   NA 141 03/23/2022 1303    K 3.8 03/23/2022 1303   CL 107 03/23/2022 1303   CO2 26 11/28/2019 0456   BUN 19 03/23/2022 1303   CREATININE 1.00 03/23/2022 1303      Component Value Date/Time   CALCIUM 8.4 (L) 11/28/2019 0456   ALKPHOS 79 11/28/2019 0456   AST 51 (H) 11/28/2019 0456   ALT 75 (H) 11/28/2019 0456   BILITOT 0.6 11/28/2019 0456       RADIOGRAPHIC STUDIES: I have personally reviewed the radiological images as listed and agreed with the findings in the report. VAS [place] LOWER EXTREMITY VENOUS (DVT)  Result Date: 02/11/2023  Lower Venous DVT Study Patient Name:  ALOISE SHELLMAN  Date of Exam:   02/11/2023 Medical Rec #: [id number]         Accession #:    [id number] Date of Birth: 10/31/1955         Patient Gender: M Patient Age:   67 years Exam Location:  [facility] Procedure:      VAS [place] LOWER EXTREMITY VENOUS (DVT) Referring Phys: Burnice Logan Tashawna Thom --------------------------------------------------------------------------------  Indications: Pain.  Comparison Study: No prior studies. Performing Technologist: Chanda Busing RVT  Examination Guidelines: A complete evaluation includes B-mode imaging, spectral Doppler, color Doppler, and power Doppler as needed of all accessible portions of each vessel. Bilateral testing is considered an integral part of a complete examination. Limited examinations for reoccurring indications may be performed as noted. The reflux portion of the exam is performed with the patient in reverse Trendelenburg.  +-----+---------------+---------+-----------+----------+--------------+ RIGHTCompressibilityPhasicitySpontaneityPropertiesThrombus Aging +-----+---------------+---------+-----------+----------+--------------+ CFV  Full           Yes      Yes                                 +-----+---------------+---------+-----------+----------+--------------+   +---------+---------------+---------+-----------+----------+--------------+ LEFT      CompressibilityPhasicitySpontaneityPropertiesThrombus Aging +---------+---------------+---------+-----------+----------+--------------+ CFV      Full           Yes      Yes                                 +---------+---------------+---------+-----------+----------+--------------+  SFJ      Full                                                        +---------+---------------+---------+-----------+----------+--------------+ FV Prox  Full                                                        +---------+---------------+---------+-----------+----------+--------------+ FV Mid   Full                                                        +---------+---------------+---------+-----------+----------+--------------+ FV DistalFull                                                        +---------+---------------+---------+-----------+----------+--------------+ PFV      Full                                                        +---------+---------------+---------+-----------+----------+--------------+ POP      None           No       No                   Acute          +---------+---------------+---------+-----------+----------+--------------+ PTV      Partial                                      Acute          +---------+---------------+---------+-----------+----------+--------------+ PERO     Partial                                      Acute          +---------+---------------+---------+-----------+----------+--------------+ Gastroc  Full                                                        +---------+---------------+---------+-----------+----------+--------------+     Summary: RIGHT: - No evidence of common femoral vein obstruction.   LEFT: - Findings consistent with acute deep vein thrombosis involving the left popliteal vein, left peroneal veins, and left posterior tibial veins.  - No cystic structure found in the popliteal fossa.  *See  table(s) above for measurements and observations.    Preliminary     All questions were answered. The patient knows to  call the clinic with any problems, questions or concerns.    Rachel Moulds, MD 02/11/2023 4:35 PM

## 2023-02-11 NOTE — Telephone Encounter (Signed)
Pt called with c/o DVT symptoms in L calf that started yesterday afternoon.  Reviewed pt's chart, returned call for clarification, two identifiers used. He stated it was dull pain and he had some leftover Xarelto that he took today. Given the fact that he was referred to hematology for anticoagulation, he was instructed to contact them for further advisement. At this time, there is no availability in the clinic and he may need to go to ED or be referred to DVT clinic. Confirmed understanding.

## 2023-02-11 NOTE — Progress Notes (Signed)
Left lower extremity venous duplex has been completed. Preliminary results can be found in CV Proc through chart review.  Results were given to Val at Dr. Remonia Richter office.  02/11/23 2:01 PM Olen Cordial RVT

## 2023-02-16 ENCOUNTER — Other Ambulatory Visit: Payer: Self-pay | Admitting: *Deleted

## 2023-02-16 ENCOUNTER — Inpatient Hospital Stay: Payer: Medicare Other | Attending: Hematology and Oncology

## 2023-02-16 DIAGNOSIS — I2699 Other pulmonary embolism without acute cor pulmonale: Secondary | ICD-10-CM

## 2023-02-16 DIAGNOSIS — D689 Coagulation defect, unspecified: Secondary | ICD-10-CM

## 2023-02-16 DIAGNOSIS — I82411 Acute embolism and thrombosis of right femoral vein: Secondary | ICD-10-CM

## 2023-02-16 DIAGNOSIS — M79605 Pain in left leg: Secondary | ICD-10-CM

## 2023-02-22 DIAGNOSIS — M47816 Spondylosis without myelopathy or radiculopathy, lumbar region: Secondary | ICD-10-CM | POA: Diagnosis not present

## 2023-02-22 LAB — PNH PROFILE (-HIGH SENSITIVITY)

## 2023-02-22 LAB — JAK2 (INCLUDING V617F AND EXON 12), MPL,& CALR W/RFL MPN PANEL (NGS)

## 2023-02-25 DIAGNOSIS — G4733 Obstructive sleep apnea (adult) (pediatric): Secondary | ICD-10-CM | POA: Diagnosis not present

## 2023-03-08 ENCOUNTER — Other Ambulatory Visit: Payer: Self-pay | Admitting: *Deleted

## 2023-03-08 MED ORDER — RIVAROXABAN 20 MG PO TABS: 20.0000 mg | ORAL_TABLET | Freq: Every day | ORAL | 6 refills | Status: DC

## 2023-03-16 DIAGNOSIS — M47816 Spondylosis without myelopathy or radiculopathy, lumbar region: Secondary | ICD-10-CM | POA: Diagnosis not present

## 2023-03-16 DIAGNOSIS — M25561 Pain in right knee: Secondary | ICD-10-CM | POA: Diagnosis not present

## 2023-03-16 DIAGNOSIS — M25562 Pain in left knee: Secondary | ICD-10-CM | POA: Diagnosis not present

## 2023-03-24 DIAGNOSIS — M25562 Pain in left knee: Secondary | ICD-10-CM | POA: Diagnosis not present

## 2023-04-19 DIAGNOSIS — M170 Bilateral primary osteoarthritis of knee: Secondary | ICD-10-CM | POA: Diagnosis not present

## 2023-05-02 DIAGNOSIS — Z85828 Personal history of other malignant neoplasm of skin: Secondary | ICD-10-CM | POA: Diagnosis not present

## 2023-05-02 DIAGNOSIS — L821 Other seborrheic keratosis: Secondary | ICD-10-CM | POA: Diagnosis not present

## 2023-05-02 DIAGNOSIS — L570 Actinic keratosis: Secondary | ICD-10-CM | POA: Diagnosis not present

## 2023-05-10 DIAGNOSIS — M1712 Unilateral primary osteoarthritis, left knee: Secondary | ICD-10-CM | POA: Diagnosis not present

## 2023-05-11 DIAGNOSIS — M1711 Unilateral primary osteoarthritis, right knee: Secondary | ICD-10-CM | POA: Diagnosis not present

## 2023-05-17 DIAGNOSIS — M1712 Unilateral primary osteoarthritis, left knee: Secondary | ICD-10-CM | POA: Diagnosis not present

## 2023-05-18 DIAGNOSIS — M1711 Unilateral primary osteoarthritis, right knee: Secondary | ICD-10-CM | POA: Diagnosis not present

## 2023-05-24 DIAGNOSIS — H43812 Vitreous degeneration, left eye: Secondary | ICD-10-CM | POA: Diagnosis not present

## 2023-05-24 DIAGNOSIS — H35373 Puckering of macula, bilateral: Secondary | ICD-10-CM | POA: Diagnosis not present

## 2023-05-24 DIAGNOSIS — H524 Presbyopia: Secondary | ICD-10-CM | POA: Diagnosis not present

## 2023-05-24 DIAGNOSIS — M1712 Unilateral primary osteoarthritis, left knee: Secondary | ICD-10-CM | POA: Diagnosis not present

## 2023-05-24 DIAGNOSIS — H25813 Combined forms of age-related cataract, bilateral: Secondary | ICD-10-CM | POA: Diagnosis not present

## 2023-05-24 DIAGNOSIS — H35363 Drusen (degenerative) of macula, bilateral: Secondary | ICD-10-CM | POA: Diagnosis not present

## 2023-05-25 ENCOUNTER — Encounter: Payer: Self-pay | Admitting: Podiatry

## 2023-05-25 ENCOUNTER — Ambulatory Visit: Payer: Medicare Other | Admitting: Podiatry

## 2023-05-25 DIAGNOSIS — M19072 Primary osteoarthritis, left ankle and foot: Secondary | ICD-10-CM

## 2023-05-25 DIAGNOSIS — M7672 Peroneal tendinitis, left leg: Secondary | ICD-10-CM | POA: Diagnosis not present

## 2023-05-25 DIAGNOSIS — M1711 Unilateral primary osteoarthritis, right knee: Secondary | ICD-10-CM | POA: Diagnosis not present

## 2023-05-25 NOTE — Progress Notes (Unsigned)
HPI: 67 y.o. male presenting today for follow-up evaluation of left foot peroneal tendinitis.  Patient states that recently he has had an exacerbation of his pain and tenderness to the lateral aspect of the left ankle.  Since last visit on 07/07/2022 the patient has managed the pain.  Over the last few weeks he has had an exacerbation of the pain.  Past Medical History:  Diagnosis Date   Arthritis    knees   Cancer (HCC)    bladder cancer   DVT (deep venous thrombosis) (HCC)    GERD (gastroesophageal reflux disease)    Heart murmur    diagnosed with "athlete's murmur" at 17   History of kidney stones    Hyperlipidemia    Pure hypercholesterolemia    Sleep apnea    CPAP at night    Past Surgical History:  Procedure Laterality Date   INGUINAL HERNIA REPAIR     IRRIGATION AND DEBRIDEMENT SEBACEOUS CYST     KNEE SURGERY Left 2013   PERIPHERAL VASCULAR BALLOON ANGIOPLASTY Right 03/23/2022   Procedure: PERIPHERAL VASCULAR BALLOON ANGIOPLASTY;  Surgeon: Nada Libman, MD;  Location: [facility];  Service: Cardiovascular;  Laterality: Right;   PERIPHERAL VASCULAR THROMBECTOMY N/A 03/23/2022   Procedure: PERIPHERAL VASCULAR THROMBECTOMY;  Surgeon: Nada Libman, MD;  Location: [facility];  Service: Cardiovascular;  Laterality: N/A;   SHOULDER SURGERY Right 2013   TOTAL HIP ARTHROPLASTY Right 11/20/2019   Procedure: RIGHT TOTAL HIP ARTHROPLASTY ANTERIOR APPROACH;  Surgeon: Kathryne Hitch, MD;  Location: [facility];  Service: Orthopedics;  Laterality: Right;   WISDOM TOOTH EXTRACTION      Allergies  Allergen Reactions   Bee Venom Anaphylaxis     Physical Exam: General: The patient is alert and oriented x3 in no acute distress.  Dermatology: Skin is warm, dry and supple bilateral lower extremities. Negative for open lesions or macerations.  Vascular: Palpable pedal pulses bilaterally. Capillary refill within normal limits.  Negative for any significant edema  or erythema  Neurological: Light touch and protective threshold grossly intact  Musculoskeletal Exam: There continues to be pain on palpation along the peroneal tendon left foot as well as along the lateral aspect of the left ankle joint..  Palpation of the cuboid does not elicit any pain or symptoms.   MRI LT foot WO contrast 05/27/2021:  IMPRESSION: 1. Nondisplaced fracture of the cuboid bone. 2. Mild focal bone marrow edema along the plantar aspect of the navicular without a discernible fracture line. Findings may represent a stress reaction. 3. The plantar cuneonavicular ligament is heterogeneous and edematous, at least partially torn. 4. Mild tendinosis and tenosynovitis of the distal peroneus longus tendon.  MRI LT FOOT WO CONTRAST 11/03/2021: Peroneal: The peroneus brevis tendon is intact. There is linear fluid bright signal indicating a longitudinal split tear of the peroneus longus tendon starting just distal to the fibula and involving an approximate 4 cm length of the tendon down to the plantar lateral aspect of the anterior process of the calcaneus (axial series 3 images 24 through 35). Portions of this tear extend through both the anterior and posterior walls incompletely split the tendon longitudinally.  IMPRESSION: Compared to 04/06/2021: 1. Interval healing of prior cuboid fracture. Resolution of the soft tissue edema plantar to the navicular. 2. There is an extensive longitudinal split tear of the peroneus longus tendon starting just distal to the fibula and involving a 4 cm length of the tendon. 3. High-grade thinning of  the anterolateral tibial plafond cartilage. 4. No bone abnormality is seen to correspond to the area of relative lucency seen on 08/31/2021 radiographs overlying the distal tibial diaphysis.   Assessment: 1.  Nondisplaced fracture cuboid LT; healed 2.  Longitudinal split tear peroneus longus tendon LT 3.  Capsulitis left ankle   Plan of  Care:  -Patient evaluated.   -Injection of 0.5 cc Celestone Soluspan injected along the peroneal tendon sheath left as well as the lateral aspect of the left ankle.   -Continue wearing good supportive shoes and sneakers. -Patient declined any oral anti-inflammatory -Return to clinic as needed  *Goes by [employer].  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      Felecia Shelling, DPM [facility]  Dr. Felecia Shelling, DPM    [address] [place], [place] [address]                Office [phone]  Fax [phone]

## 2023-05-26 DIAGNOSIS — C672 Malignant neoplasm of lateral wall of bladder: Secondary | ICD-10-CM | POA: Diagnosis not present

## 2023-05-26 DIAGNOSIS — N200 Calculus of kidney: Secondary | ICD-10-CM | POA: Diagnosis not present

## 2023-05-26 DIAGNOSIS — N281 Cyst of kidney, acquired: Secondary | ICD-10-CM | POA: Diagnosis not present

## 2023-05-26 DIAGNOSIS — R972 Elevated prostate specific antigen [PSA]: Secondary | ICD-10-CM | POA: Diagnosis not present

## 2023-06-13 ENCOUNTER — Ambulatory Visit: Payer: Medicare Other | Admitting: Podiatry

## 2023-06-22 DIAGNOSIS — M19072 Primary osteoarthritis, left ankle and foot: Secondary | ICD-10-CM | POA: Diagnosis not present

## 2023-06-22 MED ORDER — BETAMETHASONE SOD PHOS & ACET 6 (3-3) MG/ML IJ SUSP
3.0000 mg | Freq: Once | INTRAMUSCULAR | Status: AC
Start: 2023-06-22 — End: 2023-06-22
  Administered 2023-06-22: 3 mg via INTRA_ARTICULAR

## 2023-06-24 DIAGNOSIS — M170 Bilateral primary osteoarthritis of knee: Secondary | ICD-10-CM | POA: Diagnosis not present

## 2023-06-30 DIAGNOSIS — M170 Bilateral primary osteoarthritis of knee: Secondary | ICD-10-CM | POA: Diagnosis not present

## 2023-06-30 DIAGNOSIS — M1711 Unilateral primary osteoarthritis, right knee: Secondary | ICD-10-CM | POA: Diagnosis not present

## 2023-06-30 DIAGNOSIS — M1712 Unilateral primary osteoarthritis, left knee: Secondary | ICD-10-CM | POA: Diagnosis not present

## 2023-07-04 DIAGNOSIS — K080 Exfoliation of teeth due to systemic causes: Secondary | ICD-10-CM | POA: Diagnosis not present

## 2023-07-11 DIAGNOSIS — H43812 Vitreous degeneration, left eye: Secondary | ICD-10-CM | POA: Diagnosis not present

## 2023-07-11 DIAGNOSIS — H35373 Puckering of macula, bilateral: Secondary | ICD-10-CM | POA: Diagnosis not present

## 2023-07-11 NOTE — Progress Notes (Signed)
[facility] - Clinic Note  07/13/2023   CHIEF COMPLAINT Patient presents for Retina Evaluation  HISTORY OF PRESENT ILLNESS: Phillip Hester is a 68 y.o. male who presents to the clinic today for:  HPI     Retina Evaluation   In both eyes.  This started 2 days ago.  Duration of 2 days.  Associated Symptoms Flashes and Floaters.  Context:  mid-range vision and near vision.  I, the attending physician,  performed the HPI with the patient and updated documentation appropriately.        Comments   Retina eval per Dr Zenaida Niece ERM OU pt is reporting he saw her on Monday for follow up he had noticed he was having some flashes and floaters his near vision has not been as sharp       Last edited by Rennis Chris, MD on 07/13/2023  3:49 PM.    Pt is here on the referral of Dr. Zenaida Niece for concern of ERM OU, pt states about a month ago he got new glasses (progressives), but he doesn't feel like they have helped his vision, specifically his right eye, he denies any problems with his eyes or any previous eye sxs  Referring physician: Diona Foley, MD [address] [place],  [place] [address]  HISTORICAL INFORMATION:  Selected notes from the MEDICAL RECORD NUMBER Referred by Dr. Zenaida Niece for ERM OU LEE:  Ocular Hx- PMH-   CURRENT MEDICATIONS: No current outpatient medications on file. (Ophthalmic Drugs)   No current facility-administered medications for this visit. (Ophthalmic Drugs)   Current Outpatient Medications (Other)  Medication Sig   Ascorbic Acid (VITAMIN C) 1000 MG tablet Take 3,000 mg by mouth daily.    buPROPion (WELLBUTRIN XL) 300 MG 24 hr tablet Take 1 tablet by mouth daily.   Cholecalciferol (VITAMIN D3) 50 MCG (2000 UT) capsule Take 6,000 Units by mouth daily.   Cyanocobalamin 1000 MCG TBCR Take by mouth.   hydrocortisone (CORTEF) 5 MG tablet Take 5 mg by mouth every morning.   MAGNESIUM PO Take by mouth.   melatonin 3 MG TABS tablet Take 3 mg by mouth at  bedtime as needed.   tamsulosin (FLOMAX) 0.4 MG CAPS capsule Take 0.4 mg by mouth daily.   traZODone (DESYREL) 100 MG tablet Take 100 mg by mouth at bedtime as needed for sleep.   zinc gluconate 50 MG tablet Take 100 mg by mouth daily.   Omega-3-6-9 CAPS Take 1 capsule by mouth daily. 1300 mg   rivaroxaban (XARELTO) 20 MG TABS tablet Take 1 tablet (20 mg total) by mouth daily with supper. (Patient not taking: Reported on 07/13/2023)   testosterone cypionate (DEPOTESTOTERONE CYPIONATE) 100 MG/ML injection Inject into the muscle once a week. For IM use only (Patient not taking: Reported on 07/13/2023)   No current facility-administered medications for this visit. (Other)   REVIEW OF SYSTEMS: ROS   Positive for: Cardiovascular, Eyes Last edited by Etheleen Mayhew, COT on 07/13/2023  8:49 AM.     ALLERGIES Allergies  Allergen Reactions   Bee Venom Anaphylaxis   PAST MEDICAL HISTORY Past Medical History:  Diagnosis Date   Arthritis    knees   Cancer (HCC)    bladder cancer   DVT (deep venous thrombosis) (HCC)    GERD (gastroesophageal reflux disease)    Heart murmur    diagnosed with "athlete's murmur" at 17   History of kidney stones    Hyperlipidemia    Pure hypercholesterolemia  Sleep apnea    CPAP at night   Past Surgical History:  Procedure Laterality Date   INGUINAL HERNIA REPAIR     IRRIGATION AND DEBRIDEMENT SEBACEOUS CYST     KNEE SURGERY Left 2013   PERIPHERAL VASCULAR BALLOON ANGIOPLASTY Right 03/23/2022   Procedure: PERIPHERAL VASCULAR BALLOON ANGIOPLASTY;  Surgeon: Nada Libman, MD;  [facility]: [facility];  Service: Cardiovascular;  Laterality: Right;   PERIPHERAL VASCULAR THROMBECTOMY N/A 03/23/2022   Procedure: PERIPHERAL VASCULAR THROMBECTOMY;  Surgeon: Nada Libman, MD;  [facility]: [facility];  Service: Cardiovascular;  Laterality: N/A;   SHOULDER SURGERY Right 2013   TOTAL HIP ARTHROPLASTY Right 11/20/2019   Procedure: RIGHT  TOTAL HIP ARTHROPLASTY ANTERIOR APPROACH;  Surgeon: Kathryne Hitch, MD;  Location: [facility];  Service: Orthopedics;  Laterality: Right;   WISDOM TOOTH EXTRACTION     FAMILY HISTORY Family History  Problem Relation Age of Onset   Arthritis Mother    Dementia Father    Cancer Maternal Grandfather    Cancer Paternal Grandfather    SOCIAL HISTORY Social History   Tobacco Use   Smoking status: Never   Smokeless tobacco: Never  Vaping Use   Vaping status: Never Used  Substance Use Topics   Alcohol use: Yes    Alcohol/week: 2.0 - 3.0 standard drinks of alcohol    Types: 2 - 3 Standard drinks or equivalent per week    Comment: 2-3 drinks weekly   Drug use: No      OPHTHALMIC EXAM:  Base Eye Exam     Visual Acuity (Snellen - Linear)       Right Left   Dist cc 20/30 20/20 -1   Dist ph cc NI NI    Correction: Glasses         Tonometry (Tonopen, 8:54 AM)       Right Left   Pressure 17 18         Pupils       Pupils Dark Light Shape React APD   Right PERRL 4 3 Round Brisk None   Left PERRL 4 3 Round Brisk None         Visual Fields       Left Right    Full Full         Extraocular Movement       Right Left    Full, Ortho Full, Ortho         Neuro/Psych     Oriented x3: Yes   Mood/Affect: Normal         Dilation     Both eyes: 2.5% Phenylephrine @ 8:54 AM           Slit Lamp and Fundus Exam     External Exam       Right Left   External Normal Normal         Slit Lamp Exam       Right Left   Lids/Lashes Dermatochalasis - upper lid Dermatochalasis - upper lid   Conjunctiva/Sclera White and quiet White and quiet   Cornea mild arcus mild arcus   Anterior Chamber deep and clear deep and clear   Iris Round and dilated Round and dilated   Lens 2+ Nuclear sclerosis, 2+ Cortical cataract 2+ Nuclear sclerosis, 2+ Cortical cataract   Anterior Vitreous mild syneresis, Posterior vitreous detachment, vitreous condensations mild  syneresis, Posterior vitreous detachment, Weiss ring         Fundus Exam  Right Left   Disc Pink and Sharp, mild temporal PPA Pink and Sharp, temporal PPA   C/D Ratio 0.5 0.5   Macula Flat, Blunted foveal reflex, mild ERM, No heme or edema Flat, Blunted foveal reflex, mild ERM, No heme or edema   Vessels attenuated, mild tortuosity attenuated, mild tortuosity   Periphery Attached, peripheral drusen, No heme Attached, pigmented CR atrophy at 0530 and 0700, scattered peripheral drusen, No heme           Refraction     Manifest Refraction   Unable to correct OD any better           IMAGING AND PROCEDURES  Imaging and Procedures for 07/13/2023  OCT, Retina - OU - Both Eyes       Right Eye Quality was good. Central Foveal Thickness: 496. Progression has no prior data. Findings include no IRF, no SRF, abnormal foveal contour, epiretinal membrane, macular pucker (ERM with central thickening and mild pucker).   Left Eye Quality was good. Central Foveal Thickness: 344. Progression has no prior data. Findings include no IRF, no SRF, abnormal foveal contour, epiretinal membrane (ERM with blunting of foveal contour and early pucker).   Notes *Images captured and stored on drive  Diagnosis / Impression:  OD: ERM with central thickening and mild pucker OS: ERM with blunting of foveal contour and early pucker  Clinical management:  See below  Abbreviations: NFP - Normal foveal profile. CME - cystoid macular edema. PED - pigment epithelial detachment. IRF - intraretinal fluid. SRF - subretinal fluid. EZ - ellipsoid zone. ERM - epiretinal membrane. ORA - outer retinal atrophy. ORT - outer retinal tubulation. SRHM - subretinal hyper-reflective material. IRHM - intraretinal hyper-reflective material           ASSESSMENT/PLAN:   ICD-10-CM   1. Epiretinal membrane (ERM) of both eyes  H35.373 OCT, Retina - OU - Both Eyes    2. Combined forms of age-related cataract of both  eyes  H25.813      Epiretinal membrane, both eyes  - The natural history, anatomy, potential for loss of vision, and treatment options including vitrectomy techniques and the complications of endophthalmitis, retinal detachment, vitreous hemorrhage, cataract progression and permanent vision loss discussed with the patient. - mild ERM OU - OCT shows OD: ERM with central thickening and mild pucker; OS: ERM with blunting of foveal contour and early pucker - BCVA OD: 20/30, OS: 20/20 - mild metamorphopsia OD, OS asymptomatic - no indication for surgery at this time - monitor for now - f/u 3 mos -- DFE/OCT  2. Mixed Cataract OU - The symptoms of cataract, surgical options, and treatments and risks were discussed with patient. - discussed diagnosis and progression - monitor  Ophthalmic Meds Ordered this visit:  No orders of the defined types were placed in this encounter.    Return in about 3 months (around 10/11/2023) for f/u ERM OU, DFE, OCT.  There are no Patient Instructions on file for this visit.  Explained the diagnoses, plan, and follow up with the patient and they expressed understanding.  Patient expressed understanding of the importance of proper follow up care.   This document serves as a record of services personally performed by Karie Chimera, MD, PhD. It was created on their behalf by Glee Arvin. Manson Passey, OA an ophthalmic technician. The creation of this record is the provider's dictation and/or activities during the visit.    Electronically signed by: Glee Arvin. Manson Passey, OA 07/13/23 3:50 PM  Karie Chimera, M.D., Ph.D. Diseases & Surgery of the Retina and Vitreous [facility] 07/13/2023  I have reviewed the above documentation for accuracy and completeness, and I agree with the above. Karie Chimera, M.D., Ph.D. 07/13/23 3:53 PM   Abbreviations: M myopia (nearsighted); A astigmatism; H hyperopia (farsighted); P presbyopia; Mrx spectacle  prescription;  CTL contact lenses; OD right eye; OS left eye; OU both eyes  XT exotropia; ET esotropia; PEK punctate epithelial keratitis; PEE punctate epithelial erosions; DES dry eye syndrome; MGD meibomian gland dysfunction; ATs artificial tears; PFAT's preservative free artificial tears; NSC nuclear sclerotic cataract; PSC posterior subcapsular cataract; ERM epi-retinal membrane; PVD posterior vitreous detachment; RD retinal detachment; DM diabetes mellitus; DR diabetic retinopathy; NPDR non-proliferative diabetic retinopathy; PDR proliferative diabetic retinopathy; CSME clinically significant macular edema; DME diabetic macular edema; dbh dot blot hemorrhages; CWS cotton wool spot; POAG primary open angle glaucoma; C/D cup-to-disc ratio; HVF humphrey visual field; GVF goldmann visual field; OCT optical coherence tomography; IOP intraocular pressure; BRVO Branch retinal vein occlusion; CRVO central retinal vein occlusion; CRAO central retinal artery occlusion; BRAO branch retinal artery occlusion; RT retinal tear; SB scleral buckle; PPV pars plana vitrectomy; VH Vitreous hemorrhage; PRP panretinal laser photocoagulation; IVK intravitreal kenalog; VMT vitreomacular traction; MH Macular hole;  NVD neovascularization of the disc; NVE neovascularization elsewhere; AREDS age related eye disease study; ARMD age related macular degeneration; POAG primary open angle glaucoma; EBMD epithelial/anterior basement membrane dystrophy; ACIOL anterior chamber intraocular lens; IOL intraocular lens; PCIOL posterior chamber intraocular lens; Phaco/IOL phacoemulsification with intraocular lens placement; PRK photorefractive keratectomy; LASIK laser assisted in situ keratomileusis; HTN hypertension; DM diabetes mellitus; COPD chronic obstructive pulmonary disease

## 2023-07-13 ENCOUNTER — Encounter (INDEPENDENT_AMBULATORY_CARE_PROVIDER_SITE_OTHER): Payer: Self-pay | Admitting: Ophthalmology

## 2023-07-13 ENCOUNTER — Ambulatory Visit (INDEPENDENT_AMBULATORY_CARE_PROVIDER_SITE_OTHER): Payer: Medicare Other | Admitting: Ophthalmology

## 2023-07-13 DIAGNOSIS — H35373 Puckering of macula, bilateral: Secondary | ICD-10-CM

## 2023-07-13 DIAGNOSIS — H25813 Combined forms of age-related cataract, bilateral: Secondary | ICD-10-CM | POA: Diagnosis not present

## 2023-07-13 DIAGNOSIS — H3581 Retinal edema: Secondary | ICD-10-CM

## 2023-07-27 DIAGNOSIS — K080 Exfoliation of teeth due to systemic causes: Secondary | ICD-10-CM | POA: Diagnosis not present

## 2023-08-15 DIAGNOSIS — E291 Testicular hypofunction: Secondary | ICD-10-CM | POA: Diagnosis not present

## 2023-08-16 DIAGNOSIS — M170 Bilateral primary osteoarthritis of knee: Secondary | ICD-10-CM | POA: Diagnosis not present

## 2023-08-18 DIAGNOSIS — E291 Testicular hypofunction: Secondary | ICD-10-CM | POA: Diagnosis not present

## 2023-08-19 DIAGNOSIS — M170 Bilateral primary osteoarthritis of knee: Secondary | ICD-10-CM | POA: Diagnosis not present

## 2023-09-19 ENCOUNTER — Telehealth: Payer: Self-pay | Admitting: *Deleted

## 2023-09-19 NOTE — Telephone Encounter (Signed)
 Telephone call returned to patient and advised testosterone injections were not advised. Patient verbalized an understanding and will inform his urologist.

## 2023-09-19 NOTE — Telephone Encounter (Signed)
 Patient called asking if he can start testosterone injections per his Urologist at [facility].   He has low testosterone and would like to begin these injections as long as it would be ok with his history of clotting disorder and DVT's.

## 2023-09-21 ENCOUNTER — Telehealth: Payer: Self-pay | Admitting: *Deleted

## 2023-09-21 NOTE — Telephone Encounter (Signed)
 This RN returned call to pt to inform him of recommendation by MD review of concerns to not use testosterone per known hx of multiple DVTs/PEs.  Vernia Buff verbalized understanding and appreciation of response.

## 2023-10-04 NOTE — Progress Notes (Signed)
 [facility] - Clinic Note  10/18/2023   CHIEF COMPLAINT Patient presents for Retina Follow Up  HISTORY OF PRESENT ILLNESS: Phillip Hester is a 68 y.o. male who presents to the clinic today for:  HPI     Retina Follow Up   Patient presents with  Other.  In both eyes.  This started 3 months ago.  I, the attending physician,  performed the HPI with the patient and updated documentation appropriately.        Comments   Patient here for 3 months retina follow up for ERM OU. Patient states vision about the same. With new glasses RX thought would be bright and clear. But it is not. No eye pain.       Last edited by Ronelle Coffee, MD on 10/20/2023 11:17 PM.    Pt states his glasses Rx is not that good, he feels like he can see better out of dollar store glasses   Referring physician: Florance Hun, MD [address] [place],  [place] [address]  HISTORICAL INFORMATION:  Selected notes from the MEDICAL RECORD NUMBER Referred by Dr. Carloyn Chi for ERM OU LEE:  Ocular Hx- PMH-   CURRENT MEDICATIONS: No current outpatient medications on file. (Ophthalmic Drugs)   No current facility-administered medications for this visit. (Ophthalmic Drugs)   Current Outpatient Medications (Other)  Medication Sig   Ascorbic Acid  (VITAMIN C) 1000 MG tablet Take 3,000 mg by mouth daily.    buPROPion (WELLBUTRIN XL) 300 MG 24 hr tablet Take 1 tablet by mouth daily.   Cholecalciferol  (VITAMIN D3) 50 MCG (2000 UT) capsule Take 6,000 Units by mouth daily.   Cyanocobalamin  1000 MCG TBCR Take by mouth.   hydrocortisone (CORTEF) 5 MG tablet Take 5 mg by mouth every morning.   MAGNESIUM PO Take by mouth.   melatonin 3 MG TABS tablet Take 3 mg by mouth at bedtime as needed.   Omega-3-6-9 CAPS Take 1 capsule by mouth daily. 1300 mg   tamsulosin (FLOMAX) 0.4 MG CAPS capsule Take 0.4 mg by mouth daily.   traZODone (DESYREL) 100 MG tablet Take 100 mg by mouth at bedtime as needed for sleep.    zinc  gluconate 50 MG tablet Take 100 mg by mouth daily.   rivaroxaban  (XARELTO ) 20 MG TABS tablet Take 1 tablet (20 mg total) by mouth daily with supper. (Patient not taking: Reported on 10/18/2023)   testosterone  cypionate (DEPOTESTOTERONE CYPIONATE) 100 MG/ML injection Inject into the muscle once a week. For IM use only (Patient not taking: Reported on 07/13/2023)   No current facility-administered medications for this visit. (Other)   REVIEW OF SYSTEMS: ROS   Positive for: Cardiovascular, Eyes Last edited by Sylvan Evener, COA on 10/18/2023  2:46 PM.      ALLERGIES Allergies  Allergen Reactions   Bee Venom Anaphylaxis   PAST MEDICAL HISTORY Past Medical History:  Diagnosis Date   Arthritis    knees   Cancer (HCC)    bladder cancer   DVT (deep venous thrombosis) (HCC)    GERD (gastroesophageal reflux disease)    Heart murmur    diagnosed with "athlete's murmur" at 17   History of kidney stones    Hyperlipidemia    Pure hypercholesterolemia    Sleep apnea    CPAP at night   Past Surgical History:  Procedure Laterality Date   INGUINAL HERNIA REPAIR     IRRIGATION AND DEBRIDEMENT SEBACEOUS CYST     KNEE SURGERY Left 2013  PERIPHERAL VASCULAR BALLOON ANGIOPLASTY Right 03/23/2022   Procedure: PERIPHERAL VASCULAR BALLOON ANGIOPLASTY;  Surgeon: Margherita Shell, MD;  [facility]: [facility];  Service: Cardiovascular;  Laterality: Right;   PERIPHERAL VASCULAR THROMBECTOMY N/A 03/23/2022   Procedure: PERIPHERAL VASCULAR THROMBECTOMY;  Surgeon: Margherita Shell, MD;  Location: [facility];  Service: Cardiovascular;  Laterality: N/A;   SHOULDER SURGERY Right 2013   TOTAL HIP ARTHROPLASTY Right 11/20/2019   Procedure: RIGHT TOTAL HIP ARTHROPLASTY ANTERIOR APPROACH;  Surgeon: Arnie Lao, MD;  Location: [facility];  Service: Orthopedics;  Laterality: Right;   WISDOM TOOTH EXTRACTION     FAMILY HISTORY Family History  Problem Relation Age of Onset    Arthritis Mother    Dementia Father    Cancer Maternal Grandfather    Cancer Paternal Grandfather    SOCIAL HISTORY Social History   Tobacco Use   Smoking status: Never   Smokeless tobacco: Never  Vaping Use   Vaping status: Never Used  Substance Use Topics   Alcohol use: Yes    Alcohol/week: 2.0 - 3.0 standard drinks of alcohol    Types: 2 - 3 Standard drinks or equivalent per week    Comment: 2-3 drinks weekly   Drug use: No      OPHTHALMIC EXAM:  Base Eye Exam     Visual Acuity (Snellen - Linear)       Right Left   Dist cc 20/30 +2 20/20 -2    Correction: Glasses         Tonometry (Tonopen, 2:43 PM)       Right Left   Pressure 18 17         Pupils       Dark Light Shape React APD   Right 4 3 Round Brisk None   Left 4 3 Round Brisk None         Visual Fields (Counting fingers)       Left Right    Full Full         Extraocular Movement       Right Left    Full, Ortho Full, Ortho         Neuro/Psych     Oriented x3: Yes   Mood/Affect: Normal         Dilation     Both eyes: 1.0% Mydriacyl, 2.5% Phenylephrine  @ 2:43 PM           Slit Lamp and Fundus Exam     External Exam       Right Left   External Normal Normal         Slit Lamp Exam       Right Left   Lids/Lashes Dermatochalasis - upper lid Dermatochalasis - upper lid   Conjunctiva/Sclera White and quiet White and quiet   Cornea mild arcus mild arcus   Anterior Chamber deep and clear deep and clear   Iris Round and dilated Round and dilated   Lens 2+ Nuclear sclerosis, 2+ Cortical cataract 2+ Nuclear sclerosis, 2+ Cortical cataract   Anterior Vitreous mild syneresis, Posterior vitreous detachment, vitreous condensations mild syneresis, Posterior vitreous detachment, Weiss ring         Fundus Exam       Right Left   Disc Pink and Sharp, mild temporal PPA Pink and Sharp, temporal PPA   C/D Ratio 0.4 0.5   Macula Flat, Blunted foveal reflex, mild ERM, No  heme or edema Flat, Blunted foveal reflex, mild ERM,  No heme or edema   Vessels attenuated, mild tortuosity attenuated, mild tortuosity   Periphery Attached, peripheral drusen, No heme Attached, pigmented CR atrophy at 0530 and 0700, scattered peripheral drusen, No heme           Refraction     Wearing Rx       Sphere Cylinder Axis Add   Right +1.00 +0.50 139 +2.50   Left +1.00 +0.25 066 +2.50           IMAGING AND PROCEDURES  Imaging and Procedures for 10/18/2023  OCT, Retina - OU - Both Eyes        Right Eye Quality was good. Central Foveal Thickness: 502. Progression has been stable. Findings include no IRF, no SRF, abnormal foveal contour, epiretinal membrane, macular pucker (Persistent ERM with central thickening and mild pucker -- stable).   Left Eye Quality was good. Central Foveal Thickness: 344. Progression has been stable. Findings include no IRF, no SRF, abnormal foveal contour, epiretinal membrane (ERM with blunting of foveal contour and early pucker greatest temporal macula).   Notes  *Images captured and stored on drive  Diagnosis / Impression:  OD: Persistent ERM with central thickening and mild pucker -- stable OS: ERM with blunting of foveal contour and early pucker greatest temporal macula  Clinical management:  See below  Abbreviations: NFP - Normal foveal profile. CME - cystoid macular edema. PED - pigment epithelial detachment. IRF - intraretinal fluid. SRF - subretinal fluid. EZ - ellipsoid zone. ERM - epiretinal membrane. ORA - outer retinal atrophy. ORT - outer retinal tubulation. SRHM - subretinal hyper-reflective material. IRHM - intraretinal hyper-reflective material           ASSESSMENT/PLAN:   ICD-10-CM   1. Epiretinal membrane (ERM) of both eyes  H35.373 OCT, Retina - OU - Both Eyes    2. Combined forms of age-related cataract of both eyes  H25.813      Epiretinal membrane, both eyes  - mild ERM OU -- stable - OCT shows OD:  persistent ERM with central thickening and mild pucker greatest temporal macula; OS: ERM with blunting of foveal contour and early pucker - BCVA OD: 20/30, OS: 20/20 -- stable OU - mild metamorphopsia OD, OS asymptomatic - no indication for surgery at this time - monitor for now - f/u 3-6 mos -- DFE/OCT  2. Mixed Cataract OU - The symptoms of cataract, surgical options, and treatments and risks were discussed with patient. - discussed diagnosis and progression - monitor  Ophthalmic Meds Ordered this visit:  No orders of the defined types were placed in this encounter.    Return for f/u 3-6 months, ERM OU, DFE, OCT.  There are no Patient Instructions on file for this visit.  Explained the diagnoses, plan, and follow up with the patient and they expressed understanding.  Patient expressed understanding of the importance of proper follow up care.   This document serves as a record of services personally performed by Jeanice Millard, MD, PhD. It was created on their behalf by Olene Berne, COT an ophthalmic technician. The creation of this record is the provider's dictation and/or activities during the visit.    Electronically signed by:  Olene Berne, COT  10/20/23 11:24 PM  This document serves as a record of services personally performed by Jeanice Millard, MD, PhD. It was created on their behalf by Morley Arabia. Bevin Bucks, OA an ophthalmic technician. The creation of this record is the provider's dictation and/or activities  during the visit.    Electronically signed by: Morley Arabia. Bevin Bucks, OA 10/20/23 11:24 PM  Jeanice Millard, M.D., Ph.D. Diseases & Surgery of the Retina and Vitreous [facility] 10/18/2023  I have reviewed the above documentation for accuracy and completeness, and I agree with the above. Jeanice Millard, M.D., Ph.D. 10/20/23 11:27 PM   Abbreviations: M myopia (nearsighted); A astigmatism; H hyperopia (farsighted); P presbyopia; Mrx  spectacle prescription;  CTL contact lenses; OD right eye; OS left eye; OU both eyes  XT exotropia; ET esotropia; PEK punctate epithelial keratitis; PEE punctate epithelial erosions; DES dry eye syndrome; MGD meibomian gland dysfunction; ATs artificial tears; PFAT's preservative free artificial tears; NSC nuclear sclerotic cataract; PSC posterior subcapsular cataract; ERM epi-retinal membrane; PVD posterior vitreous detachment; RD retinal detachment; DM diabetes mellitus; DR diabetic retinopathy; NPDR non-proliferative diabetic retinopathy; PDR proliferative diabetic retinopathy; CSME clinically significant macular edema; DME diabetic macular edema; dbh dot blot hemorrhages; CWS cotton wool spot; POAG primary open angle glaucoma; C/D cup-to-disc ratio; HVF humphrey visual field; GVF goldmann visual field; OCT optical coherence tomography; IOP intraocular pressure; BRVO Branch retinal vein occlusion; CRVO central retinal vein occlusion; CRAO central retinal artery occlusion; BRAO branch retinal artery occlusion; RT retinal tear; SB scleral buckle; PPV pars plana vitrectomy; VH Vitreous hemorrhage; PRP panretinal laser photocoagulation; IVK intravitreal kenalog ; VMT vitreomacular traction; MH Macular hole;  NVD neovascularization of the disc; NVE neovascularization elsewhere; AREDS age related eye disease study; ARMD age related macular degeneration; POAG primary open angle glaucoma; EBMD epithelial/anterior basement membrane dystrophy; ACIOL anterior chamber intraocular lens; IOL intraocular lens; PCIOL posterior chamber intraocular lens; Phaco/IOL phacoemulsification with intraocular lens placement; PRK photorefractive keratectomy; LASIK laser assisted in situ keratomileusis; HTN hypertension; DM diabetes mellitus; COPD chronic obstructive pulmonary disease

## 2023-10-12 ENCOUNTER — Encounter (INDEPENDENT_AMBULATORY_CARE_PROVIDER_SITE_OTHER): Payer: Medicare Other | Admitting: Ophthalmology

## 2023-10-18 ENCOUNTER — Encounter (INDEPENDENT_AMBULATORY_CARE_PROVIDER_SITE_OTHER): Payer: Self-pay | Admitting: Ophthalmology

## 2023-10-18 ENCOUNTER — Ambulatory Visit (INDEPENDENT_AMBULATORY_CARE_PROVIDER_SITE_OTHER): Admitting: Ophthalmology

## 2023-10-18 DIAGNOSIS — H25813 Combined forms of age-related cataract, bilateral: Secondary | ICD-10-CM

## 2023-10-18 DIAGNOSIS — H35373 Puckering of macula, bilateral: Secondary | ICD-10-CM | POA: Diagnosis not present

## 2023-10-20 ENCOUNTER — Encounter (INDEPENDENT_AMBULATORY_CARE_PROVIDER_SITE_OTHER): Payer: Self-pay | Admitting: Ophthalmology

## 2023-11-25 ENCOUNTER — Other Ambulatory Visit: Payer: Self-pay | Admitting: Hematology and Oncology

## 2023-12-21 ENCOUNTER — Telehealth: Payer: Self-pay | Admitting: Hematology and Oncology

## 2023-12-21 NOTE — Telephone Encounter (Signed)
 Called patient multiple times to reschedule appointment. Received no answer mailbox full

## 2023-12-23 ENCOUNTER — Ambulatory Visit: Payer: Medicare Other | Admitting: Hematology and Oncology

## 2024-01-02 ENCOUNTER — Telehealth: Payer: Self-pay

## 2024-01-02 NOTE — Telephone Encounter (Signed)
 Patient confirmed but believed the apt was at 4:20 pm.

## 2024-01-03 ENCOUNTER — Inpatient Hospital Stay: Attending: Hematology and Oncology | Admitting: Hematology and Oncology

## 2024-03-15 NOTE — Progress Notes (Shared)
 [facility] - Clinic Note  03/21/2024   CHIEF COMPLAINT Patient presents for No chief complaint on file.  HISTORY OF PRESENT ILLNESS: Phillip Hester is a 68 y.o. male who presents to the clinic today for:   Pt states   Referring physician: Fleeta Zerita DASEN, MD [address] [place],  [place] [address]  HISTORICAL INFORMATION:  Selected notes from the MEDICAL RECORD NUMBER Referred by Dr. Fleeta for ERM OU LEE:  Ocular Hx- PMH-   CURRENT MEDICATIONS: No current outpatient medications on file. (Ophthalmic Drugs)   No current facility-administered medications for this visit. (Ophthalmic Drugs)   Current Outpatient Medications (Other)  Medication Sig   Ascorbic Acid  (VITAMIN C) 1000 MG tablet Take 3,000 mg by mouth daily.    buPROPion (WELLBUTRIN XL) 300 MG 24 hr tablet Take 1 tablet by mouth daily.   Cholecalciferol  (VITAMIN D3) 50 MCG (2000 UT) capsule Take 6,000 Units by mouth daily.   Cyanocobalamin  1000 MCG TBCR Take by mouth.   hydrocortisone (CORTEF) 5 MG tablet Take 5 mg by mouth every morning.   MAGNESIUM PO Take by mouth.   melatonin 3 MG TABS tablet Take 3 mg by mouth at bedtime as needed.   Omega-3-6-9 CAPS Take 1 capsule by mouth daily. 1300 mg   tamsulosin (FLOMAX) 0.4 MG CAPS capsule Take 0.4 mg by mouth daily.   testosterone  cypionate (DEPOTESTOTERONE CYPIONATE) 100 MG/ML injection Inject into the muscle once a week. For IM use only (Patient not taking: Reported on 07/13/2023)   traZODone (DESYREL) 100 MG tablet Take 100 mg by mouth at bedtime as needed for sleep.   XARELTO  20 MG TABS tablet TAKE 1 TABLET(20 MG) BY MOUTH DAILY WITH SUPPER   zinc  gluconate 50 MG tablet Take 100 mg by mouth daily.   No current facility-administered medications for this visit. (Other)   REVIEW OF SYSTEMS:    ALLERGIES Allergies  Allergen Reactions   Bee Venom Anaphylaxis   PAST MEDICAL HISTORY Past Medical History:  Diagnosis Date   Arthritis     knees   Cancer (HCC)    bladder cancer   DVT (deep venous thrombosis) (HCC)    GERD (gastroesophageal reflux disease)    Heart murmur    diagnosed with athlete's murmur at 17   History of kidney stones    Hyperlipidemia    Pure hypercholesterolemia    Sleep apnea    CPAP at night   Past Surgical History:  Procedure Laterality Date   INGUINAL HERNIA REPAIR     IRRIGATION AND DEBRIDEMENT SEBACEOUS CYST     KNEE SURGERY Left 2013   PERIPHERAL VASCULAR BALLOON ANGIOPLASTY Right 03/23/2022   Procedure: PERIPHERAL VASCULAR BALLOON ANGIOPLASTY;  Surgeon: Serene Gaile ORN, MD;  Location: [facility];  Service: Cardiovascular;  Laterality: Right;   PERIPHERAL VASCULAR THROMBECTOMY N/A 03/23/2022   Procedure: PERIPHERAL VASCULAR THROMBECTOMY;  Surgeon: Serene Gaile ORN, MD;  Location: [facility];  Service: Cardiovascular;  Laterality: N/A;   SHOULDER SURGERY Right 2013   TOTAL HIP ARTHROPLASTY Right 11/20/2019   Procedure: RIGHT TOTAL HIP ARTHROPLASTY ANTERIOR APPROACH;  Surgeon: Vernetta Lonni GRADE, MD;  Location: [facility];  Service: Orthopedics;  Laterality: Right;   WISDOM TOOTH EXTRACTION     FAMILY HISTORY Family History  Problem Relation Age of Onset   Arthritis Mother    Dementia Father    Cancer Maternal Grandfather    Cancer Paternal Grandfather    SOCIAL HISTORY Social History  Tobacco Use   Smoking status: Never   Smokeless tobacco: Never  Vaping Use   Vaping status: Never Used  Substance Use Topics   Alcohol use: Yes    Alcohol/week: 2.0 - 3.0 standard drinks of alcohol    Types: 2 - 3 Standard drinks or equivalent per week    Comment: 2-3 drinks weekly   Drug use: No      OPHTHALMIC EXAM:  Not recorded    IMAGING AND PROCEDURES  Imaging and Procedures for 03/21/2024         ASSESSMENT/PLAN:   ICD-10-CM   1. Epiretinal membrane (ERM) of both eyes  H35.373     2. Combined forms of age-related cataract of both eyes  H25.813       Epiretinal membrane, both eyes  - mild ERM OU -- stable - OCT shows OD: persistent ERM with central thickening and mild pucker greatest temporal macula; OS: ERM with blunting of foveal contour and early pucker - BCVA OD: 20/30, OS: 20/20 -- stable OU - mild metamorphopsia OD, OS asymptomatic - no indication for surgery at this time - monitor for now - f/u 3-6 mos -- DFE/OCT  2. Mixed Cataract OU - The symptoms of cataract, surgical options, and treatments and risks were discussed with patient. - discussed diagnosis and progression - monitor  Ophthalmic Meds Ordered this visit:  No orders of the defined types were placed in this encounter.    No follow-ups on file.  There are no Patient Instructions on file for this visit.  Explained the diagnoses, plan, and follow up with the patient and they expressed understanding.  Patient expressed understanding of the importance of proper follow up care.   This document serves as a record of services personally performed by Redell JUDITHANN Hans, MD, PhD. It was created on their behalf by Almetta Pesa, an ophthalmic technician. The creation of this record is the provider's dictation and/or activities during the visit.    Electronically signed by: Almetta Pesa, OA, 03/21/24  7:41 AM  This document serves as a record of services personally performed by Redell JUDITHANN Hans, MD, PhD. It was created on their behalf by Wanda GEANNIE Keens, COT an ophthalmic technician. The creation of this record is the provider's dictation and/or activities during the visit.    Electronically signed by:  Wanda GEANNIE Keens, COT  03/21/24 7:41 AM  Redell JUDITHANN Hans, M.D., Ph.D. Diseases & Surgery of the Retina and Vitreous [facility] 03/21/2024    Abbreviations: M myopia (nearsighted); A astigmatism; H hyperopia (farsighted); P presbyopia; Mrx spectacle prescription;  CTL contact lenses; OD right eye; OS left eye; OU both eyes  XT  exotropia; ET esotropia; PEK punctate epithelial keratitis; PEE punctate epithelial erosions; DES dry eye syndrome; MGD meibomian gland dysfunction; ATs artificial tears; PFAT's preservative free artificial tears; NSC nuclear sclerotic cataract; PSC posterior subcapsular cataract; ERM epi-retinal membrane; PVD posterior vitreous detachment; RD retinal detachment; DM diabetes mellitus; DR diabetic retinopathy; NPDR non-proliferative diabetic retinopathy; PDR proliferative diabetic retinopathy; CSME clinically significant macular edema; DME diabetic macular edema; dbh dot blot hemorrhages; CWS cotton wool spot; POAG primary open angle glaucoma; C/D cup-to-disc ratio; HVF humphrey visual field; GVF goldmann visual field; OCT optical coherence tomography; IOP intraocular pressure; BRVO Branch retinal vein occlusion; CRVO central retinal vein occlusion; CRAO central retinal artery occlusion; BRAO branch retinal artery occlusion; RT retinal tear; SB scleral buckle; PPV pars plana vitrectomy; VH Vitreous hemorrhage; PRP panretinal laser photocoagulation; IVK intravitreal  kenalog ; VMT vitreomacular traction; MH Macular hole;  NVD neovascularization of the disc; NVE neovascularization elsewhere; AREDS age related eye disease study; ARMD age related macular degeneration; POAG primary open angle glaucoma; EBMD epithelial/anterior basement membrane dystrophy; ACIOL anterior chamber intraocular lens; IOL intraocular lens; PCIOL posterior chamber intraocular lens; Phaco/IOL phacoemulsification with intraocular lens placement; PRK photorefractive keratectomy; LASIK laser assisted in situ keratomileusis; HTN hypertension; DM diabetes mellitus; COPD chronic obstructive pulmonary disease

## 2024-03-21 ENCOUNTER — Encounter (INDEPENDENT_AMBULATORY_CARE_PROVIDER_SITE_OTHER): Payer: Self-pay

## 2024-03-21 ENCOUNTER — Encounter (INDEPENDENT_AMBULATORY_CARE_PROVIDER_SITE_OTHER): Admitting: Ophthalmology

## 2024-03-21 DIAGNOSIS — H35373 Puckering of macula, bilateral: Secondary | ICD-10-CM

## 2024-03-21 DIAGNOSIS — H25813 Combined forms of age-related cataract, bilateral: Secondary | ICD-10-CM

## 2024-04-10 NOTE — Progress Notes (Signed)
 [facility] - Clinic Note  04/23/2024   CHIEF COMPLAINT Patient presents for Retina Follow Up  HISTORY OF PRESENT ILLNESS: Phillip Hester is a 68 y.o. male who presents to the clinic today for:  HPI     Retina Follow Up   In both eyes.  This started 6 months ago.  Duration of 6 months.  Since onset it is stable.  I, the attending physician,  performed the HPI with the patient and updated documentation appropriately.        Comments   6 month retina follow up ERM OU pt is reporting that his vision is blurred and needs cataract surgery he is having knee replacement in December 18th       Last edited by Valdemar Rogue, MD on 04/29/2024  2:36 AM.     Pt states he's having knee replacement surgery in December.   Referring physician: Fleeta Zerita DASEN, MD [address] [place],  [place] [address]  HISTORICAL INFORMATION:  Selected notes from the MEDICAL RECORD NUMBER Referred by Dr. Fleeta for ERM OU LEE:  Ocular Hx- PMH-   CURRENT MEDICATIONS: No current outpatient medications on file. (Ophthalmic Drugs)   No current facility-administered medications for this visit. (Ophthalmic Drugs)   Current Outpatient Medications (Other)  Medication Sig   Ascorbic Acid  (VITAMIN C) 1000 MG tablet Take 3,000 mg by mouth daily.    buPROPion (WELLBUTRIN XL) 300 MG 24 hr tablet Take 1 tablet by mouth daily.   Cholecalciferol  (VITAMIN D3) 50 MCG (2000 UT) capsule Take 6,000 Units by mouth daily.   Cyanocobalamin  1000 MCG TBCR Take by mouth.   hydrocortisone (CORTEF) 5 MG tablet Take 5 mg by mouth every morning.   MAGNESIUM PO Take by mouth.   melatonin 3 MG TABS tablet Take 3 mg by mouth at bedtime as needed.   Omega-3-6-9 CAPS Take 1 capsule by mouth daily. 1300 mg   tamsulosin (FLOMAX) 0.4 MG CAPS capsule Take 0.4 mg by mouth daily.   testosterone  cypionate (DEPOTESTOTERONE CYPIONATE) 100 MG/ML injection Inject into the muscle once a week. For IM use only    traZODone (DESYREL) 100 MG tablet Take 100 mg by mouth at bedtime as needed for sleep.   XARELTO  20 MG TABS tablet TAKE 1 TABLET(20 MG) BY MOUTH DAILY WITH SUPPER   zinc  gluconate 50 MG tablet Take 100 mg by mouth daily.   No current facility-administered medications for this visit. (Other)   REVIEW OF SYSTEMS: ROS   Positive for: Cardiovascular, Eyes Last edited by Resa Delon ORN, COT on 04/23/2024  2:58 PM.       ALLERGIES Allergies  Allergen Reactions   Bee Venom Anaphylaxis   PAST MEDICAL HISTORY Past Medical History:  Diagnosis Date   Arthritis    knees   Cancer (HCC)    bladder cancer   DVT (deep venous thrombosis) (HCC)    GERD (gastroesophageal reflux disease)    Heart murmur    diagnosed with athlete's murmur at 17   History of kidney stones    Hyperlipidemia    Pure hypercholesterolemia    Sleep apnea    CPAP at night   Past Surgical History:  Procedure Laterality Date   INGUINAL HERNIA REPAIR     IRRIGATION AND DEBRIDEMENT SEBACEOUS CYST     KNEE SURGERY Left 2013   PERIPHERAL VASCULAR BALLOON ANGIOPLASTY Right 03/23/2022   Procedure: PERIPHERAL VASCULAR BALLOON ANGIOPLASTY;  Surgeon: Serene Gaile ORN, MD;  [facility]: [facility];  Service: Cardiovascular;  Laterality: Right;   PERIPHERAL VASCULAR THROMBECTOMY N/A 03/23/2022   Procedure: PERIPHERAL VASCULAR THROMBECTOMY;  Surgeon: Serene Gaile ORN, MD;  Location: [facility];  Service: Cardiovascular;  Laterality: N/A;   SHOULDER SURGERY Right 2013   TOTAL HIP ARTHROPLASTY Right 11/20/2019   Procedure: RIGHT TOTAL HIP ARTHROPLASTY ANTERIOR APPROACH;  Surgeon: Vernetta Lonni GRADE, MD;  Location: [facility] OR;  Service: Orthopedics;  Laterality: Right;   WISDOM TOOTH EXTRACTION     FAMILY HISTORY Family History  Problem Relation Age of Onset   Arthritis Mother    Dementia Father    Cancer Maternal Grandfather    Cancer Paternal Grandfather    SOCIAL HISTORY Social History    Tobacco Use   Smoking status: Never   Smokeless tobacco: Never  Vaping Use   Vaping status: Never Used  Substance Use Topics   Alcohol use: Yes    Alcohol/week: 2.0 - 3.0 standard drinks of alcohol    Types: 2 - 3 Standard drinks or equivalent per week    Comment: 2-3 drinks weekly   Drug use: No      OPHTHALMIC EXAM:  Base Eye Exam     Visual Acuity (Snellen - Linear)       Right Left   Dist cc 20/40 -2 20/25 -3   Dist ph cc NI NI         Tonometry (Tonopen, 3:03 PM)       Right Left   Pressure 18 18         Pupils       Pupils Dark Light Shape React APD   Right PERRL 4 3 Round Brisk None   Left PERRL 4 3 Round Brisk None         Visual Fields       Left Right    Full Full         Extraocular Movement       Right Left    Full, Ortho Full, Ortho         Neuro/Psych     Oriented x3: Yes   Mood/Affect: Normal         Dilation     Both eyes: 2.5% Phenylephrine  @ 3:03 PM           Slit Lamp and Fundus Exam     External Exam       Right Left   External Normal Normal         Slit Lamp Exam       Right Left   Lids/Lashes Dermatochalasis - upper lid Dermatochalasis - upper lid   Conjunctiva/Sclera White and quiet White and quiet   Cornea mild arcus mild arcus   Anterior Chamber deep and clear deep and clear   Iris Round and dilated Round and dilated   Lens 2+ Nuclear sclerosis, 2+ Cortical cataract 2+ Nuclear sclerosis, 2+ Cortical cataract   Anterior Vitreous mild syneresis, Posterior vitreous detachment, vitreous condensations mild syneresis, Posterior vitreous detachment, Weiss ring         Fundus Exam       Right Left   Disc Pink and Sharp, mild temporal PPA Pink and Sharp, temporal PPA   C/D Ratio 0.4 0.5   Macula Flat, Blunted foveal reflex, mild ERM, No heme or edema Flat, Blunted foveal reflex, mild ERM, No heme or edema   Vessels attenuated, mild tortuosity attenuated, mild tortuosity   Periphery Attached,  peripheral drusen, No heme Attached, pigmented  CR atrophy at 0530 and 0700, scattered peripheral drusen, No heme           IMAGING AND PROCEDURES  Imaging and Procedures for 04/23/2024  OCT, Retina - OU - Both Eyes       Right Eye Quality was good. Central Foveal Thickness: 496. Progression has been stable. Findings include no IRF, no SRF, abnormal foveal contour, epiretinal membrane, macular pucker (Persistent ERM with central thickening and mild pucker -- stable).   Left Eye Quality was good. Central Foveal Thickness: 337. Progression has been stable. Findings include no IRF, no SRF, abnormal foveal contour, epiretinal membrane (ERM with blunting of foveal contour--slightly improved, early pucker greatest temporal macula).   Notes *Images captured and stored on drive  Diagnosis / Impression:  OD: Persistent ERM with central thickening and mild pucker -- stable OS: ERM with blunting of foveal contour--slightly improved, early pucker greatest temporal macula  Clinical management:  See below  Abbreviations: NFP - Normal foveal profile. CME - cystoid macular edema. PED - pigment epithelial detachment. IRF - intraretinal fluid. SRF - subretinal fluid. EZ - ellipsoid zone. ERM - epiretinal membrane. ORA - outer retinal atrophy. ORT - outer retinal tubulation. SRHM - subretinal hyper-reflective material. IRHM - intraretinal hyper-reflective material           ASSESSMENT/PLAN:   ICD-10-CM   1. Epiretinal membrane (ERM) of both eyes  H35.373 OCT, Retina - OU - Both Eyes    2. Combined forms of age-related cataract of both eyes  H25.813       Epiretinal membrane, both eyes  - mild ERM OU -- stable - OCT shows OD: persistent ERM with central thickening and mild pucker greatest temporal macula--stable; OS: ERM with blunting of foveal contour--slightly improved and early pucker - BCVA OD: decreased 20/40 from 20/30, OS: 20/25 down from 20/20 - mild metamorphopsia OD, OS  asymptomatic - no indication for surgery at this time - monitor for now - f/u 6 mos -- DFE/OCT  2. Mixed Cataract OU - The symptoms of cataract, surgical options, and treatments and risks were discussed with patient. - discussed diagnosis and progression - monitor--pt follows Dr. Vara see after knee surgery for cataract eval.   - clear from a retina standpoint to proceed with cataract surgery when pt and surgeon are ready   Ophthalmic Meds Ordered this visit:  No orders of the defined types were placed in this encounter.    Return in about 6 months (around 10/21/2024) for ERM OU, DFE, OCT.  There are no Patient Instructions on file for this visit.  Explained the diagnoses, plan, and follow up with the patient and they expressed understanding.  Patient expressed understanding of the importance of proper follow up care.   This document serves as a record of services personally performed by Redell JUDITHANN Hans, MD, PhD. It was created on their behalf by Avelina Pereyra, COA an ophthalmic technician. The creation of this record is the provider's dictation and/or activities during the visit.   Electronically signed by: Avelina GORMAN Pereyra, COT  04/29/24  2:39 AM   This document serves as a record of services personally performed by Redell JUDITHANN Hans, MD, PhD. It was created on their behalf by Wanda GEANNIE Keens, COT an ophthalmic technician. The creation of this record is the provider's dictation and/or activities during the visit.    Electronically signed by:  Wanda GEANNIE Keens, COT  04/29/24 2:39 AM  This document serves as a record of services personally performed  by Redell JUDITHANN Hans, MD, PhD. It was created on their behalf by Almetta Pesa, an ophthalmic technician. The creation of this record is the provider's dictation and/or activities during the visit.    Electronically signed by: Almetta Pesa, OA, 04/29/24  2:39 AM   Redell JUDITHANN Hans, M.D., Ph.D. Diseases & Surgery of the Retina  and Vitreous [facility] 04/23/2024  I have reviewed the above documentation for accuracy and completeness, and I agree with the above. Redell JUDITHANN Hans, M.D., Ph.D. 04/29/24 2:40 AM   Abbreviations: M myopia (nearsighted); A astigmatism; H hyperopia (farsighted); P presbyopia; Mrx spectacle prescription;  CTL contact lenses; OD right eye; OS left eye; OU both eyes  XT exotropia; ET esotropia; PEK punctate epithelial keratitis; PEE punctate epithelial erosions; DES dry eye syndrome; MGD meibomian gland dysfunction; ATs artificial tears; PFAT's preservative free artificial tears; NSC nuclear sclerotic cataract; PSC posterior subcapsular cataract; ERM epi-retinal membrane; PVD posterior vitreous detachment; RD retinal detachment; DM diabetes mellitus; DR diabetic retinopathy; NPDR non-proliferative diabetic retinopathy; PDR proliferative diabetic retinopathy; CSME clinically significant macular edema; DME diabetic macular edema; dbh dot blot hemorrhages; CWS cotton wool spot; POAG primary open angle glaucoma; C/D cup-to-disc ratio; HVF humphrey visual field; GVF goldmann visual field; OCT optical coherence tomography; IOP intraocular pressure; BRVO Branch retinal vein occlusion; CRVO central retinal vein occlusion; CRAO central retinal artery occlusion; BRAO branch retinal artery occlusion; RT retinal tear; SB scleral buckle; PPV pars plana vitrectomy; VH Vitreous hemorrhage; PRP panretinal laser photocoagulation; IVK intravitreal kenalog ; VMT vitreomacular traction; MH Macular hole;  NVD neovascularization of the disc; NVE neovascularization elsewhere; AREDS age related eye disease study; ARMD age related macular degeneration; POAG primary open angle glaucoma; EBMD epithelial/anterior basement membrane dystrophy; ACIOL anterior chamber intraocular lens; IOL intraocular lens; PCIOL posterior chamber intraocular lens; Phaco/IOL phacoemulsification with intraocular lens placement; PRK  photorefractive keratectomy; LASIK laser assisted in situ keratomileusis; HTN hypertension; DM diabetes mellitus; COPD chronic obstructive pulmonary disease

## 2024-04-23 ENCOUNTER — Ambulatory Visit (INDEPENDENT_AMBULATORY_CARE_PROVIDER_SITE_OTHER): Admitting: Ophthalmology

## 2024-04-23 ENCOUNTER — Encounter (INDEPENDENT_AMBULATORY_CARE_PROVIDER_SITE_OTHER): Payer: Self-pay | Admitting: Ophthalmology

## 2024-04-23 DIAGNOSIS — H25813 Combined forms of age-related cataract, bilateral: Secondary | ICD-10-CM

## 2024-04-23 DIAGNOSIS — H35373 Puckering of macula, bilateral: Secondary | ICD-10-CM | POA: Diagnosis not present

## 2024-04-26 ENCOUNTER — Other Ambulatory Visit: Payer: Self-pay | Admitting: Surgery

## 2024-04-26 ENCOUNTER — Telehealth: Payer: Self-pay

## 2024-04-26 ENCOUNTER — Ambulatory Visit (HOSPITAL_COMMUNITY)
Admission: RE | Admit: 2024-04-26 | Discharge: 2024-04-26 | Disposition: A | Discharge status: Home / Self Care | Source: Ambulatory Visit | Attending: Surgery | Admitting: Surgery

## 2024-04-26 DIAGNOSIS — M79604 Pain in right leg: Secondary | ICD-10-CM | POA: Diagnosis present

## 2024-04-26 DIAGNOSIS — M7989 Other specified soft tissue disorders: Secondary | ICD-10-CM

## 2024-04-26 NOTE — Telephone Encounter (Signed)
 Pt called c/o right calf pain and swelling that started 3 days ago. Patient reports he has not missed Xarelto  doses but had 100 mg Testosterone  injection on 04/18/24. Pt has Hx of DVT and PE.  Will schedule DVT US  for patient.  04/27/24 1313  Followed up with patient after negative DVT US .  Patient reported he's wearing compression stocking his right leg and it seems to be helping.  Will contact the office if he has further questions.

## 2024-04-29 ENCOUNTER — Encounter (INDEPENDENT_AMBULATORY_CARE_PROVIDER_SITE_OTHER): Payer: Self-pay | Admitting: Ophthalmology

## 2024-05-25 ENCOUNTER — Telehealth: Payer: Self-pay

## 2024-05-25 NOTE — Telephone Encounter (Signed)
 Patient called asking about hold time for his Xarelto  prior to upcoming TKR on 12/18.    Advised our protocol for OR procedures is a hold time of 3 days.   Advised patient to check with orthopedic MD re: this hold time to see if he is agreeable to it.  If not, ortho will need to decide if surgery is safe from his standpoint.

## 2024-06-11 ENCOUNTER — Ambulatory Visit

## 2024-06-11 DIAGNOSIS — Z4789 Encounter for other orthopedic aftercare: Secondary | ICD-10-CM | POA: Insufficient documentation

## 2024-06-11 DIAGNOSIS — M25662 Stiffness of left knee, not elsewhere classified: Secondary | ICD-10-CM | POA: Diagnosis present

## 2024-06-11 DIAGNOSIS — M25562 Pain in left knee: Secondary | ICD-10-CM | POA: Insufficient documentation

## 2024-06-11 DIAGNOSIS — Z96652 Presence of left artificial knee joint: Secondary | ICD-10-CM | POA: Diagnosis present

## 2024-06-11 NOTE — Therapy (Signed)
 " OUTPATIENT PHYSICAL THERAPY LOWER EXTREMITY EVALUATION   [name] MRN: [id number] DOB:02-22-1956, 68 y.o., male Today's Date: 06/11/2024  END OF SESSION:  PT End of Session - 06/11/24 1202     Visit Number 1    Date for Recertification  09/03/24    Authorization Type BCBS Medicare    PT Start Time 1100    PT Stop Time 1145    PT Time Calculation (min) 45 min    Activity Tolerance Patient tolerated treatment well    Behavior During Therapy WFL for tasks assessed/performed         Past Medical History:  Diagnosis Date   Arthritis    knees   Cancer (HCC)    bladder cancer   DVT (deep venous thrombosis) (HCC)    GERD (gastroesophageal reflux disease)    Heart murmur    diagnosed with athlete's murmur at 17   History of kidney stones    Hyperlipidemia    Pure hypercholesterolemia    Sleep apnea    CPAP at night   Past Surgical History:  Procedure Laterality Date   INGUINAL HERNIA REPAIR     IRRIGATION AND DEBRIDEMENT SEBACEOUS CYST     KNEE SURGERY Left 2013   PERIPHERAL VASCULAR BALLOON ANGIOPLASTY Right 03/23/2022   Procedure: PERIPHERAL VASCULAR BALLOON ANGIOPLASTY;  Surgeon: Serene Gaile ORN, MD;  Location: [facility];  Service: Cardiovascular;  Laterality: Right;   PERIPHERAL VASCULAR THROMBECTOMY N/A 03/23/2022   Procedure: PERIPHERAL VASCULAR THROMBECTOMY;  Surgeon: Serene Gaile ORN, MD;  Location: [facility];  Service: Cardiovascular;  Laterality: N/A;   SHOULDER SURGERY Right 2013   TOTAL HIP ARTHROPLASTY Right 11/20/2019   Procedure: RIGHT TOTAL HIP ARTHROPLASTY ANTERIOR APPROACH;  Surgeon: Vernetta Lonni GRADE, MD;  Location: [facility];  Service: Orthopedics;  Laterality: Right;   WISDOM TOOTH EXTRACTION     Patient Active Problem List   Diagnosis Date Noted   Obesity (BMI 30.0-34.9) 11/11/2022   Osteoarthritis of left glenohumeral joint 06/04/2020   Pulmonary embolism (HCC) 11/27/2019   OSA on CPAP    Status post  total replacement of right hip 11/20/2019   Unilateral primary osteoarthritis, right hip 07/18/2019   Right inguinal hernia 08/29/2018   Fatigue 08/13/2018   Hyperestrogenism 10/20/2017   Low testosterone  in male 10/20/2017   Vitamin B12 deficiency (non anemic) 10/20/2017   Encounter for screening colonoscopy 05/20/2017   Family history of malignant neoplasm of prostate 05/30/2015   Malignant neoplasm of lateral wall of urinary bladder (HCC) 05/30/2015   Elevated prostate specific antigen (PSA) 03/24/2015   Depression 08/03/2012   Low libido 05/03/2012   ED (erectile dysfunction) of organic origin 11/03/2011   Recurrent nephrolithiasis 11/03/2011   Renal cyst 11/03/2011   Cardiovascular risk factor 12/13/2010   Hyperlipidemia 10/18/2008    PCP: Roetta Blunt, MD  REFERRING PROVIDER: Rubie Kemps, MD  REFERRING DIAG:  Diagnosis  M17.12 (ICD-10-CM) - Unilateral primary osteoarthritis, left knee     Free Text Diagnosis  Lt TKA 12/18    THERAPY DIAG:  Surgical aftercare, musculoskeletal system  Acute pain of left knee  Decreased range of motion of left knee  Total knee replacement status, left  Rationale for Evaluation and Treatment: Rehabilitation  ONSET DATE: 05/31/24 (Procedure)   SUBJECTIVE:   SUBJECTIVE STATEMENT:  Pt confirmed surgery was performed 05/31/24. Pt states pain is at a 7.5/10 and has been consistently since surgery. Pt reports he has been icing his knee and has taken  some pain medication that has not been helping much. 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    PERTINENT HISTORY: See PMH chart above PAIN:  Are you having pain? Yes: NPRS scale: 7/10 Pain location: L knee Pain description: Discomfort Aggravating factors: Prolonged standing/walking Relieving factors: Rest  PRECAUTIONS: None  RED FLAGS: None   WEIGHT BEARING RESTRICTIONS: No  FALLS:  Has patient fallen in last 6 months?  No  LIVING ENVIRONMENT: Lives with: lives alone Lives in: House/apartment Stairs: Yes: Internal: 15 steps; on right going up Has following equipment at home: Vannie - 2 wheeled  OCCUPATION: Psychologist, sport and exercise   PLOF: Independent  PATIENT GOALS: Return to work, driving, and household responsibilities without increase in pain.   NEXT MD VISIT: 06/12/24 removal of stitches   OBJECTIVE:  Note: Objective measures were completed at Evaluation unless otherwise noted.  DIAGNOSTIC FINDINGS: Imaging ordered on 12/29- US  Venous Img Lower Unilateral Left (DVT)   PATIENT SURVEYS:  LEFS: Lower Extremity Functional Score: 10 / 80 = 12.5 %  COGNITION: Overall cognitive status: Within functional limits for tasks assessed     SENSATION: WFL   LOWER EXTREMITY ROM:  Active ROM Right eval Left eval  Hip flexion WNL WNL  Hip extension    Hip abduction    Hip adduction    Hip internal rotation    Hip external rotation    Knee flexion WNL 90  Knee extension WNL -25  Ankle dorsiflexion WNL   Ankle plantarflexion    Ankle inversion    Ankle eversion     (Blank rows = not tested)  LOWER EXTREMITY MMT:  MMT Right eval Left eval  Hip flexion WNL   Hip extension    Hip abduction    Hip adduction    Hip internal rotation    Hip external rotation    Knee flexion WNL 2/5  Knee extension WNL 2/5  Ankle dorsiflexion WNL   Ankle plantarflexion    Ankle inversion    Ankle eversion     (Blank rows = not tested)   FUNCTIONAL TESTS:   TUG: 20s  30s STS: Unable to do without use of UE  LEFS: Lower Extremity Functional Score: 10 / 80 = 12.5 %  GAIT: Distance walked: In Clinic  Assistive device utilized: Environmental Consultant - 2 wheeled Level of assistance: Complete Independence Comments: Decreased stance time on LLE during gait, decreased knee extension upon heel strike, use of UE on RW                                                                                                                                  TREATMENT DATE:   06/11/24- Eval    PATIENT EDUCATION:  Education details: HEP Person educated: Patient Education method: Medical Illustrator Education comprehension: verbalized understanding and returned demonstration  HOME EXERCISE PROGRAM:  Access Code: [email or web address] Date: 06/11/2024  Exercises - Seated Heel Slide  - 1 x daily - 7 x weekly - 2 sets - 10 reps - 3 hold - Supine Quad Set  - 1 x daily - 7 x weekly - 2 sets - 10 reps - 3 hold - Side to Side Weight Shift with Counter Support  - 1 x daily - 7 x weekly - 2 sets - 10 reps   ASSESSMENT:  CLINICAL IMPRESSION:  Patient is a 68 y.o. male who was seen today for physical therapy evaluation and treatment for LLE knee s/p TKA. S/p L TKA; procedure performed on 05/31/24. Upon eval pt is 1 week and 4 days post op. Pt with decreased ROM and decreased strength in LLE at the knee joint. Plan to follow post op protocal in order to regain full knee ROM and improve strength. Pt will benefit from PROM and AAROM in early stages to gradually progress mobility. Pt is currently using a RW and faces deficits in knee extension during initial heel strike phase of gait with decreased stance time on LLE. Pt has hx of DVT applies TED hose socks. Pt with higher pain levels since surgery plan to mitigate pain symptoms and gradually increase ROM and standing tasks.   OBJECTIVE IMPAIRMENTS: Abnormal gait, decreased endurance, decreased ROM, decreased strength, and pain.   ACTIVITY LIMITATIONS: carrying and lifting  PARTICIPATION LIMITATIONS: cleaning and laundry  REHAB POTENTIAL: Good  CLINICAL DECISION MAKING: Stable/uncomplicated  EVALUATION COMPLEXITY: Low   GOALS: Goals reviewed with patient? Yes  SHORT TERM GOALS: Target date: 07/16/24  Patient will be independent with initial HEP. Baseline:  Goal status: INITIAL  2. Pt will improve TUG score to <14s to show improved functional mobility.   Baseline: 20s with  RW   Goal Status: INITIAL    LONG TERM GOALS: Target date: 09/03/24  Patient will be independent with advanced/ongoing HEP to improve outcomes and carryover.  Baseline:  Goal status: INITIAL  2.  Patient will report at least 75% improvement in L knee pain to improve QOL. Baseline: 7.5/10 Goal status: INITIAL  3.  Patient will demonstrate improved active knee AROM to WNL to allow for normal gait and stair mechanics. Baseline:  Goal status: INITIAL  4.  Patient will demonstrate improved functional LE strength as demonstrated by 5/5 strength in LLE. Baseline: See Chart Goal status: INITIAL   5.  Patient will be able to ambulate 600' with LRAD and normal gait pattern without increased pain to access community.  Baseline:  Goal status: INITIAL  6. Patient will be able to ascend/descend stairs with 1 HR and reciprocal step pattern safely to access home and community.  Baseline:  Goal status: INITIAL  7.  Patient will report 20 point increase on LEFS to demonstrate improved functional ability. Baseline: 10 Goal status: INITIAL   PLAN:  PT FREQUENCY: 2x/week  PT DURATION: 10 weeks  PLANNED INTERVENTIONS: 97110-Therapeutic exercises, 97530- Therapeutic activity, [id number]- Neuromuscular re-education, 97535- Self Care, [id number]- Manual therapy, [phone]- Gait training, and Patient/Family education  PLAN FOR NEXT SESSION: PROM knee flex/ext, AAROM, increase standing tolerance, ice if needed    Thersia Alder, Student-PT 06/11/2024, 12:36 PM  "

## 2024-06-13 ENCOUNTER — Ambulatory Visit: Admitting: Physical Therapy

## 2024-06-13 ENCOUNTER — Encounter: Payer: Self-pay | Admitting: Physical Therapy

## 2024-06-13 DIAGNOSIS — M25662 Stiffness of left knee, not elsewhere classified: Secondary | ICD-10-CM

## 2024-06-13 DIAGNOSIS — Z4789 Encounter for other orthopedic aftercare: Secondary | ICD-10-CM | POA: Diagnosis not present

## 2024-06-13 DIAGNOSIS — Z96652 Presence of left artificial knee joint: Secondary | ICD-10-CM

## 2024-06-13 DIAGNOSIS — M25562 Pain in left knee: Secondary | ICD-10-CM

## 2024-06-13 NOTE — Therapy (Signed)
 " OUTPATIENT PHYSICAL THERAPY LOWER EXTREMITY TREATMENT   [name] MRN: [id number] DOB:07-Mar-1956, 68 y.o., male Today's Date: 06/13/2024  END OF SESSION:  PT End of Session - 06/13/24 1055     Visit Number 2    Date for Recertification  09/03/24    PT Start Time 1055    PT Stop Time 1140    PT Time Calculation (min) 45 min    Activity Tolerance Patient tolerated treatment well    Behavior During Therapy WFL for tasks assessed/performed         Past Medical History:  Diagnosis Date   Arthritis    knees   Cancer (HCC)    bladder cancer   DVT (deep venous thrombosis) (HCC)    GERD (gastroesophageal reflux disease)    Heart murmur    diagnosed with athlete's murmur at 17   History of kidney stones    Hyperlipidemia    Pure hypercholesterolemia    Sleep apnea    CPAP at night   Past Surgical History:  Procedure Laterality Date   INGUINAL HERNIA REPAIR     IRRIGATION AND DEBRIDEMENT SEBACEOUS CYST     KNEE SURGERY Left 2013   PERIPHERAL VASCULAR BALLOON ANGIOPLASTY Right 03/23/2022   Procedure: PERIPHERAL VASCULAR BALLOON ANGIOPLASTY;  Surgeon: Serene Gaile ORN, MD;  Location: [facility];  Service: Cardiovascular;  Laterality: Right;   PERIPHERAL VASCULAR THROMBECTOMY N/A 03/23/2022   Procedure: PERIPHERAL VASCULAR THROMBECTOMY;  Surgeon: Serene Gaile ORN, MD;  Location: [facility];  Service: Cardiovascular;  Laterality: N/A;   SHOULDER SURGERY Right 2013   TOTAL HIP ARTHROPLASTY Right 11/20/2019   Procedure: RIGHT TOTAL HIP ARTHROPLASTY ANTERIOR APPROACH;  Surgeon: Vernetta Lonni GRADE, MD;  Location: [facility];  Service: Orthopedics;  Laterality: Right;   WISDOM TOOTH EXTRACTION     Patient Active Problem List   Diagnosis Date Noted   Obesity (BMI 30.0-34.9) 11/11/2022   Osteoarthritis of left glenohumeral joint 06/04/2020   Pulmonary embolism (HCC) 11/27/2019   OSA on CPAP    Status post total replacement of right hip  11/20/2019   Unilateral primary osteoarthritis, right hip 07/18/2019   Right inguinal hernia 08/29/2018   Fatigue 08/13/2018   Hyperestrogenism 10/20/2017   Low testosterone  in male 10/20/2017   Vitamin B12 deficiency (non anemic) 10/20/2017   Encounter for screening colonoscopy 05/20/2017   Family history of malignant neoplasm of prostate 05/30/2015   Malignant neoplasm of lateral wall of urinary bladder (HCC) 05/30/2015   Elevated prostate specific antigen (PSA) 03/24/2015   Depression 08/03/2012   Low libido 05/03/2012   ED (erectile dysfunction) of organic origin 11/03/2011   Recurrent nephrolithiasis 11/03/2011   Renal cyst 11/03/2011   Cardiovascular risk factor 12/13/2010   Hyperlipidemia 10/18/2008    PCP: Roetta Blunt, MD  REFERRING PROVIDER: Rubie Kemps, MD  REFERRING DIAG:  Diagnosis  M17.12 (ICD-10-CM) - Unilateral primary osteoarthritis, left knee     Free Text Diagnosis  Lt TKA 12/18    THERAPY DIAG:  Acute pain of left knee  Decreased range of motion of left knee  Total knee replacement status, left  Rationale for Evaluation and Treatment: Rehabilitation  ONSET DATE: 05/31/24 (Procedure)   SUBJECTIVE:   SUBJECTIVE STATEMENT: Its been hurting like hell   Pt confirmed surgery was performed 05/31/24. Pt states pain is at a 7.5/10 and has been consistently since surgery. Pt reports he has been icing his knee and has taken some pain medication that has not  been helping much. 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    PERTINENT HISTORY: See PMH chart above PAIN:  Are you having pain? Yes: NPRS scale: 7/10 Pain location: L knee Pain description: Discomfort Aggravating factors: Prolonged standing/walking Relieving factors: Rest  PRECAUTIONS: None  RED FLAGS: None   WEIGHT BEARING RESTRICTIONS: No  FALLS:  Has patient fallen in last 6 months? No  LIVING ENVIRONMENT: Lives with: lives  alone Lives in: House/apartment Stairs: Yes: Internal: 15 steps; on right going up Has following equipment at home: Vannie - 2 wheeled  OCCUPATION: Psychologist, sport and exercise   PLOF: Independent  PATIENT GOALS: Return to work, driving, and household responsibilities without increase in pain.   NEXT MD VISIT: 06/12/24 removal of stitches   OBJECTIVE:  Note: Objective measures were completed at Evaluation unless otherwise noted.  DIAGNOSTIC FINDINGS: Imaging ordered on 12/29- US  Venous Img Lower Unilateral Left (DVT)   PATIENT SURVEYS:  LEFS: Lower Extremity Functional Score: 10 / 80 = 12.5 %  COGNITION: Overall cognitive status: Within functional limits for tasks assessed     SENSATION: WFL   LOWER EXTREMITY ROM:  Active ROM Right eval Left eval  Hip flexion WNL WNL  Hip extension    Hip abduction    Hip adduction    Hip internal rotation    Hip external rotation    Knee flexion WNL 90  Knee extension WNL -25  Ankle dorsiflexion WNL   Ankle plantarflexion    Ankle inversion    Ankle eversion     (Blank rows = not tested)  LOWER EXTREMITY MMT:  MMT Right eval Left eval  Hip flexion WNL   Hip extension    Hip abduction    Hip adduction    Hip internal rotation    Hip external rotation    Knee flexion WNL 2/5  Knee extension WNL 2/5  Ankle dorsiflexion WNL   Ankle plantarflexion    Ankle inversion    Ankle eversion     (Blank rows = not tested)   FUNCTIONAL TESTS:   TUG: 20s  30s STS: Unable to do without use of UE  LEFS: Lower Extremity Functional Score: 10 / 80 = 12.5 %  GAIT: Distance walked: In Clinic  Assistive device utilized: Environmental Consultant - 2 wheeled Level of assistance: Complete Independence Comments: Decreased stance time on LLE during gait, decreased knee extension upon heel strike, use of UE on RW                                                                                                                                 TREATMENT DATE:   06/13/24 L knee PROM w/ end range holds  Patellar mobs  Tibiofemoral  Jt mobs  LLE HS curls red 2x10  LLE LAQ 2x10 Sit to stands 2x10 from elevated mat  NuStep L5 x 6 min Standing march Leg press  40lb 2x10  06/11/24- Eval    PATIENT EDUCATION:  Education details: HEP Person educated: Patient Education method: Medical Illustrator Education comprehension: verbalized understanding and returned demonstration  HOME EXERCISE PROGRAM:  Access Code: [email or web address] Date: 06/11/2024 Exercises - Seated Heel Slide  - 1 x daily - 7 x weekly - 2 sets - 10 reps - 3 hold - Supine Quad Set  - 1 x daily - 7 x weekly - 2 sets - 10 reps - 3 hold - Side to Side Weight Shift with Counter Support  - 1 x daily - 7 x weekly - 2 sets - 10 reps   ASSESSMENT:  CLINICAL IMPRESSION:  Patient is a 68 y.o. male who was seen today for physical therapy treatment for LLE knee s/p TKA. S/p L TKA.  Pt with decreased ROM and decreased strength in LLE at the knee joint.  Pt is currently using a RW and faces deficits in knee extension during initial heel strike phase of gait with decreased stance time on LLE. Session completed without RW. Strong cues needed to achieve L quad contraction at end range. End range pain with PROM.  Pt will benefit from PROM and AAROM in early stages to gradually progress mobility.  Pt has hx of DVT applies TED hose socks.   OBJECTIVE IMPAIRMENTS: Abnormal gait, decreased endurance, decreased ROM, decreased strength, and pain.   ACTIVITY LIMITATIONS: carrying and lifting  PARTICIPATION LIMITATIONS: cleaning and laundry  REHAB POTENTIAL: Good  CLINICAL DECISION MAKING: Stable/uncomplicated  EVALUATION COMPLEXITY: Low   GOALS: Goals reviewed with patient? Yes  SHORT TERM GOALS: Target date: 07/16/24  Patient will be independent with initial HEP. Baseline:  Goal status: INITIAL  2. Pt will improve TUG score to <14s to show improved functional mobility.   Baseline: 20s with  RW   Goal Status: INITIAL    LONG TERM GOALS: Target date: 09/03/24  Patient will be independent with advanced/ongoing HEP to improve outcomes and carryover.  Baseline:  Goal status: INITIAL  2.  Patient will report at least 75% improvement in L knee pain to improve QOL. Baseline: 7.5/10 Goal status: INITIAL  3.  Patient will demonstrate improved active knee AROM to WNL to allow for normal gait and stair mechanics. Baseline:  Goal status: INITIAL  4.  Patient will demonstrate improved functional LE strength as demonstrated by 5/5 strength in LLE. Baseline: See Chart Goal status: INITIAL   5.  Patient will be able to ambulate 600' with LRAD and normal gait pattern without increased pain to access community.  Baseline:  Goal status: INITIAL  6. Patient will be able to ascend/descend stairs with 1 HR and reciprocal step pattern safely to access home and community.  Baseline:  Goal status: INITIAL  7.  Patient will report 20 point increase on LEFS to demonstrate improved functional ability. Baseline: 10 Goal status: INITIAL   PLAN:  PT FREQUENCY: 2x/week  PT DURATION: 10 weeks  PLANNED INTERVENTIONS: 97110-Therapeutic exercises, 97530- Therapeutic activity, 97112- Neuromuscular re-education, 97535- Self Care, [id number]- Manual therapy, [phone]- Gait training, and Patient/Family education  PLAN FOR NEXT SESSION: PROM knee flex/ext, AAROM, increase standing tolerance, ice if needed    Tanda KANDICE Sorrow, PTA 06/13/2024, 10:55 AM  "

## 2024-06-18 ENCOUNTER — Encounter: Payer: Self-pay | Admitting: Physical Therapy

## 2024-06-18 ENCOUNTER — Ambulatory Visit: Attending: Orthopedic Surgery | Admitting: Physical Therapy

## 2024-06-18 DIAGNOSIS — Z96652 Presence of left artificial knee joint: Secondary | ICD-10-CM | POA: Diagnosis present

## 2024-06-18 DIAGNOSIS — Z4789 Encounter for other orthopedic aftercare: Secondary | ICD-10-CM | POA: Diagnosis present

## 2024-06-18 DIAGNOSIS — M25562 Pain in left knee: Secondary | ICD-10-CM | POA: Insufficient documentation

## 2024-06-18 DIAGNOSIS — M25662 Stiffness of left knee, not elsewhere classified: Secondary | ICD-10-CM | POA: Insufficient documentation

## 2024-06-18 NOTE — Therapy (Signed)
 " OUTPATIENT PHYSICAL THERAPY LOWER EXTREMITY TREATMENT   [name] MRN: [id number] DOB:1955/08/12, 69 y.o., male Today's Date: 06/18/2024  END OF SESSION:  PT End of Session - 06/18/24 1058     Visit Number 3    Date for Recertification  09/03/24    PT Start Time 1100    PT Stop Time 1145    PT Time Calculation (min) 45 min         Past Medical History:  Diagnosis Date   Arthritis    knees   Cancer (HCC)    bladder cancer   DVT (deep venous thrombosis) (HCC)    GERD (gastroesophageal reflux disease)    Heart murmur    diagnosed with athlete's murmur at 17   History of kidney stones    Hyperlipidemia    Pure hypercholesterolemia    Sleep apnea    CPAP at night   Past Surgical History:  Procedure Laterality Date   INGUINAL HERNIA REPAIR     IRRIGATION AND DEBRIDEMENT SEBACEOUS CYST     KNEE SURGERY Left 2013   PERIPHERAL VASCULAR BALLOON ANGIOPLASTY Right 03/23/2022   Procedure: PERIPHERAL VASCULAR BALLOON ANGIOPLASTY;  Surgeon: Serene Gaile ORN, MD;  Location: [facility];  Service: Cardiovascular;  Laterality: Right;   PERIPHERAL VASCULAR THROMBECTOMY N/A 03/23/2022   Procedure: PERIPHERAL VASCULAR THROMBECTOMY;  Surgeon: Serene Gaile ORN, MD;  Location: [facility];  Service: Cardiovascular;  Laterality: N/A;   SHOULDER SURGERY Right 2013   TOTAL HIP ARTHROPLASTY Right 11/20/2019   Procedure: RIGHT TOTAL HIP ARTHROPLASTY ANTERIOR APPROACH;  Surgeon: Vernetta Lonni GRADE, MD;  Location: [facility];  Service: Orthopedics;  Laterality: Right;   WISDOM TOOTH EXTRACTION     Patient Active Problem List   Diagnosis Date Noted   Obesity (BMI 30.0-34.9) 11/11/2022   Osteoarthritis of left glenohumeral joint 06/04/2020   Pulmonary embolism (HCC) 11/27/2019   OSA on CPAP    Status post total replacement of right hip 11/20/2019   Unilateral primary osteoarthritis, right hip 07/18/2019   Right inguinal hernia 08/29/2018   Fatigue 08/13/2018    Hyperestrogenism 10/20/2017   Low testosterone  in male 10/20/2017   Vitamin B12 deficiency (non anemic) 10/20/2017   Encounter for screening colonoscopy 05/20/2017   Family history of malignant neoplasm of prostate 05/30/2015   Malignant neoplasm of lateral wall of urinary bladder (HCC) 05/30/2015   Elevated prostate specific antigen (PSA) 03/24/2015   Depression 08/03/2012   Low libido 05/03/2012   ED (erectile dysfunction) of organic origin 11/03/2011   Recurrent nephrolithiasis 11/03/2011   Renal cyst 11/03/2011   Cardiovascular risk factor 12/13/2010   Hyperlipidemia 10/18/2008    PCP: Roetta Blunt, MD  REFERRING PROVIDER: Rubie Kemps, MD  REFERRING DIAG:  Diagnosis  M17.12 (ICD-10-CM) - Unilateral primary osteoarthritis, left knee     Free Text Diagnosis  Lt TKA 12/18    THERAPY DIAG:  Decreased range of motion of left knee  Total knee replacement status, left  Acute pain of left knee  Rationale for Evaluation and Treatment: Rehabilitation  ONSET DATE: 05/31/24 (Procedure)   SUBJECTIVE:   SUBJECTIVE STATEMENT: Its hurting this morning,  been compliant with HEP, CPM, and bone foam  Pt confirmed surgery was performed 05/31/24. Pt states pain is at a 7.5/10 and has been consistently since surgery. Pt reports he has been icing his knee and has taken some pain medication that has not been helping much. 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    PERTINENT HISTORY: See PMH chart above PAIN:  Are you having pain? Yes: NPRS scale: 6/10 Pain location: L knee Pain description: Discomfort Aggravating factors: Prolonged standing/walking Relieving factors: Rest  PRECAUTIONS: None  RED FLAGS: None   WEIGHT BEARING RESTRICTIONS: No  FALLS:  Has patient fallen in last 6 months? No  LIVING ENVIRONMENT: Lives with: lives alone Lives in: House/apartment Stairs: Yes: Internal: 15 steps; on right going up Has  following equipment at home: Vannie - 2 wheeled  OCCUPATION: Psychologist, sport and exercise   PLOF: Independent  PATIENT GOALS: Return to work, driving, and household responsibilities without increase in pain.   NEXT MD VISIT: 06/12/24 removal of stitches   OBJECTIVE:  Note: Objective measures were completed at Evaluation unless otherwise noted.  DIAGNOSTIC FINDINGS: Imaging ordered on 12/29- US  Venous Img Lower Unilateral Left (DVT)   PATIENT SURVEYS:  LEFS: Lower Extremity Functional Score: 10 / 80 = 12.5 %  COGNITION: Overall cognitive status: Within functional limits for tasks assessed     SENSATION: WFL   LOWER EXTREMITY ROM:  Active ROM Right eval Left eval  Hip flexion WNL WNL  Hip extension    Hip abduction    Hip adduction    Hip internal rotation    Hip external rotation    Knee flexion WNL 90  Knee extension WNL -25  Ankle dorsiflexion WNL   Ankle plantarflexion    Ankle inversion    Ankle eversion     (Blank rows = not tested)  LOWER EXTREMITY MMT:  MMT Right eval Left eval  Hip flexion WNL   Hip extension    Hip abduction    Hip adduction    Hip internal rotation    Hip external rotation    Knee flexion WNL 2/5  Knee extension WNL 2/5  Ankle dorsiflexion WNL   Ankle plantarflexion    Ankle inversion    Ankle eversion     (Blank rows = not tested)   FUNCTIONAL TESTS:   TUG: 20s  30s STS: Unable to do without use of UE  LEFS: Lower Extremity Functional Score: 10 / 80 = 12.5 %  GAIT: Distance walked: In Clinic  Assistive device utilized: Environmental Consultant - 2 wheeled Level of assistance: Complete Independence Comments: Decreased stance time on LLE during gait, decreased knee extension upon heel strike, use of UE on RW                                                                                                                                 TREATMENT DATE:  06/19/23 NuStep L5 x 6 min Sit to stand 2x10 w/ progressive lowering of mat TUG 8.40 sec L  knee PROM w/ end range holds  Patellar mobs  Tibiofemoral  Jt mobs HS curls green LLE 2x10 LAQ LLE 2lb 3x10 Leg press 40lb 2x10  LLE 20lb 2x5 TKE Slant board calf stretch  06/13/24 L knee PROM w/ end range holds  Patellar mobs  Tibiofemoral  Jt mobs  LLE HS curls red 2x10  LLE LAQ 2x10 Sit to stands 2x10 from elevated mat  NuStep L5 x 6 min Standing march Leg press 40lb 2x10  06/11/24- Eval    PATIENT EDUCATION:  Education details: HEP Person educated: Patient Education method: Medical Illustrator Education comprehension: verbalized understanding and returned demonstration  HOME EXERCISE PROGRAM:  Access Code: [email or web address] Date: 06/11/2024 Exercises - Seated Heel Slide  - 1 x daily - 7 x weekly - 2 sets - 10 reps - 3 hold - Supine Quad Set  - 1 x daily - 7 x weekly - 2 sets - 10 reps - 3 hold - Side to Side Weight Shift with Counter Support  - 1 x daily - 7 x weekly - 2 sets - 10 reps   ASSESSMENT:  CLINICAL IMPRESSION:  Patient is a 69 y.o. male who was seen today for physical therapy treatment for LLE knee s/p TKA. S/p L TKA.  Pt with decreased ROM and decreased strength in LLE at the knee joint.  Pt enters ambulating with SPC in incorrect had, so he was corrected and given skilled instruction on proper SPC use. Session completed without AD.  End range pain with PROM, despite soft end feel for flexion. Progressed to LE strengthening without issue. Cue for eccentric control with leg press.  Pt will benefit from PROM and AAROM in early stages to gradually progress mobility.  Pt has hx of DVT applies TED hose socks.   OBJECTIVE IMPAIRMENTS: Abnormal gait, decreased endurance, decreased ROM, decreased strength, and pain.   ACTIVITY LIMITATIONS: carrying and lifting  PARTICIPATION LIMITATIONS: cleaning and laundry  REHAB POTENTIAL: Good  CLINICAL DECISION MAKING: Stable/uncomplicated  EVALUATION COMPLEXITY: Low   GOALS: Goals reviewed with patient?  Yes  SHORT TERM GOALS: Target date: 07/16/24  Patient will be independent with initial HEP. Baseline:  Goal status: Progressing 06/18/24  2. Pt will improve TUG score to <14s to show improved functional mobility.   Baseline: 20s with RW   Goal Status: Met 06/19/23     LONG TERM GOALS: Target date: 09/03/24  Patient will be independent with advanced/ongoing HEP to improve outcomes and carryover.  Baseline:  Goal status: INITIAL  2.  Patient will report at least 75% improvement in L knee pain to improve QOL. Baseline: 7.5/10 Goal status: INITIAL  3.  Patient will demonstrate improved active knee AROM to WNL to allow for normal gait and stair mechanics. Baseline:  Goal status: INITIAL  4.  Patient will demonstrate improved functional LE strength as demonstrated by 5/5 strength in LLE. Baseline: See Chart Goal status: INITIAL   5.  Patient will be able to ambulate 600' with LRAD and normal gait pattern without increased pain to access community.  Baseline:  Goal status: INITIAL  6. Patient will be able to ascend/descend stairs with 1 HR and reciprocal step pattern safely to access home and community.  Baseline:  Goal status: INITIAL  7.  Patient will report 20 point increase on LEFS to demonstrate improved functional ability. Baseline: 10 Goal status: INITIAL   PLAN:  PT FREQUENCY: 2x/week  PT DURATION: 10 weeks  PLANNED INTERVENTIONS: 97110-Therapeutic exercises, 97530- Therapeutic activity, [id number]- Neuromuscular re-education, 97535- Self Care, [id number]- Manual therapy, [phone]- Gait training, and Patient/Family education  PLAN FOR NEXT SESSION: PROM knee flex/ext, AAROM, increase standing tolerance, ice if needed    Tanda KANDICE Sorrow, PTA 06/18/2024,  10:59 AM  "

## 2024-06-21 ENCOUNTER — Ambulatory Visit

## 2024-06-21 DIAGNOSIS — M25662 Stiffness of left knee, not elsewhere classified: Secondary | ICD-10-CM

## 2024-06-21 DIAGNOSIS — Z4789 Encounter for other orthopedic aftercare: Secondary | ICD-10-CM

## 2024-06-21 DIAGNOSIS — Z96652 Presence of left artificial knee joint: Secondary | ICD-10-CM

## 2024-06-21 DIAGNOSIS — M25562 Pain in left knee: Secondary | ICD-10-CM

## 2024-06-21 NOTE — Therapy (Signed)
 " OUTPATIENT PHYSICAL THERAPY LOWER EXTREMITY TREATMENT   [name] MRN: [id number] DOB:Nov 07, 1955, 69 y.o., male Today's Date: 06/21/2024  END OF SESSION:  PT End of Session - 06/21/24 1144     Visit Number 4    Date for Recertification  09/03/24    Authorization Type [employer] Medicare    PT Start Time 1100    PT Stop Time 1142    PT Time Calculation (min) 42 min    Activity Tolerance Patient tolerated treatment well    Behavior During Therapy WFL for tasks assessed/performed          Past Medical History:  Diagnosis Date   Arthritis    knees   Cancer (HCC)    bladder cancer   DVT (deep venous thrombosis) (HCC)    GERD (gastroesophageal reflux disease)    Heart murmur    diagnosed with athlete's murmur at 17   History of kidney stones    Hyperlipidemia    Pure hypercholesterolemia    Sleep apnea    CPAP at night   Past Surgical History:  Procedure Laterality Date   INGUINAL HERNIA REPAIR     IRRIGATION AND DEBRIDEMENT SEBACEOUS CYST     KNEE SURGERY Left 2013   PERIPHERAL VASCULAR BALLOON ANGIOPLASTY Right 03/23/2022   Procedure: PERIPHERAL VASCULAR BALLOON ANGIOPLASTY;  Surgeon: Serene Gaile ORN, MD;  Location: [facility];  Service: Cardiovascular;  Laterality: Right;   PERIPHERAL VASCULAR THROMBECTOMY N/A 03/23/2022   Procedure: PERIPHERAL VASCULAR THROMBECTOMY;  Surgeon: Serene Gaile ORN, MD;  Location: [facility];  Service: Cardiovascular;  Laterality: N/A;   SHOULDER SURGERY Right 2013   TOTAL HIP ARTHROPLASTY Right 11/20/2019   Procedure: RIGHT TOTAL HIP ARTHROPLASTY ANTERIOR APPROACH;  Surgeon: Vernetta Lonni GRADE, MD;  Location: [facility];  Service: Orthopedics;  Laterality: Right;   WISDOM TOOTH EXTRACTION     Patient Active Problem List   Diagnosis Date Noted   Obesity (BMI 30.0-34.9) 11/11/2022   Osteoarthritis of left glenohumeral joint 06/04/2020   Pulmonary embolism (HCC) 11/27/2019   OSA on CPAP    Status post  total replacement of right hip 11/20/2019   Unilateral primary osteoarthritis, right hip 07/18/2019   Right inguinal hernia 08/29/2018   Fatigue 08/13/2018   Hyperestrogenism 10/20/2017   Low testosterone  in male 10/20/2017   Vitamin B12 deficiency (non anemic) 10/20/2017   Encounter for screening colonoscopy 05/20/2017   Family history of malignant neoplasm of prostate 05/30/2015   Malignant neoplasm of lateral wall of urinary bladder (HCC) 05/30/2015   Elevated prostate specific antigen (PSA) 03/24/2015   Depression 08/03/2012   Low libido 05/03/2012   ED (erectile dysfunction) of organic origin 11/03/2011   Recurrent nephrolithiasis 11/03/2011   Renal cyst 11/03/2011   Cardiovascular risk factor 12/13/2010   Hyperlipidemia 10/18/2008    PCP: Roetta Blunt, MD  REFERRING PROVIDER: Rubie Kemps, MD  REFERRING DIAG:  Diagnosis  M17.12 (ICD-10-CM) - Unilateral primary osteoarthritis, left knee     Free Text Diagnosis  Lt TKA 12/18    THERAPY DIAG:  Decreased range of motion of left knee  Surgical aftercare, musculoskeletal system  Total knee replacement status, left  Acute pain of left knee  Rationale for Evaluation and Treatment: Rehabilitation  ONSET DATE: 05/31/24 (Procedure)   SUBJECTIVE:   SUBJECTIVE STATEMENT: Pt reported he has obtained 90d of flexion passively with machine use and uses bone foam about 15 min at a time to try and get knee extension. Pt overall  doing good today but has some stiffness in the knee joint.    Pt confirmed surgery was performed 05/31/24. Pt states pain is at a 7.5/10 and has been consistently since surgery. Pt reports he has been icing his knee and has taken some pain medication that has not been helping much. 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    PERTINENT HISTORY: See PMH chart above PAIN:  Are you having pain? Yes: NPRS scale: 6/10 Pain location: L knee Pain  description: Discomfort Aggravating factors: Prolonged standing/walking Relieving factors: Rest  PRECAUTIONS: None  RED FLAGS: None   WEIGHT BEARING RESTRICTIONS: No  FALLS:  Has patient fallen in last 6 months? No  LIVING ENVIRONMENT: Lives with: lives alone Lives in: House/apartment Stairs: Yes: Internal: 15 steps; on right going up Has following equipment at home: Vannie - 2 wheeled  OCCUPATION: Psychologist, sport and exercise   PLOF: Independent  PATIENT GOALS: Return to work, driving, and household responsibilities without increase in pain.   NEXT MD VISIT: 06/12/24 removal of stitches   OBJECTIVE:  Note: Objective measures were completed at Evaluation unless otherwise noted.  DIAGNOSTIC FINDINGS: Imaging ordered on 12/29- US  Venous Img Lower Unilateral Left (DVT)   PATIENT SURVEYS:  LEFS: Lower Extremity Functional Score: 10 / 80 = 12.5 %  COGNITION: Overall cognitive status: Within functional limits for tasks assessed     SENSATION: WFL   LOWER EXTREMITY ROM:  Active ROM Right eval Left eval  Hip flexion WNL WNL  Hip extension    Hip abduction    Hip adduction    Hip internal rotation    Hip external rotation    Knee flexion WNL 90  Knee extension WNL -25  Ankle dorsiflexion WNL   Ankle plantarflexion    Ankle inversion    Ankle eversion     (Blank rows = not tested)  LOWER EXTREMITY MMT:  MMT Right eval Left eval  Hip flexion WNL   Hip extension    Hip abduction    Hip adduction    Hip internal rotation    Hip external rotation    Knee flexion WNL 2/5  Knee extension WNL 2/5  Ankle dorsiflexion WNL   Ankle plantarflexion    Ankle inversion    Ankle eversion     (Blank rows = not tested)   FUNCTIONAL TESTS:   TUG: 20s  30s STS: Unable to do without use of UE  LEFS: Lower Extremity Functional Score: 10 / 80 = 12.5 %  GAIT: Distance walked: In Clinic  Assistive device utilized: Walker - 2 wheeled Level of assistance: Complete  Independence Comments: Decreased stance time on LLE during gait, decreased knee extension upon heel strike, use of UE on RW  TREATMENT DATE:  06/21/24 NuStep L5 x  STS 2x10 Step Ups 4 1x10 6 1x10 Lateral banded walking Rtband Ambulation- gait training 375' Quad Sets half foam 2x10  06/18/24 NuStep L5 x 6 min Sit to stand 2x10 w/ progressive lowering of mat TUG 8.40 sec L knee PROM w/ end range holds  Patellar mobs  Tibiofemoral  Jt mobs HS curls green LLE 2x10 LAQ LLE 2lb 3x10 Leg press 40lb 2x10  LLE 20lb 2x5 TKE Slant board calf stretch   06/13/24 L knee PROM w/ end range holds  Patellar mobs  Tibiofemoral  Jt mobs  LLE HS curls red 2x10  LLE LAQ 2x10 Sit to stands 2x10 from elevated mat  NuStep L5 x 6 min Standing march Leg press 40lb 2x10  06/11/24- Eval    PATIENT EDUCATION:  Education details: HEP Person educated: Patient Education method: Medical Illustrator Education comprehension: verbalized understanding and returned demonstration  HOME EXERCISE PROGRAM:  Access Code: [email or web address] Date: 06/11/2024 Exercises - Seated Heel Slide  - 1 x daily - 7 x weekly - 2 sets - 10 reps - 3 hold - Supine Quad Set  - 1 x daily - 7 x weekly - 2 sets - 10 reps - 3 hold - Side to Side Weight Shift with Counter Support  - 1 x daily - 7 x weekly - 2 sets - 10 reps   ASSESSMENT:  CLINICAL IMPRESSION:  Pt doing well with current regime. Pt with decreased knee extension and flexion which exhibits as decreased knee flexion of LLE in swing phase of gait and decreased heel strike upon initial contact. Pt with ability to ambulate short distances without AD use pt encouraged to increased standing tolerance and weight shifting activities. Pt will benefit from continued therapy to increase L knee ROM, improve gait quality, and strengthen the LLE.    Patient is a 69 y.o. male who was seen today for physical therapy treatment for LLE knee s/p TKA. S/p L TKA.  Pt with decreased ROM and decreased strength in LLE at the knee joint.  Pt enters ambulating with SPC in incorrect had, so he was corrected and given skilled instruction on proper SPC use. Session completed without AD.  End range pain with PROM, despite soft end feel for flexion. Progressed to LE strengthening without issue. Cue for eccentric control with leg press.  Pt will benefit from PROM and AAROM in early stages to gradually progress mobility.  Pt has hx of DVT applies TED hose socks.   OBJECTIVE IMPAIRMENTS: Abnormal gait, decreased endurance, decreased ROM, decreased strength, and pain.   ACTIVITY LIMITATIONS: carrying and lifting  PARTICIPATION LIMITATIONS: cleaning and laundry  REHAB POTENTIAL: Good  CLINICAL DECISION MAKING: Stable/uncomplicated  EVALUATION COMPLEXITY: Low   GOALS: Goals reviewed with patient? Yes  SHORT TERM GOALS: Target date: 07/16/24  Patient will be independent with initial HEP. Baseline:  Goal status: Progressing ~50% doing 06/21/24  2. Pt will improve TUG score to <14s to show improved functional mobility.   Baseline: 20s with RW   Goal Status: Met 06/19/23     LONG TERM GOALS: Target date: 09/03/24  Patient will be independent with advanced/ongoing HEP to improve outcomes and carryover.  Baseline:  Goal status: IN PROGRESS 06/21/24  2.  Patient will report at least 75% improvement in L knee pain to improve QOL. Baseline: 7.5/10 Goal status: IN PROGRESS 6/10 at worse 06/21/24  3.  Patient will demonstrate improved active knee AROM to WNL to allow  for normal gait and stair mechanics. Baseline:  Goal status: INITIAL -25-0-95 06/21/24  4.  Patient will demonstrate improved functional LE strength as demonstrated by 5/5 strength in LLE. Baseline: See Chart Goal status: IN PROGRESS 06/21/24  5.  Patient will be able to ambulate 600' with LRAD  and normal gait pattern without increased pain to access community.  Baseline:  Goal status: IN PROGRESS 06/21/24  6. Patient will be able to ascend/descend stairs with 1 HR and reciprocal step pattern safely to access home and community.  Baseline:  Goal status: IN PROGRESS 1 step at a time 06/21/24  7.  Patient will report 20 point increase on LEFS to demonstrate improved functional ability. Baseline: 10 Goal status: INITIAL   PLAN:  PT FREQUENCY: 2x/week  PT DURATION: 10 weeks  PLANNED INTERVENTIONS: 97110-Therapeutic exercises, 97530- Therapeutic activity, [id number]- Neuromuscular re-education, 97535- Self Care, [id number]- Manual therapy, [phone]- Gait training, and Patient/Family education  PLAN FOR NEXT SESSION: PROM knee flex/ext, AAROM, increase standing tolerance, ice if needed    [employer], Student-PT 06/21/2024, 11:45 AM  "

## 2024-06-25 ENCOUNTER — Ambulatory Visit: Admitting: Physical Therapy

## 2024-06-25 ENCOUNTER — Encounter: Payer: Self-pay | Admitting: Physical Therapy

## 2024-06-25 DIAGNOSIS — M25662 Stiffness of left knee, not elsewhere classified: Secondary | ICD-10-CM | POA: Diagnosis not present

## 2024-06-25 DIAGNOSIS — M25562 Pain in left knee: Secondary | ICD-10-CM

## 2024-06-25 DIAGNOSIS — Z96652 Presence of left artificial knee joint: Secondary | ICD-10-CM

## 2024-06-25 NOTE — Therapy (Signed)
 " OUTPATIENT PHYSICAL THERAPY LOWER EXTREMITY TREATMENT   [name] MRN: [id number] DOB:Dec 31, 1955, 69 y.o., male Today's Date: 06/25/2024  END OF SESSION:  PT End of Session - 06/25/24 1054     Visit Number 5    Date for Recertification  09/03/24    PT Start Time 1055    PT Stop Time 1140    PT Time Calculation (min) 45 min    Activity Tolerance Patient tolerated treatment well    Behavior During Therapy WFL for tasks assessed/performed          Past Medical History:  Diagnosis Date   Arthritis    knees   Cancer (HCC)    bladder cancer   DVT (deep venous thrombosis) (HCC)    GERD (gastroesophageal reflux disease)    Heart murmur    diagnosed with athlete's murmur at 17   History of kidney stones    Hyperlipidemia    Pure hypercholesterolemia    Sleep apnea    CPAP at night   Past Surgical History:  Procedure Laterality Date   INGUINAL HERNIA REPAIR     IRRIGATION AND DEBRIDEMENT SEBACEOUS CYST     KNEE SURGERY Left 2013   PERIPHERAL VASCULAR BALLOON ANGIOPLASTY Right 03/23/2022   Procedure: PERIPHERAL VASCULAR BALLOON ANGIOPLASTY;  Surgeon: Serene Gaile ORN, MD;  Location: [facility];  Service: Cardiovascular;  Laterality: Right;   PERIPHERAL VASCULAR THROMBECTOMY N/A 03/23/2022   Procedure: PERIPHERAL VASCULAR THROMBECTOMY;  Surgeon: Serene Gaile ORN, MD;  Location: [facility];  Service: Cardiovascular;  Laterality: N/A;   SHOULDER SURGERY Right 2013   TOTAL HIP ARTHROPLASTY Right 11/20/2019   Procedure: RIGHT TOTAL HIP ARTHROPLASTY ANTERIOR APPROACH;  Surgeon: Vernetta Lonni GRADE, MD;  Location: [facility];  Service: Orthopedics;  Laterality: Right;   WISDOM TOOTH EXTRACTION     Patient Active Problem List   Diagnosis Date Noted   Obesity (BMI 30.0-34.9) 11/11/2022   Osteoarthritis of left glenohumeral joint 06/04/2020   Pulmonary embolism (HCC) 11/27/2019   OSA on CPAP    Status post total replacement of right hip  11/20/2019   Unilateral primary osteoarthritis, right hip 07/18/2019   Right inguinal hernia 08/29/2018   Fatigue 08/13/2018   Hyperestrogenism 10/20/2017   Low testosterone  in male 10/20/2017   Vitamin B12 deficiency (non anemic) 10/20/2017   Encounter for screening colonoscopy 05/20/2017   Family history of malignant neoplasm of prostate 05/30/2015   Malignant neoplasm of lateral wall of urinary bladder (HCC) 05/30/2015   Elevated prostate specific antigen (PSA) 03/24/2015   Depression 08/03/2012   Low libido 05/03/2012   ED (erectile dysfunction) of organic origin 11/03/2011   Recurrent nephrolithiasis 11/03/2011   Renal cyst 11/03/2011   Cardiovascular risk factor 12/13/2010   Hyperlipidemia 10/18/2008    PCP: Roetta Blunt, MD  REFERRING PROVIDER: Rubie Kemps, MD  REFERRING DIAG:  Diagnosis  M17.12 (ICD-10-CM) - Unilateral primary osteoarthritis, left knee     Free Text Diagnosis  Lt TKA 12/18    THERAPY DIAG:  Decreased range of motion of left knee  Total knee replacement status, left  Acute pain of left knee  Rationale for Evaluation and Treatment: Rehabilitation  ONSET DATE: 05/31/24 (Procedure)   SUBJECTIVE:   SUBJECTIVE STATEMENT: Its still sore   Pt confirmed surgery was performed 05/31/24. Pt states pain is at a 7.5/10 and has been consistently since surgery. Pt reports he has been icing his knee and has taken some pain medication that has not been  helping much. 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    PERTINENT HISTORY: See PMH chart above PAIN:  Are you having pain? Yes: NPRS scale: 5/10 Pain location: L knee Pain description: Discomfort Aggravating factors: Prolonged standing/walking Relieving factors: Rest  PRECAUTIONS: None  RED FLAGS: None   WEIGHT BEARING RESTRICTIONS: No  FALLS:  Has patient fallen in last 6 months? No  LIVING ENVIRONMENT: Lives with: lives alone Lives  in: House/apartment Stairs: Yes: Internal: 15 steps; on right going up Has following equipment at home: Vannie - 2 wheeled  OCCUPATION: Psychologist, sport and exercise   PLOF: Independent  PATIENT GOALS: Return to work, driving, and household responsibilities without increase in pain.   NEXT MD VISIT: 06/12/24 removal of stitches   OBJECTIVE:  Note: Objective measures were completed at Evaluation unless otherwise noted.  DIAGNOSTIC FINDINGS: Imaging ordered on 12/29- US  Venous Img Lower Unilateral Left (DVT)   PATIENT SURVEYS:  LEFS: Lower Extremity Functional Score: 10 / 80 = 12.5 %  COGNITION: Overall cognitive status: Within functional limits for tasks assessed     SENSATION: WFL   LOWER EXTREMITY ROM:  Active ROM Right eval Left eval Left 06/25/24  Hip flexion WNL WNL   Hip extension     Hip abduction     Hip adduction     Hip internal rotation     Hip external rotation     Knee flexion WNL 90 101  Knee extension WNL -25 21  Ankle dorsiflexion WNL    Ankle plantarflexion     Ankle inversion     Ankle eversion      (Blank rows = not tested)  LOWER EXTREMITY MMT:  MMT Right eval Left eval  Hip flexion WNL   Hip extension    Hip abduction    Hip adduction    Hip internal rotation    Hip external rotation    Knee flexion WNL 2/5  Knee extension WNL 2/5  Ankle dorsiflexion WNL   Ankle plantarflexion    Ankle inversion    Ankle eversion     (Blank rows = not tested)   FUNCTIONAL TESTS:   TUG: 20s  30s STS: Unable to do without use of UE  LEFS: Lower Extremity Functional Score: 10 / 80 = 12.5 %  GAIT: Distance walked: In Clinic  Assistive device utilized: Environmental Consultant - 2 wheeled Level of assistance: Complete Independence Comments: Decreased stance time on LLE during gait, decreased knee extension upon heel strike, use of UE on RW                                                                                                                                  TREATMENT DATE 06/26/23 NuStep L 5 x 6 min 30lb resisted gait 30lb 4 way x 3 each HS curls 35lb 2x10 Leg Ext 5lb 2x10 L knee PROM w/ end range holds  Patellar mobs  Tibiofemoral  Jt mobs S2S 2x10  LLE LAQ 3lb 2x10 50lb leg press 2x10 LLE 20lb 2x5  06/21/24 NuStep L5 x  STS 2x10 Step Ups 4 1x10 6 1x10 Lateral banded walking Rtband Ambulation- gait training 375' Quad Sets half foam 2x10  06/18/24 NuStep L5 x 6 min Sit to stand 2x10 w/ progressive lowering of mat TUG 8.40 sec L knee PROM w/ end range holds  Patellar mobs  Tibiofemoral  Jt mobs HS curls green LLE 2x10 LAQ LLE 2lb 3x10 Leg press 40lb 2x10  LLE 20lb 2x5 TKE Slant board calf stretch   06/13/24 L knee PROM w/ end range holds  Patellar mobs  Tibiofemoral  Jt mobs  LLE HS curls red 2x10  LLE LAQ 2x10 Sit to stands 2x10 from elevated mat  NuStep L5 x 6 min Standing march Leg press 40lb 2x10  06/11/24- Eval    PATIENT EDUCATION:  Education details: HEP Person educated: Patient Education method: Medical Illustrator Education comprehension: verbalized understanding and returned demonstration  HOME EXERCISE PROGRAM:  Access Code: [email or web address] Date: 06/11/2024 Exercises - Seated Heel Slide  - 1 x daily - 7 x weekly - 2 sets - 10 reps - 3 hold - Supine Quad Set  - 1 x daily - 7 x weekly - 2 sets - 10 reps - 3 hold - Side to Side Weight Shift with Counter Support  - 1 x daily - 7 x weekly - 2 sets - 10 reps   ASSESSMENT:  CLINICAL IMPRESSION:  Pt doing well with current regime. Pt with decreased L knee extension causing flexed posture during gait. . Pt amble to ambulated throughout session without AD. End range pain with PROM, soft end feel with flexion. Cues for eccentric control needed with resisted gait. Heavy focus placed on extension.  Pt will benefit from continued therapy to increase L knee ROM, improve gait quality, and strengthen the LLE.   Patient is a 69 y.o. male who was seen  today for physical therapy treatment for LLE knee s/p TKA. S/p L TKA.  Pt with decreased ROM and decreased strength in LLE at the knee joint.  Pt enters ambulating with SPC in incorrect had, so he was corrected and given skilled instruction on proper SPC use. Session completed without AD.  End range pain with PROM, despite soft end feel for flexion. Progressed to LE strengthening without issue. Cue for eccentric control with leg press.  Pt will benefit from PROM and AAROM in early stages to gradually progress mobility.  Pt has hx of DVT applies TED hose socks.   OBJECTIVE IMPAIRMENTS: Abnormal gait, decreased endurance, decreased ROM, decreased strength, and pain.   ACTIVITY LIMITATIONS: carrying and lifting  PARTICIPATION LIMITATIONS: cleaning and laundry  REHAB POTENTIAL: Good  CLINICAL DECISION MAKING: Stable/uncomplicated  EVALUATION COMPLEXITY: Low   GOALS: Goals reviewed with patient? Yes  SHORT TERM GOALS: Target date: 07/16/24  Patient will be independent with initial HEP. Baseline:  Goal status: Progressing ~50% doing 06/21/24  2. Pt will improve TUG score to <14s to show improved functional mobility.   Baseline: 20s with RW   Goal Status: Met 06/19/23     LONG TERM GOALS: Target date: 09/03/24  Patient will be independent with advanced/ongoing HEP to improve outcomes and carryover.  Baseline:  Goal status: IN PROGRESS 06/21/24  2.  Patient will report at least 75% improvement in L knee pain to improve QOL. Baseline: 7.5/10 Goal status: IN PROGRESS 6/10 at  worse 06/21/24  3.  Patient will demonstrate improved active knee AROM to WNL to allow for normal gait and stair mechanics. Baseline:  Goal status: INITIAL -25-0-95 06/21/24, Progressing 06/26/23  4.  Patient will demonstrate improved functional LE strength as demonstrated by 5/5 strength in LLE. Baseline: See Chart Goal status: IN PROGRESS 06/21/24  5.  Patient will be able to ambulate 600' with LRAD and normal gait pattern  without increased pain to access community.  Baseline:  Goal status: IN PROGRESS 06/21/24  6. Patient will be able to ascend/descend stairs with 1 HR and reciprocal step pattern safely to access home and community.  Baseline:  Goal status: IN PROGRESS 1 step at a time 06/21/24  7.  Patient will report 20 point increase on LEFS to demonstrate improved functional ability. Baseline: 10 Goal status: INITIAL   PLAN:  PT FREQUENCY: 2x/week  PT DURATION: 10 weeks  PLANNED INTERVENTIONS: 97110-Therapeutic exercises, 97530- Therapeutic activity, 97112- Neuromuscular re-education, 97535- Self Care, [id number]- Manual therapy, [phone]- Gait training, and Patient/Family education  PLAN FOR NEXT SESSION: PROM knee flex/ext, AAROM, increase standing tolerance, ice if needed    Tanda KANDICE Sorrow, PTA 06/25/2024, 10:55 AM  "

## 2024-06-28 ENCOUNTER — Encounter: Payer: Self-pay | Admitting: Physical Therapy

## 2024-06-28 ENCOUNTER — Ambulatory Visit: Admitting: Physical Therapy

## 2024-06-28 DIAGNOSIS — Z96652 Presence of left artificial knee joint: Secondary | ICD-10-CM

## 2024-06-28 DIAGNOSIS — M25662 Stiffness of left knee, not elsewhere classified: Secondary | ICD-10-CM

## 2024-06-28 DIAGNOSIS — M25562 Pain in left knee: Secondary | ICD-10-CM

## 2024-06-28 NOTE — Therapy (Signed)
 " OUTPATIENT PHYSICAL THERAPY LOWER EXTREMITY TREATMENT   [name] MRN: [id number] DOB:01/12/1956, 69 y.o., male Today's Date: 06/28/2024  END OF SESSION:  PT End of Session - 06/28/24 1059     Visit Number 6    Date for Recertification  09/03/24    PT Start Time 1058    PT Stop Time 1143    PT Time Calculation (min) 45 min    Activity Tolerance Patient tolerated treatment well    Behavior During Therapy WFL for tasks assessed/performed          Past Medical History:  Diagnosis Date   Arthritis    knees   Cancer (HCC)    bladder cancer   DVT (deep venous thrombosis) (HCC)    GERD (gastroesophageal reflux disease)    Heart murmur    diagnosed with athlete's murmur at 17   History of kidney stones    Hyperlipidemia    Pure hypercholesterolemia    Sleep apnea    CPAP at night   Past Surgical History:  Procedure Laterality Date   INGUINAL HERNIA REPAIR     IRRIGATION AND DEBRIDEMENT SEBACEOUS CYST     KNEE SURGERY Left 2013   PERIPHERAL VASCULAR BALLOON ANGIOPLASTY Right 03/23/2022   Procedure: PERIPHERAL VASCULAR BALLOON ANGIOPLASTY;  Surgeon: Serene Gaile ORN, MD;  Location: [facility];  Service: Cardiovascular;  Laterality: Right;   PERIPHERAL VASCULAR THROMBECTOMY N/A 03/23/2022   Procedure: PERIPHERAL VASCULAR THROMBECTOMY;  Surgeon: Serene Gaile ORN, MD;  Location: [facility];  Service: Cardiovascular;  Laterality: N/A;   SHOULDER SURGERY Right 2013   TOTAL HIP ARTHROPLASTY Right 11/20/2019   Procedure: RIGHT TOTAL HIP ARTHROPLASTY ANTERIOR APPROACH;  Surgeon: Vernetta Lonni GRADE, MD;  Location: [facility];  Service: Orthopedics;  Laterality: Right;   WISDOM TOOTH EXTRACTION     Patient Active Problem List   Diagnosis Date Noted   Obesity (BMI 30.0-34.9) 11/11/2022   Osteoarthritis of left glenohumeral joint 06/04/2020   Pulmonary embolism (HCC) 11/27/2019   OSA on CPAP    Status post total replacement of right hip  11/20/2019   Unilateral primary osteoarthritis, right hip 07/18/2019   Right inguinal hernia 08/29/2018   Fatigue 08/13/2018   Hyperestrogenism 10/20/2017   Low testosterone  in male 10/20/2017   Vitamin B12 deficiency (non anemic) 10/20/2017   Encounter for screening colonoscopy 05/20/2017   Family history of malignant neoplasm of prostate 05/30/2015   Malignant neoplasm of lateral wall of urinary bladder (HCC) 05/30/2015   Elevated prostate specific antigen (PSA) 03/24/2015   Depression 08/03/2012   Low libido 05/03/2012   ED (erectile dysfunction) of organic origin 11/03/2011   Recurrent nephrolithiasis 11/03/2011   Renal cyst 11/03/2011   Cardiovascular risk factor 12/13/2010   Hyperlipidemia 10/18/2008    PCP: Roetta Blunt, MD  REFERRING PROVIDER: Rubie Kemps, MD  REFERRING DIAG:  Diagnosis  M17.12 (ICD-10-CM) - Unilateral primary osteoarthritis, left knee     Free Text Diagnosis  Lt TKA 12/18    THERAPY DIAG:  Decreased range of motion of left knee  Total knee replacement status, left  Acute pain of left knee  Rationale for Evaluation and Treatment: Rehabilitation  ONSET DATE: 05/31/24 (Procedure)   SUBJECTIVE:   SUBJECTIVE STATEMENT: ok,ok   Pt confirmed surgery was performed 05/31/24. Pt states pain is at a 7.5/10 and has been consistently since surgery. Pt reports he has been icing his knee and has taken some pain medication that has not been helping much.  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    PERTINENT HISTORY: See PMH chart above PAIN:  Are you having pain? Yes: NPRS scale: 5/10 Pain location: L knee Pain description: Discomfort Aggravating factors: Prolonged standing/walking Relieving factors: Rest  PRECAUTIONS: None  RED FLAGS: None   WEIGHT BEARING RESTRICTIONS: No  FALLS:  Has patient fallen in last 6 months? No  LIVING ENVIRONMENT: Lives with: lives alone Lives in:  House/apartment Stairs: Yes: Internal: 15 steps; on right going up Has following equipment at home: Vannie - 2 wheeled  OCCUPATION: Psychologist, sport and exercise   PLOF: Independent  PATIENT GOALS: Return to work, driving, and household responsibilities without increase in pain.   NEXT MD VISIT: 06/12/24 removal of stitches   OBJECTIVE:  Note: Objective measures were completed at Evaluation unless otherwise noted.  DIAGNOSTIC FINDINGS: Imaging ordered on 12/29- US  Venous Img Lower Unilateral Left (DVT)   PATIENT SURVEYS:  LEFS: Lower Extremity Functional Score: 10 / 80 = 12.5 %  COGNITION: Overall cognitive status: Within functional limits for tasks assessed     SENSATION: WFL   LOWER EXTREMITY ROM:  Active ROM Right eval Left eval Left 06/25/24  Hip flexion WNL WNL   Hip extension     Hip abduction     Hip adduction     Hip internal rotation     Hip external rotation     Knee flexion WNL 90 101  Knee extension WNL -25 21  Ankle dorsiflexion WNL    Ankle plantarflexion     Ankle inversion     Ankle eversion      (Blank rows = not tested)  LOWER EXTREMITY MMT:  MMT Right eval Left eval  Hip flexion WNL   Hip extension    Hip abduction    Hip adduction    Hip internal rotation    Hip external rotation    Knee flexion WNL 2/5  Knee extension WNL 2/5  Ankle dorsiflexion WNL   Ankle plantarflexion    Ankle inversion    Ankle eversion     (Blank rows = not tested)   FUNCTIONAL TESTS:   TUG: 20s  30s STS: Unable to do without use of UE  LEFS: Lower Extremity Functional Score: 10 / 80 = 12.5 %  GAIT: Distance walked: In Clinic  Assistive device utilized: Environmental Consultant - 2 wheeled Level of assistance: Complete Independence Comments: Decreased stance time on LLE during gait, decreased knee extension upon heel strike, use of UE on RW                                                                                                                                 TREATMENT  DATE 06/28/24 NuStep L4 x 6 min LE only 50lb leg press 2x15  50lblb Heel raises  LLE 20lb 2x10 Forward and lateral step ups 6in x 10 each HS curls 35lb x15  LLE 15lb 2x10 Leg Ext 10lb x15  LLE 5lb 2x10 L knee PROM w/ end range holds  Patellar mobs  Tibiofemoral  Jt mobs  06/26/23 NuStep L 5 x 6 min 30lb resisted gait 30lb 4 way x 3 each HS curls 35lb 2x10 Leg Ext 5lb 2x10 L knee PROM w/ end range holds  Patellar mobs  Tibiofemoral  Jt mobs S2S 2x10  LLE LAQ 3lb 2x10 50lb leg press 2x10 LLE 20lb 2x5  06/21/24 NuStep L5 x  STS 2x10 Step Ups 4 1x10 6 1x10 Lateral banded walking Rtband Ambulation- gait training 375' Quad Sets half foam 2x10  06/18/24 NuStep L5 x 6 min Sit to stand 2x10 w/ progressive lowering of mat TUG 8.40 sec L knee PROM w/ end range holds  Patellar mobs  Tibiofemoral  Jt mobs HS curls green LLE 2x10 LAQ LLE 2lb 3x10 Leg press 40lb 2x10  LLE 20lb 2x5 TKE Slant board calf stretch   06/13/24 L knee PROM w/ end range holds  Patellar mobs  Tibiofemoral  Jt mobs  LLE HS curls red 2x10  LLE LAQ 2x10 Sit to stands 2x10 from elevated mat  NuStep L5 x 6 min Standing march Leg press 40lb 2x10  06/11/24- Eval    PATIENT EDUCATION:  Education details: HEP Person educated: Patient Education method: Medical Illustrator Education comprehension: verbalized understanding and returned demonstration  HOME EXERCISE PROGRAM:  Access Code: [email or web address] Date: 06/11/2024 Exercises - Seated Heel Slide  - 1 x daily - 7 x weekly - 2 sets - 10 reps - 3 hold - Supine Quad Set  - 1 x daily - 7 x weekly - 2 sets - 10 reps - 3 hold - Side to Side Weight Shift with Counter Support  - 1 x daily - 7 x weekly - 2 sets - 10 reps   ASSESSMENT:  CLINICAL IMPRESSION:  Pt doing well with current regime. Pt with decreased L knee extension causing flexed posture during gait. Main focus place on LLE functional strength and ROM. Again pt able to ambulate  throughout session without AD. End range pain with PROM, soft end feel with flexion. Cues for eccentric control needed with leg press. Heavy focus placed on extension.  Pt will benefit from continued therapy to increase L knee ROM, improve gait quality, and strengthen the LLE.   Patient is a 69 y.o. male who was seen today for physical therapy treatment for LLE knee s/p TKA. S/p L TKA.  Pt with decreased ROM and decreased strength in LLE at the knee joint.  Pt enters ambulating with SPC in incorrect had, so he was corrected and given skilled instruction on proper SPC use. Session completed without AD.  End range pain with PROM, despite soft end feel for flexion. Progressed to LE strengthening without issue. Cue for eccentric control with leg press.  Pt will benefit from PROM and AAROM in early stages to gradually progress mobility.  Pt has hx of DVT applies TED hose socks.   OBJECTIVE IMPAIRMENTS: Abnormal gait, decreased endurance, decreased ROM, decreased strength, and pain.   ACTIVITY LIMITATIONS: carrying and lifting  PARTICIPATION LIMITATIONS: cleaning and laundry  REHAB POTENTIAL: Good  CLINICAL DECISION MAKING: Stable/uncomplicated  EVALUATION COMPLEXITY: Low   GOALS: Goals reviewed with patient? Yes  SHORT TERM GOALS: Target date: 07/16/24  Patient will be independent with initial HEP. Baseline:  Goal status: Progressing ~50% doing 06/21/24  2. Pt will improve TUG score to <14s to show improved functional mobility.  Baseline: 20s with RW   Goal Status: Met 06/19/23     LONG TERM GOALS: Target date: 09/03/24  Patient will be independent with advanced/ongoing HEP to improve outcomes and carryover.  Baseline:  Goal status: IN PROGRESS 06/21/24  2.  Patient will report at least 75% improvement in L knee pain to improve QOL. Baseline: 7.5/10 Goal status: IN PROGRESS 6/10 at worse 06/21/24  3.  Patient will demonstrate improved active knee AROM to WNL to allow for normal gait and  stair mechanics. Baseline:  Goal status: INITIAL -25-0-95 06/21/24, Progressing 06/26/23  4.  Patient will demonstrate improved functional LE strength as demonstrated by 5/5 strength in LLE. Baseline: See Chart Goal status: IN PROGRESS 06/21/24  5.  Patient will be able to ambulate 600' with LRAD and normal gait pattern without increased pain to access community.  Baseline:  Goal status: IN PROGRESS 06/21/24  6. Patient will be able to ascend/descend stairs with 1 HR and reciprocal step pattern safely to access home and community.  Baseline:  Goal status: IN PROGRESS 1 step at a time 06/21/24  7.  Patient will report 20 point increase on LEFS to demonstrate improved functional ability. Baseline: 10 Goal status: INITIAL   PLAN:  PT FREQUENCY: 2x/week  PT DURATION: 10 weeks  PLANNED INTERVENTIONS: 97110-Therapeutic exercises, 97530- Therapeutic activity, [id number]- Neuromuscular re-education, 97535- Self Care, [id number]- Manual therapy, [phone]- Gait training, and Patient/Family education  PLAN FOR NEXT SESSION: PROM knee flex/ext, AAROM, increase standing tolerance, ice if needed    Tanda KANDICE Sorrow, PTA 06/28/2024, 11:00 AM  "

## 2024-07-03 ENCOUNTER — Ambulatory Visit: Admitting: Physical Therapy

## 2024-07-03 ENCOUNTER — Encounter: Payer: Self-pay | Admitting: Physical Therapy

## 2024-07-03 DIAGNOSIS — M25662 Stiffness of left knee, not elsewhere classified: Secondary | ICD-10-CM | POA: Diagnosis not present

## 2024-07-03 DIAGNOSIS — M25562 Pain in left knee: Secondary | ICD-10-CM

## 2024-07-03 DIAGNOSIS — Z4789 Encounter for other orthopedic aftercare: Secondary | ICD-10-CM

## 2024-07-03 DIAGNOSIS — Z96652 Presence of left artificial knee joint: Secondary | ICD-10-CM

## 2024-07-03 NOTE — Therapy (Signed)
 " OUTPATIENT PHYSICAL THERAPY LOWER EXTREMITY TREATMENT   [name] MRN: [id number] DOB:11/23/1955, 69 y.o., male Today's Date: 07/03/2024  END OF SESSION:  PT End of Session - 07/03/24 1556     Visit Number 7    Date for Recertification  09/03/24    PT Start Time 1600    PT Stop Time 1645    PT Time Calculation (min) 45 min    Activity Tolerance Patient tolerated treatment well    Behavior During Therapy WFL for tasks assessed/performed          Past Medical History:  Diagnosis Date   Arthritis    knees   Cancer (HCC)    bladder cancer   DVT (deep venous thrombosis) (HCC)    GERD (gastroesophageal reflux disease)    Heart murmur    diagnosed with athlete's murmur at 17   History of kidney stones    Hyperlipidemia    Pure hypercholesterolemia    Sleep apnea    CPAP at night   Past Surgical History:  Procedure Laterality Date   INGUINAL HERNIA REPAIR     IRRIGATION AND DEBRIDEMENT SEBACEOUS CYST     KNEE SURGERY Left 2013   PERIPHERAL VASCULAR BALLOON ANGIOPLASTY Right 03/23/2022   Procedure: PERIPHERAL VASCULAR BALLOON ANGIOPLASTY;  Surgeon: Serene Gaile ORN, MD;  Location: [facility];  Service: Cardiovascular;  Laterality: Right;   PERIPHERAL VASCULAR THROMBECTOMY N/A 03/23/2022   Procedure: PERIPHERAL VASCULAR THROMBECTOMY;  Surgeon: Serene Gaile ORN, MD;  Location: [facility];  Service: Cardiovascular;  Laterality: N/A;   SHOULDER SURGERY Right 2013   TOTAL HIP ARTHROPLASTY Right 11/20/2019   Procedure: RIGHT TOTAL HIP ARTHROPLASTY ANTERIOR APPROACH;  Surgeon: Vernetta Lonni GRADE, MD;  Location: [facility];  Service: Orthopedics;  Laterality: Right;   WISDOM TOOTH EXTRACTION     Patient Active Problem List   Diagnosis Date Noted   Obesity (BMI 30.0-34.9) 11/11/2022   Osteoarthritis of left glenohumeral joint 06/04/2020   Pulmonary embolism (HCC) 11/27/2019   OSA on CPAP    Status post total replacement of right hip  11/20/2019   Unilateral primary osteoarthritis, right hip 07/18/2019   Right inguinal hernia 08/29/2018   Fatigue 08/13/2018   Hyperestrogenism 10/20/2017   Low testosterone  in male 10/20/2017   Vitamin B12 deficiency (non anemic) 10/20/2017   Encounter for screening colonoscopy 05/20/2017   Family history of malignant neoplasm of prostate 05/30/2015   Malignant neoplasm of lateral wall of urinary bladder (HCC) 05/30/2015   Elevated prostate specific antigen (PSA) 03/24/2015   Depression 08/03/2012   Low libido 05/03/2012   ED (erectile dysfunction) of organic origin 11/03/2011   Recurrent nephrolithiasis 11/03/2011   Renal cyst 11/03/2011   Cardiovascular risk factor 12/13/2010   Hyperlipidemia 10/18/2008    PCP: Roetta Blunt, MD  REFERRING PROVIDER: Rubie Kemps, MD  REFERRING DIAG:  Diagnosis  M17.12 (ICD-10-CM) - Unilateral primary osteoarthritis, left knee     Free Text Diagnosis  Lt TKA 12/18    THERAPY DIAG:  Decreased range of motion of left knee  Total knee replacement status, left  Acute pain of left knee  Surgical aftercare, musculoskeletal system  Rationale for Evaluation and Treatment: Rehabilitation  ONSET DATE: 05/31/24 (Procedure)   SUBJECTIVE:   SUBJECTIVE STATEMENT: You know, ok, ok   Pt confirmed surgery was performed 05/31/24. Pt states pain is at a 7.5/10 and has been consistently since surgery. Pt reports he has been icing his knee and has taken some  pain medication that has not been helping much. 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    PERTINENT HISTORY: See PMH chart above PAIN:  Are you having pain? Yes: NPRS scale: 5/10 Pain location: L knee Pain description: Discomfort Aggravating factors: Prolonged standing/walking Relieving factors: Rest  PRECAUTIONS: None  RED FLAGS: None   WEIGHT BEARING RESTRICTIONS: No  FALLS:  Has patient fallen in last 6 months? No  LIVING  ENVIRONMENT: Lives with: lives alone Lives in: House/apartment Stairs: Yes: Internal: 15 steps; on right going up Has following equipment at home: Vannie - 2 wheeled  OCCUPATION: Psychologist, sport and exercise   PLOF: Independent  PATIENT GOALS: Return to work, driving, and household responsibilities without increase in pain.   NEXT MD VISIT: 06/12/24 removal of stitches   OBJECTIVE:  Note: Objective measures were completed at Evaluation unless otherwise noted.  DIAGNOSTIC FINDINGS: Imaging ordered on 12/29- US  Venous Img Lower Unilateral Left (DVT)   PATIENT SURVEYS:  LEFS: Lower Extremity Functional Score: 10 / 80 = 12.5 %  COGNITION: Overall cognitive status: Within functional limits for tasks assessed     SENSATION: [facility]   LOWER EXTREMITY ROM:  Active ROM Right eval Left eval Left 06/25/24 Left 07/04/23  Hip flexion WNL WNL    Hip extension      Hip abduction      Hip adduction      Hip internal rotation      Hip external rotation      Knee flexion WNL 90 101 92/ P106  Knee extension WNL -25 21 19/ P15  Ankle dorsiflexion WNL     Ankle plantarflexion      Ankle inversion      Ankle eversion       (Blank rows = not tested)  LOWER EXTREMITY MMT:  MMT Right eval Left eval  Hip flexion WNL   Hip extension    Hip abduction    Hip adduction    Hip internal rotation    Hip external rotation    Knee flexion WNL 2/5  Knee extension WNL 2/5  Ankle dorsiflexion WNL   Ankle plantarflexion    Ankle inversion    Ankle eversion     (Blank rows = not tested)   FUNCTIONAL TESTS:   TUG: 20s  30s STS: Unable to do without use of UE  LEFS: Lower Extremity Functional Score: 10 / 80 = 12.5 %  GAIT: Distance walked: In Clinic  Assistive device utilized: Vannie - 2 wheeled Level of assistance: Complete Independence Comments: Decreased stance time on LLE during gait, decreased knee extension upon heel strike, use of UE on RW                                                                                                                                  TREATMENT DATE 07/03/24 NuStep L 5 x 6 min HS curls  25lb 2x10, LLE 15lb 2x10 Leg Ext 10lb 2x10, RLE 5lb 2x10 L knee PROM w/ end range holds  Patellar mobs  Tibiofemoral  Jt mobs 6in forward & lateral step ups x10 each Leg press 60lb 2x15 Passive LLE HS stretch and knee Ext stretch  06/28/24 NuStep L4 x 6 min LE only 50lb leg press 2x15  50lblb Heel raises  LLE 20lb 2x10 Forward and lateral step ups 6in x 10 each HS curls 35lb x15  LLE 15lb 2x10 Leg Ext 10lb x15  LLE 5lb 2x10 L knee PROM w/ end range holds  Patellar mobs  Tibiofemoral  Jt mobs  06/26/23 NuStep L 5 x 6 min 30lb resisted gait 30lb 4 way x 3 each HS curls 35lb 2x10 Leg Ext 5lb 2x10 L knee PROM w/ end range holds  Patellar mobs  Tibiofemoral  Jt mobs S2S 2x10  LLE LAQ 3lb 2x10 50lb leg press 2x10 LLE 20lb 2x5  06/21/24 NuStep L5 x  STS 2x10 Step Ups 4 1x10 6 1x10 Lateral banded walking Rtband Ambulation- gait training 375' Quad Sets half foam 2x10  06/18/24 NuStep L5 x 6 min Sit to stand 2x10 w/ progressive lowering of mat TUG 8.40 sec L knee PROM w/ end range holds  Patellar mobs  Tibiofemoral  Jt mobs HS curls green LLE 2x10 LAQ LLE 2lb 3x10 Leg press 40lb 2x10  LLE 20lb 2x5 TKE Slant board calf stretch   06/13/24 L knee PROM w/ end range holds  Patellar mobs  Tibiofemoral  Jt mobs  LLE HS curls red 2x10  LLE LAQ 2x10 Sit to stands 2x10 from elevated mat  NuStep L5 x 6 min Standing march Leg press 40lb 2x10  06/11/24- Eval    PATIENT EDUCATION:  Education details: HEP Person educated: Patient Education method: Medical Illustrator Education comprehension: verbalized understanding and returned demonstration  HOME EXERCISE PROGRAM:  Access Code: [email or web address] Date: 06/11/2024 Exercises - Seated Heel Slide  - 1 x daily - 7 x weekly - 2 sets - 10 reps - 3 hold - Supine Quad Set  - 1 x daily - 7  x weekly - 2 sets - 10 reps - 3 hold - Side to Side Weight Shift with Counter Support  - 1 x daily - 7 x weekly - 2 sets - 10 reps   ASSESSMENT:  CLINICAL IMPRESSION:  Pt doing well with current regime. Pt with decreased L knee extension causing flexed posture during gait. Main focus place on LLE functional strength and ROM. End range pain with PROM, soft end feel with flexion. Cues for eccentric control needed with leg press. Heavy focus placed on extension.  Pt will benefit from continued therapy to increase L knee ROM, improve gait quality, and strengthen the LLE.   Patient is a 69 y.o. male who was seen today for physical therapy treatment for LLE knee s/p TKA. S/p L TKA.  Pt with decreased ROM and decreased strength in LLE at the knee joint.  Pt enters ambulating with SPC in incorrect had, so he was corrected and given skilled instruction on proper SPC use. Session completed without AD.  End range pain with PROM, despite soft end feel for flexion. Progressed to LE strengthening without issue. Cue for eccentric control with leg press.  Pt will benefit from PROM and AAROM in early stages to gradually progress mobility.  Pt has hx of DVT applies TED hose socks.   OBJECTIVE IMPAIRMENTS: Abnormal gait, decreased endurance, decreased ROM, decreased strength,  and pain.   ACTIVITY LIMITATIONS: carrying and lifting  PARTICIPATION LIMITATIONS: cleaning and laundry  REHAB POTENTIAL: Good  CLINICAL DECISION MAKING: Stable/uncomplicated  EVALUATION COMPLEXITY: Low   GOALS: Goals reviewed with patient? Yes  SHORT TERM GOALS: Target date: 07/16/24  Patient will be independent with initial HEP. Baseline:  Goal status: Progressing ~50% doing 06/21/24  2. Pt will improve TUG score to <14s to show improved functional mobility.   Baseline: 20s with RW   Goal Status: Met 06/19/23     LONG TERM GOALS: Target date: 09/03/24  Patient will be independent with advanced/ongoing HEP to improve outcomes and  carryover.  Baseline:  Goal status: IN PROGRESS 06/21/24  2.  Patient will report at least 75% improvement in L knee pain to improve QOL. Baseline: 7.5/10 Goal status: IN PROGRESS 6/10 at worse 06/21/24  3.  Patient will demonstrate improved active knee AROM to WNL to allow for normal gait and stair mechanics. Baseline:  Goal status: INITIAL -25-0-95 06/21/24, Progressing 06/26/23  4.  Patient will demonstrate improved functional LE strength as demonstrated by 5/5 strength in LLE. Baseline: See Chart Goal status: IN PROGRESS 06/21/24  5.  Patient will be able to ambulate 600' with LRAD and normal gait pattern without increased pain to access community.  Baseline:  Goal status: IN PROGRESS 06/21/24  6. Patient will be able to ascend/descend stairs with 1 HR and reciprocal step pattern safely to access home and community.  Baseline:  Goal status: IN PROGRESS 1 step at a time 06/21/24  7.  Patient will report 20 point increase on LEFS to demonstrate improved functional ability. Baseline: 10 Goal status: INITIAL   PLAN:  PT FREQUENCY: 2x/week  PT DURATION: 10 weeks  PLANNED INTERVENTIONS: 97110-Therapeutic exercises, 97530- Therapeutic activity, 97112- Neuromuscular re-education, 97535- Self Care, 02859- Manual therapy, [phone]- Gait training, and Patient/Family education  PLAN FOR NEXT SESSION: PROM knee flex/ext, AAROM, increase standing tolerance, ice if needed    Tanda KANDICE Sorrow, PTA 07/03/2024, 3:57 PM  "

## 2024-07-05 ENCOUNTER — Ambulatory Visit

## 2024-07-05 DIAGNOSIS — M25662 Stiffness of left knee, not elsewhere classified: Secondary | ICD-10-CM

## 2024-07-05 DIAGNOSIS — M25562 Pain in left knee: Secondary | ICD-10-CM

## 2024-07-05 DIAGNOSIS — Z96652 Presence of left artificial knee joint: Secondary | ICD-10-CM

## 2024-07-05 DIAGNOSIS — Z4789 Encounter for other orthopedic aftercare: Secondary | ICD-10-CM

## 2024-07-05 NOTE — Therapy (Addendum)
 " OUTPATIENT PHYSICAL THERAPY LOWER EXTREMITY TREATMENT   [name] MRN: [id number] DOB:Oct 13, 1955, 69 y.o., male Today's Date: 07/05/2024  END OF SESSION:  PT End of Session - 07/05/24 1733     Visit Number 8    Date for Recertification  09/03/24    PT Start Time 1700    PT Stop Time 1745    PT Time Calculation (min) 45 min    Activity Tolerance Patient tolerated treatment well    Behavior During Therapy WFL for tasks assessed/performed         Past Medical History:  Diagnosis Date   Arthritis    knees   Cancer (HCC)    bladder cancer   DVT (deep venous thrombosis) (HCC)    GERD (gastroesophageal reflux disease)    Heart murmur    diagnosed with athlete's murmur at 17   History of kidney stones    Hyperlipidemia    Pure hypercholesterolemia    Sleep apnea    CPAP at night   Past Surgical History:  Procedure Laterality Date   INGUINAL HERNIA REPAIR     IRRIGATION AND DEBRIDEMENT SEBACEOUS CYST     KNEE SURGERY Left 2013   PERIPHERAL VASCULAR BALLOON ANGIOPLASTY Right 03/23/2022   Procedure: PERIPHERAL VASCULAR BALLOON ANGIOPLASTY;  Surgeon: Serene Gaile ORN, MD;  Location: [facility];  Service: Cardiovascular;  Laterality: Right;   PERIPHERAL VASCULAR THROMBECTOMY N/A 03/23/2022   Procedure: PERIPHERAL VASCULAR THROMBECTOMY;  Surgeon: Serene Gaile ORN, MD;  Location: [facility];  Service: Cardiovascular;  Laterality: N/A;   SHOULDER SURGERY Right 2013   TOTAL HIP ARTHROPLASTY Right 11/20/2019   Procedure: RIGHT TOTAL HIP ARTHROPLASTY ANTERIOR APPROACH;  Surgeon: Vernetta Lonni GRADE, MD;  Location: [facility];  Service: Orthopedics;  Laterality: Right;   WISDOM TOOTH EXTRACTION     Patient Active Problem List   Diagnosis Date Noted   Obesity (BMI 30.0-34.9) 11/11/2022   Osteoarthritis of left glenohumeral joint 06/04/2020   Pulmonary embolism (HCC) 11/27/2019   OSA on CPAP    Status post total replacement of right hip  11/20/2019   Unilateral primary osteoarthritis, right hip 07/18/2019   Right inguinal hernia 08/29/2018   Fatigue 08/13/2018   Hyperestrogenism 10/20/2017   Low testosterone  in male 10/20/2017   Vitamin B12 deficiency (non anemic) 10/20/2017   Encounter for screening colonoscopy 05/20/2017   Family history of malignant neoplasm of prostate 05/30/2015   Malignant neoplasm of lateral wall of urinary bladder (HCC) 05/30/2015   Elevated prostate specific antigen (PSA) 03/24/2015   Depression 08/03/2012   Low libido 05/03/2012   ED (erectile dysfunction) of organic origin 11/03/2011   Recurrent nephrolithiasis 11/03/2011   Renal cyst 11/03/2011   Cardiovascular risk factor 12/13/2010   Hyperlipidemia 10/18/2008    PCP: Roetta Blunt, MD  REFERRING PROVIDER: Rubie Kemps, MD  REFERRING DIAG:  Diagnosis  M17.12 (ICD-10-CM) - Unilateral primary osteoarthritis, left knee     Free Text Diagnosis  Lt TKA 12/18    THERAPY DIAG:  Decreased range of motion of left knee  Surgical aftercare, musculoskeletal system  Total knee replacement status, left  Acute pain of left knee  Rationale for Evaluation and Treatment: Rehabilitation  ONSET DATE: 05/31/24 (Procedure)   SUBJECTIVE:   SUBJECTIVE STATEMENT: Pt reported some soreness following previous session; reports doing okay today. Pt states he still has some swelling around the L knee joint and plans to increase maintenance of HEP.    Pt confirmed surgery was performed  05/31/24. Pt states pain is at a 7.5/10 and has been consistently since surgery. Pt reports he has been icing his knee and has taken some pain medication that has not been helping much. 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    PERTINENT HISTORY: See PMH chart above PAIN:  Are you having pain? Yes: NPRS scale: 5/10 Pain location: L knee Pain description: Discomfort Aggravating factors: Prolonged  standing/walking Relieving factors: Rest  PRECAUTIONS: None  RED FLAGS: None   WEIGHT BEARING RESTRICTIONS: No  FALLS:  Has patient fallen in last 6 months? No  LIVING ENVIRONMENT: Lives with: lives alone Lives in: House/apartment Stairs: Yes: Internal: 15 steps; on right going up Has following equipment at home: Vannie - 2 wheeled  OCCUPATION: Psychologist, sport and exercise   PLOF: Independent  PATIENT GOALS: Return to work, driving, and household responsibilities without increase in pain.   NEXT MD VISIT: 06/12/24 removal of stitches   OBJECTIVE:  Note: Objective measures were completed at Evaluation unless otherwise noted.  DIAGNOSTIC FINDINGS: Imaging ordered on 12/29- US  Venous Img Lower Unilateral Left (DVT)   PATIENT SURVEYS:  LEFS: Lower Extremity Functional Score: 10 / 80 = 12.5 %  COGNITION: Overall cognitive status: Within functional limits for tasks assessed     SENSATION: [facility]   LOWER EXTREMITY ROM:  Active ROM Right eval Left eval Left 06/25/24 Left 07/04/23 07/05/24  Hip flexion WNL WNL     Hip extension       Hip abduction       Hip adduction       Hip internal rotation       Hip external rotation       Knee flexion WNL 90 101 92/ P106 94  Knee extension WNL -25 21 19/ P15 16  Ankle dorsiflexion WNL      Ankle plantarflexion       Ankle inversion       Ankle eversion        (Blank rows = not tested)  LOWER EXTREMITY MMT:  MMT Right eval Left eval  Hip flexion WNL   Hip extension    Hip abduction    Hip adduction    Hip internal rotation    Hip external rotation    Knee flexion WNL 2/5  Knee extension WNL 2/5  Ankle dorsiflexion WNL   Ankle plantarflexion    Ankle inversion    Ankle eversion     (Blank rows = not tested)   FUNCTIONAL TESTS:   TUG: 20s  30s STS: Unable to do without use of UE  LEFS: Lower Extremity Functional Score: 10 / 80 = 12.5 %  GAIT: Distance walked: In Clinic  Assistive device utilized: Walker - 2  wheeled Level of assistance: Complete Independence Comments: Decreased stance time on LLE during gait, decreased knee extension upon heel strike, use of UE on RW  TREATMENT DATE  07/05/24 NuStep L5 x 8 min Distration Mobilization  Tibiofemoral joint mobs  Leg ext Manual Mobilization  PROM w/ end range holds Quad Sets Foam roller under L LE  Supine for game ready, vasopneumatic with L LE elevated, 10 min to assist with edema and exercise soreness    07/03/24 NuStep L 5 x 6 min HS curls 25lb 2x10, LLE 15lb 2x10 Leg Ext 10lb 2x10, RLE 5lb 2x10 L knee PROM w/ end range holds  Patellar mobs  Tibiofemoral  Jt mobs 6in forward & lateral step ups x10 each Leg press 60lb 2x15 Passive LLE HS stretch and knee Ext stretch  06/28/24 NuStep L4 x 6 min LE only 50lb leg press 2x15  50lblb Heel raises  LLE 20lb 2x10 Forward and lateral step ups 6in x 10 each HS curls 35lb x15  LLE 15lb 2x10 Leg Ext 10lb x15  LLE 5lb 2x10 L knee PROM w/ end range holds  Patellar mobs  Tibiofemoral  Jt mobs  06/26/23 NuStep L 5 x 6 min 30lb resisted gait 30lb 4 way x 3 each HS curls 35lb 2x10 Leg Ext 5lb 2x10 L knee PROM w/ end range holds  Patellar mobs  Tibiofemoral  Jt mobs S2S 2x10  LLE LAQ 3lb 2x10 50lb leg press 2x10 LLE 20lb 2x5  06/21/24 NuStep L5 x  STS 2x10 Step Ups 4 1x10 6 1x10 Lateral banded walking Rtband Ambulation- gait training 375' Quad Sets half foam 2x10  06/18/24 NuStep L5 x 6 min Sit to stand 2x10 w/ progressive lowering of mat TUG 8.40 sec L knee PROM w/ end range holds  Patellar mobs  Tibiofemoral  Jt mobs HS curls green LLE 2x10 LAQ LLE 2lb 3x10 Leg press 40lb 2x10  LLE 20lb 2x5 TKE Slant board calf stretch   06/13/24 L knee PROM w/ end range holds  Patellar mobs  Tibiofemoral  Jt mobs  LLE HS curls red 2x10  LLE LAQ  2x10 Sit to stands 2x10 from elevated mat  NuStep L5 x 6 min Standing march Leg press 40lb 2x10  06/11/24- Eval    PATIENT EDUCATION:  Education details: HEP Person educated: Patient Education method: Medical Illustrator Education comprehension: verbalized understanding and returned demonstration  HOME EXERCISE PROGRAM:  Access Code: [email or web address] Date: 06/11/2024 Exercises - Seated Heel Slide  - 1 x daily - 7 x weekly - 2 sets - 10 reps - 3 hold - Supine Quad Set  - 1 x daily - 7 x weekly - 2 sets - 10 reps - 3 hold - Side to Side Weight Shift with Counter Support  - 1 x daily - 7 x weekly - 2 sets - 10 reps   ASSESSMENT:  CLINICAL IMPRESSION:  Focus today was on improving knee extension while utilizing mobilizations to open up joint space to gain more range and decrease pain. Pt doing well with current regime. Pt with decreased L knee extension causing flexed posture during gait decreased knee flexion during swing phase. Pt will benefit from continued therapy to increase L knee ROM, improve gait quality, and strengthen the LLE.   Patient is a 69 y.o. male who was seen today for physical therapy treatment for LLE knee s/p TKA. S/p L TKA.  Pt with decreased ROM and decreased strength in LLE at the knee joint.  Pt enters ambulating with SPC in incorrect had, so he was corrected and given skilled instruction on proper SPC use. Session completed without AD.  End range  pain with PROM, despite soft end feel for flexion. Progressed to LE strengthening without issue. Cue for eccentric control with leg press.  Pt will benefit from PROM and AAROM in early stages to gradually progress mobility.  Pt has hx of DVT applies TED hose socks.   OBJECTIVE IMPAIRMENTS: Abnormal gait, decreased endurance, decreased ROM, decreased strength, and pain.   ACTIVITY LIMITATIONS: carrying and lifting  PARTICIPATION LIMITATIONS: cleaning and laundry  REHAB POTENTIAL: Good  CLINICAL DECISION MAKING:  Stable/uncomplicated  EVALUATION COMPLEXITY: Low   GOALS: Goals reviewed with patient? Yes  SHORT TERM GOALS: Target date: 07/16/24  Patient will be independent with initial HEP. Baseline:  Goal status: Progressing ~50% doing 07/05/24  2. Pt will improve TUG score to <14s to show improved functional mobility.   Baseline: 20s with RW   Goal Status: Met 06/19/23     LONG TERM GOALS: Target date: 09/03/24  Patient will be independent with advanced/ongoing HEP to improve outcomes and carryover.  Baseline:  Goal status: IN PROGRESS 07/05/24  2.  Patient will report at least 75% improvement in L knee pain to improve QOL. Baseline: 7.5/10 Goal status: IN PROGRESS 6/10 at worse 06/21/24; unchanged 07/05/24  3.  Patient will demonstrate improved active knee AROM to WNL to allow for normal gait and stair mechanics. Baseline:  Goal status: INITIAL -25-0-95 06/21/24, Progressing 06/26/23; -16-0-94 07/05/24  4.  Patient will demonstrate improved functional LE strength as demonstrated by 5/5 strength in LLE. Baseline: See Chart Goal status: IN PROGRESS 07/05/24  5.  Patient will be able to ambulate 600' with LRAD and normal gait pattern without increased pain to access community.  Baseline:  Goal status: IN PROGRESS 07/05/24  6. Patient will be able to ascend/descend stairs with 1 HR and reciprocal step pattern safely to access home and community.  Baseline:  Goal status: IN PROGRESS 1 step at a time 1 HR 07/05/24  7.  Patient will report 20 point increase on LEFS to demonstrate improved functional ability. Baseline: 10 Goal status: IN PROGRESS Lower Extremity Functional Score: 16 / 80 = 20.0 % 07/05/24   PLAN:  PT FREQUENCY: 2x/week  PT DURATION: 10 weeks  PLANNED INTERVENTIONS: 97110-Therapeutic exercises, 97530- Therapeutic activity, 97112- Neuromuscular re-education, 97535- Self Care, [id number]- Manual therapy, [phone]- Gait training, [phone]- Vasopneumatic device, Patient/Family education, and  Cryotherapy  PLAN FOR NEXT SESSION: PROM knee flex/ext, AAROM, increase standing tolerance, ice if needed    Thersia Alder, Student-PT 07/05/2024, 5:34 PM  "

## 2024-07-09 ENCOUNTER — Ambulatory Visit: Admitting: Physical Therapy

## 2024-07-12 ENCOUNTER — Ambulatory Visit: Admitting: Physical Therapy

## 2024-07-12 DIAGNOSIS — M25562 Pain in left knee: Secondary | ICD-10-CM

## 2024-07-12 DIAGNOSIS — M25662 Stiffness of left knee, not elsewhere classified: Secondary | ICD-10-CM | POA: Diagnosis not present

## 2024-07-12 DIAGNOSIS — Z4789 Encounter for other orthopedic aftercare: Secondary | ICD-10-CM

## 2024-07-12 DIAGNOSIS — Z96652 Presence of left artificial knee joint: Secondary | ICD-10-CM

## 2024-07-12 NOTE — Therapy (Signed)
 " OUTPATIENT PHYSICAL THERAPY LOWER EXTREMITY TREATMENT   [name] MRN: [id number] DOB:1955-07-31, 69 y.o., male Today's Date: 07/12/2024  END OF SESSION:  PT End of Session - 07/12/24 1650     Visit Number 9    Date for Recertification  09/03/24    Authorization Type [employer] Medicare    PT Start Time 1650    PT Stop Time 1740    PT Time Calculation (min) 50 min         Past Medical History:  Diagnosis Date   Arthritis    knees   Cancer (HCC)    bladder cancer   DVT (deep venous thrombosis) (HCC)    GERD (gastroesophageal reflux disease)    Heart murmur    diagnosed with athlete's murmur at 17   History of kidney stones    Hyperlipidemia    Pure hypercholesterolemia    Sleep apnea    CPAP at night   Past Surgical History:  Procedure Laterality Date   INGUINAL HERNIA REPAIR     IRRIGATION AND DEBRIDEMENT SEBACEOUS CYST     KNEE SURGERY Left 2013   PERIPHERAL VASCULAR BALLOON ANGIOPLASTY Right 03/23/2022   Procedure: PERIPHERAL VASCULAR BALLOON ANGIOPLASTY;  Surgeon: Serene Gaile ORN, MD;  Location: [facility];  Service: Cardiovascular;  Laterality: Right;   PERIPHERAL VASCULAR THROMBECTOMY N/A 03/23/2022   Procedure: PERIPHERAL VASCULAR THROMBECTOMY;  Surgeon: Serene Gaile ORN, MD;  Location: [facility];  Service: Cardiovascular;  Laterality: N/A;   SHOULDER SURGERY Right 2013   TOTAL HIP ARTHROPLASTY Right 11/20/2019   Procedure: RIGHT TOTAL HIP ARTHROPLASTY ANTERIOR APPROACH;  Surgeon: Vernetta Lonni GRADE, MD;  Location: [facility];  Service: Orthopedics;  Laterality: Right;   WISDOM TOOTH EXTRACTION     Patient Active Problem List   Diagnosis Date Noted   Obesity (BMI 30.0-34.9) 11/11/2022   Osteoarthritis of left glenohumeral joint 06/04/2020   Pulmonary embolism (HCC) 11/27/2019   OSA on CPAP    Status post total replacement of right hip 11/20/2019   Unilateral primary osteoarthritis, right hip 07/18/2019   Right inguinal  hernia 08/29/2018   Fatigue 08/13/2018   Hyperestrogenism 10/20/2017   Low testosterone  in male 10/20/2017   Vitamin B12 deficiency (non anemic) 10/20/2017   Encounter for screening colonoscopy 05/20/2017   Family history of malignant neoplasm of prostate 05/30/2015   Malignant neoplasm of lateral wall of urinary bladder (HCC) 05/30/2015   Elevated prostate specific antigen (PSA) 03/24/2015   Depression 08/03/2012   Low libido 05/03/2012   ED (erectile dysfunction) of organic origin 11/03/2011   Recurrent nephrolithiasis 11/03/2011   Renal cyst 11/03/2011   Cardiovascular risk factor 12/13/2010   Hyperlipidemia 10/18/2008    PCP: Roetta Blunt, MD  REFERRING PROVIDER: Rubie Kemps, MD  REFERRING DIAG:  Diagnosis  M17.12 (ICD-10-CM) - Unilateral primary osteoarthritis, left knee     Free Text Diagnosis  Lt TKA 12/18    THERAPY DIAG:  Decreased range of motion of left knee  Surgical aftercare, musculoskeletal system  Total knee replacement status, left  Acute pain of left knee  Rationale for Evaluation and Treatment: Rehabilitation  ONSET DATE: 05/31/24 (Procedure)   SUBJECTIVE:   SUBJECTIVE STATEMENT: saw MD and he felt I was on track with everything. Cold weather makes it hurt a bit more   Pt confirmed surgery was performed 05/31/24. Pt states pain is at a 7.5/10 and has been consistently since surgery. Pt reports he has been icing his knee and has  taken some pain medication that has not been helping much. 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    PERTINENT HISTORY: See PMH chart above PAIN:  Are you having pain? Yes: NPRS scale: 5/10 Pain location: L knee Pain description: Discomfort Aggravating factors: Prolonged standing/walking Relieving factors: Rest  PRECAUTIONS: None  RED FLAGS: None   WEIGHT BEARING RESTRICTIONS: No  FALLS:  Has patient fallen in last 6 months? No  LIVING  ENVIRONMENT: Lives with: lives alone Lives in: House/apartment Stairs: Yes: Internal: 15 steps; on right going up Has following equipment at home: Vannie - 2 wheeled  OCCUPATION: Psychologist, sport and exercise   PLOF: Independent  PATIENT GOALS: Return to work, driving, and household responsibilities without increase in pain.   NEXT MD VISIT: 06/12/24 removal of stitches   OBJECTIVE:  Note: Objective measures were completed at Evaluation unless otherwise noted.  DIAGNOSTIC FINDINGS: Imaging ordered on 12/29- US  Venous Img Lower Unilateral Left (DVT)   PATIENT SURVEYS:  LEFS: Lower Extremity Functional Score: 10 / 80 = 12.5 %  COGNITION: Overall cognitive status: Within functional limits for tasks assessed     SENSATION: [facility]   LOWER EXTREMITY ROM:  Active ROM Right eval Left eval Left 06/25/24 Left 07/04/23 07/05/24  Hip flexion WNL WNL     Hip extension       Hip abduction       Hip adduction       Hip internal rotation       Hip external rotation       Knee flexion WNL 90 101 92/ P106 94  Knee extension WNL -25 21 19/ P15 16  Ankle dorsiflexion WNL      Ankle plantarflexion       Ankle inversion       Ankle eversion        (Blank rows = not tested)  LOWER EXTREMITY MMT:  MMT Right eval Left eval  Hip flexion WNL   Hip extension    Hip abduction    Hip adduction    Hip internal rotation    Hip external rotation    Knee flexion WNL 2/5  Knee extension WNL 2/5  Ankle dorsiflexion WNL   Ankle plantarflexion    Ankle inversion    Ankle eversion     (Blank rows = not tested)   FUNCTIONAL TESTS:   TUG: 20s  30s STS: Unable to do without use of UE  LEFS: Lower Extremity Functional Score: 10 / 80 = 12.5 %  GAIT: Distance walked: In Clinic  Assistive device utilized: Walker - 2 wheeled Level of assistance: Complete Independence Comments: Decreased stance time on LLE during gait, decreased knee extension upon heel strike, use of UE on RW  TREATMENT DATE  07/12/24 Nustep L 5 LE only Belt mobs to increase flex and ext AROM  14 -107  Pass ext 10 Standing TKE with blue tband 2 sets 10 Fitter ext 2 blue 2 sets 10 20# SL leg press 10, unlocked 10 x for ROM 6in forward & lateral step ups x10 each with blue tband for TKE TM off Push back for ext 15 x Vaso medium pressure 10 min after for swelling  07/05/24 NuStep L5 x 8 min Distration Mobilization  Tibiofemoral joint mobs  Leg ext Manual Mobilization  PROM w/ end range holds Quad Sets Foam roller under L LE  Supine for game ready, vasopneumatic with L LE elevated, 10 min to assist with edema and exercise soreness    07/03/24 NuStep L 5 x 6 min HS curls 25lb 2x10, LLE 15lb 2x10 Leg Ext 10lb 2x10, RLE 5lb 2x10 L knee PROM w/ end range holds  Patellar mobs  Tibiofemoral  Jt mobs 6in forward & lateral step ups x10 each Leg press 60lb 2x15 Passive LLE HS stretch and knee Ext stretch  06/28/24 NuStep L4 x 6 min LE only 50lb leg press 2x15  50lblb Heel raises  LLE 20lb 2x10 Forward and lateral step ups 6in x 10 each HS curls 35lb x15  LLE 15lb 2x10 Leg Ext 10lb x15  LLE 5lb 2x10 L knee PROM w/ end range holds  Patellar mobs  Tibiofemoral  Jt mobs  06/26/23 NuStep L 5 x 6 min 30lb resisted gait 30lb 4 way x 3 each HS curls 35lb 2x10 Leg Ext 5lb 2x10 L knee PROM w/ end range holds  Patellar mobs  Tibiofemoral  Jt mobs S2S 2x10  LLE LAQ 3lb 2x10 50lb leg press 2x10 LLE 20lb 2x5  06/21/24 NuStep L5 x  STS 2x10 Step Ups 4 1x10 6 1x10 Lateral banded walking Rtband Ambulation- gait training 375' Quad Sets half foam 2x10  06/18/24 NuStep L5 x 6 min Sit to stand 2x10 w/ progressive lowering of mat TUG 8.40 sec L knee PROM w/ end range holds  Patellar mobs  Tibiofemoral  Jt mobs HS curls green LLE 2x10 LAQ LLE 2lb 3x10 Leg press 40lb 2x10  LLE  20lb 2x5 TKE Slant board calf stretch   06/13/24 L knee PROM w/ end range holds  Patellar mobs  Tibiofemoral  Jt mobs  LLE HS curls red 2x10  LLE LAQ 2x10 Sit to stands 2x10 from elevated mat  NuStep L5 x 6 min Standing march Leg press 40lb 2x10  06/11/24- Eval    PATIENT EDUCATION:  Education details: HEP Person educated: Patient Education method: Medical Illustrator Education comprehension: verbalized understanding and returned demonstration  HOME EXERCISE PROGRAM:  Access Code: [email or web address] Date: 06/11/2024 Exercises - Seated Heel Slide  - 1 x daily - 7 x weekly - 2 sets - 10 reps - 3 hold - Supine Quad Set  - 1 x daily - 7 x weekly - 2 sets - 10 reps - 3 hold - Side to Side Weight Shift with Counter Support  - 1 x daily - 7 x weekly - 2 sets - 10 reps   ASSESSMENT:  CLINICAL IMPRESSION:  Pnt saw MD earlier in week and states overall he was pleased and felt he was on track. Pnt amb in with improved gait.Tolerated increased ex with focus on TKE with cuing- verb and tactile. ROM improving but stil strugglimg with TKE d/t tightness and weakness.  Patient is a 69 y.o.  male who was seen today for physical therapy treatment for LLE knee s/p TKA. S/p L TKA.  Pt with decreased ROM and decreased strength in LLE at the knee joint.  Pt enters ambulating with SPC in incorrect had, so he was corrected and given skilled instruction on proper SPC use. Session completed without AD.  End range pain with PROM, despite soft end feel for flexion. Progressed to LE strengthening without issue. Cue for eccentric control with leg press.  Pt will benefit from PROM and AAROM in early stages to gradually progress mobility.  Pt has hx of DVT applies TED hose socks.   OBJECTIVE IMPAIRMENTS: Abnormal gait, decreased endurance, decreased ROM, decreased strength, and pain.   ACTIVITY LIMITATIONS: carrying and lifting  PARTICIPATION LIMITATIONS: cleaning and laundry  REHAB POTENTIAL:  Good  CLINICAL DECISION MAKING: Stable/uncomplicated  EVALUATION COMPLEXITY: Low   GOALS: Goals reviewed with patient? Yes  SHORT TERM GOALS: Target date: 07/16/24  Patient will be independent with initial HEP. Baseline:  Goal status: Progressing ~50% doing 07/05/24  2. Pt will improve TUG score to <14s to show improved functional mobility.   Baseline: 20s with RW   Goal Status: Met 06/19/23     LONG TERM GOALS: Target date: 09/03/24  Patient will be independent with advanced/ongoing HEP to improve outcomes and carryover.  Baseline:  Goal status: IN PROGRESS 07/05/24  2.  Patient will report at least 75% improvement in L knee pain to improve QOL. Baseline: 7.5/10 Goal status: IN PROGRESS 6/10 at worse 06/21/24; unchanged 07/05/24  progressing 07/12/24  3.  Patient will demonstrate improved active knee AROM to WNL to allow for normal gait and stair mechanics. Baseline:  Goal status: INITIAL -25-0-95 06/21/24, Progressing 06/26/23; -16-0-94 07/05/24   Progressing 07/12/24  4.  Patient will demonstrate improved functional LE strength as demonstrated by 5/5 strength in LLE. Baseline: See Chart Goal status: IN PROGRESS 07/05/24 and 07/12/24  5.  Patient will be able to ambulate 600' with LRAD and normal gait pattern without increased pain to access community.  Baseline:  Goal status: IN PROGRESS 07/05/24 and 07/12/24  6. Patient will be able to ascend/descend stairs with 1 HR and reciprocal step pattern safely to access home and community.  Baseline:  Goal status: IN PROGRESS 1 step at a time 1 HR 07/05/24  7.  Patient will report 20 point increase on LEFS to demonstrate improved functional ability. Baseline: 10 Goal status: IN PROGRESS Lower Extremity Functional Score: 16 / 80 = 20.0 % 07/05/24   PLAN:  PT FREQUENCY: 2x/week  PT DURATION: 10 weeks  PLANNED INTERVENTIONS: 97110-Therapeutic exercises, 97530- Therapeutic activity, 97112- Neuromuscular re-education, 97535- Self Care,  [id number]- Manual therapy, [phone]- Gait training, [id number]- Vasopneumatic device, Patient/Family education, and Cryotherapy  PLAN FOR NEXT SESSION: PROM knee flex/ext, Func stengthening   Chessica Audia,ANGIE, PTA 07/12/2024, 5:33 PM  "

## 2024-07-17 ENCOUNTER — Ambulatory Visit: Admitting: Physical Therapy

## 2024-07-17 ENCOUNTER — Encounter: Payer: Self-pay | Admitting: Physical Therapy

## 2024-07-17 DIAGNOSIS — Z96652 Presence of left artificial knee joint: Secondary | ICD-10-CM

## 2024-07-17 DIAGNOSIS — M25562 Pain in left knee: Secondary | ICD-10-CM

## 2024-07-17 DIAGNOSIS — M25662 Stiffness of left knee, not elsewhere classified: Secondary | ICD-10-CM

## 2024-07-17 DIAGNOSIS — Z4789 Encounter for other orthopedic aftercare: Secondary | ICD-10-CM

## 2024-07-18 ENCOUNTER — Other Ambulatory Visit: Payer: Self-pay | Admitting: *Deleted

## 2024-07-18 MED ORDER — RIVAROXABAN 20 MG PO TABS: 20.0000 mg | ORAL_TABLET | Freq: Every day | ORAL | 6 refills | Status: AC

## 2024-07-19 ENCOUNTER — Ambulatory Visit: Admitting: Physical Therapy

## 2024-07-19 DIAGNOSIS — M25662 Stiffness of left knee, not elsewhere classified: Secondary | ICD-10-CM

## 2024-07-19 DIAGNOSIS — Z96652 Presence of left artificial knee joint: Secondary | ICD-10-CM

## 2024-07-19 DIAGNOSIS — M25562 Pain in left knee: Secondary | ICD-10-CM

## 2024-07-19 NOTE — Therapy (Signed)
 " OUTPATIENT PHYSICAL THERAPY LOWER EXTREMITY TREATMENT     [name] MRN: [id number] DOB:01-11-1956, 69 y.o., male Today's Date: 07/19/2024  END OF SESSION:  PT End of Session - 07/19/24 1645     Visit Number 11    Date for Recertification  09/03/24    Authorization Type [employer] Medicare    PT Start Time 1645    PT Stop Time 1730    PT Time Calculation (min) 45 min         Past Medical History:  Diagnosis Date   Arthritis    knees   Cancer (HCC)    bladder cancer   DVT (deep venous thrombosis) (HCC)    GERD (gastroesophageal reflux disease)    Heart murmur    diagnosed with athlete's murmur at 17   History of kidney stones    Hyperlipidemia    Pure hypercholesterolemia    Sleep apnea    CPAP at night   Past Surgical History:  Procedure Laterality Date   INGUINAL HERNIA REPAIR     IRRIGATION AND DEBRIDEMENT SEBACEOUS CYST     KNEE SURGERY Left 2013   PERIPHERAL VASCULAR BALLOON ANGIOPLASTY Right 03/23/2022   Procedure: PERIPHERAL VASCULAR BALLOON ANGIOPLASTY;  Surgeon: Serene Gaile ORN, MD;  Location: [facility];  Service: Cardiovascular;  Laterality: Right;   PERIPHERAL VASCULAR THROMBECTOMY N/A 03/23/2022   Procedure: PERIPHERAL VASCULAR THROMBECTOMY;  Surgeon: Serene Gaile ORN, MD;  Location: [facility];  Service: Cardiovascular;  Laterality: N/A;   SHOULDER SURGERY Right 2013   TOTAL HIP ARTHROPLASTY Right 11/20/2019   Procedure: RIGHT TOTAL HIP ARTHROPLASTY ANTERIOR APPROACH;  Surgeon: Vernetta Lonni GRADE, MD;  Location: [facility];  Service: Orthopedics;  Laterality: Right;   WISDOM TOOTH EXTRACTION     Patient Active Problem List   Diagnosis Date Noted   Obesity (BMI 30.0-34.9) 11/11/2022   Osteoarthritis of left glenohumeral joint 06/04/2020   Pulmonary embolism (HCC) 11/27/2019   OSA on CPAP    Status post total replacement of right hip 11/20/2019   Unilateral primary osteoarthritis, right hip 07/18/2019   Right  inguinal hernia 08/29/2018   Fatigue 08/13/2018   Hyperestrogenism 10/20/2017   Low testosterone  in male 10/20/2017   Vitamin B12 deficiency (non anemic) 10/20/2017   Encounter for screening colonoscopy 05/20/2017   Family history of malignant neoplasm of prostate 05/30/2015   Malignant neoplasm of lateral wall of urinary bladder (HCC) 05/30/2015   Elevated prostate specific antigen (PSA) 03/24/2015   Depression 08/03/2012   Low libido 05/03/2012   ED (erectile dysfunction) of organic origin 11/03/2011   Recurrent nephrolithiasis 11/03/2011   Renal cyst 11/03/2011   Cardiovascular risk factor 12/13/2010   Hyperlipidemia 10/18/2008    PCP: Roetta Blunt, MD  REFERRING PROVIDER: Rubie Kemps, MD  REFERRING DIAG:  Diagnosis  M17.12 (ICD-10-CM) - Unilateral primary osteoarthritis, left knee     Free Text Diagnosis  Lt TKA 12/18    THERAPY DIAG:  Decreased range of motion of left knee  Total knee replacement status, left  Acute pain of left knee  Rationale for Evaluation and Treatment: Rehabilitation  ONSET DATE: 05/31/24 (Procedure)   SUBJECTIVE:   SUBJECTIVE STATEMENT: cold weather bothers me a bit. Get pretty stiff after siting  Pt confirmed surgery was performed 05/31/24. Pt states pain is at a 7.5/10 and has been consistently since surgery. Pt reports he has been icing his knee and has taken some pain medication that has not been helping much. 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    PERTINENT HISTORY: See PMH chart above PAIN:  Are you having pain? Yes: NPRS scale: 4/10 Pain location: L knee Pain description: Discomfort Aggravating factors: Prolonged standing/walking Relieving factors: Rest  PRECAUTIONS: None  RED FLAGS: None   WEIGHT BEARING RESTRICTIONS: No  FALLS:  Has patient fallen in last 6 months? No  LIVING ENVIRONMENT: Lives with: lives alone Lives in: House/apartment Stairs: Yes:  Internal: 15 steps; on right going up Has following equipment at home: Vannie - 2 wheeled  OCCUPATION: Psychologist, sport and exercise   PLOF: Independent  PATIENT GOALS: Return to work, driving, and household responsibilities without increase in pain.   NEXT MD VISIT: 06/12/24 removal of stitches   OBJECTIVE:  Note: Objective measures were completed at Evaluation unless otherwise noted.  DIAGNOSTIC FINDINGS: Imaging ordered on 12/29- US  Venous Img Lower Unilateral Left (DVT)   PATIENT SURVEYS:  LEFS: Lower Extremity Functional Score: 10 / 80 = 12.5 %  COGNITION: Overall cognitive status: Within functional limits for tasks assessed     SENSATION: [facility]   LOWER EXTREMITY ROM:  Active ROM Right eval Left eval Left 06/25/24 Left 07/04/23 07/05/24  Hip flexion WNL WNL     Hip extension       Hip abduction       Hip adduction       Hip internal rotation       Hip external rotation       Knee flexion WNL 90 101 92/ P106 94  Knee extension WNL -25 21 19/ P15 16  Ankle dorsiflexion WNL      Ankle plantarflexion       Ankle inversion       Ankle eversion        (Blank rows = not tested)  LOWER EXTREMITY MMT:  MMT Right eval Left eval  Hip flexion WNL   Hip extension    Hip abduction    Hip adduction    Hip internal rotation    Hip external rotation    Knee flexion WNL 2/5  Knee extension WNL 2/5  Ankle dorsiflexion WNL   Ankle plantarflexion    Ankle inversion    Ankle eversion     (Blank rows = not tested)   FUNCTIONAL TESTS:   TUG: 20s  30s STS: Unable to do without use of UE  LEFS: Lower Extremity Functional Score: 10 / 80 = 12.5 %  GAIT: Distance walked: In Clinic  Assistive device utilized: Vannie - 2 wheeled Level of assistance: Complete Independence Comments: Decreased stance time on LLE during gait, decreased knee extension upon heel strike, use of UE on RW                                                                                                                                  TREATMENT DATE  07/19/24  Nustep L 5  LE only Elliptical 2 min fwd /1 min back PROM and stretch left knee AROM  14-111 30# cable pulley SL press down 2 sets 10 3# prone ext 10x 2 sets 10 3# prone HS curl 10 x 2 sets  Feet on ball bridge 2 set 10 3 sec hold Supine flexion stretch into resisted leg press with ext stretch 10x Left HS curl 15# 2 sets 10 Left knee ext 5# 10 x, 103 up with 2 eccentric lowering left  After ex ext ACT  14  PASS  4 VASO after for pain and swelling      07/17/24 Bike partial revs x 4 minutes Nustep level 5 x 5 minutes LE only Lunge step stretch Left LE HS curls 15# Back to wall Blue tband TKE and ball behind knee TKE Leg press 20# working on flexion trying to get lower and then cues for activation and getting extension 5# SAQ 2x10 verbal and tactile cues for TKE STM to the scar, joint mobs, patellar mobs Passive stretch flexion and extension Educated on LLLD stretches in chair for left knee flexion and extension Vaso medium pressure 34 degrees in elevation left knee  07/12/24 Nustep L 5 LE only Belt mobs to increase flex and ext AROM  14 -107  Pass ext 10 Standing TKE with blue tband 2 sets 10 Fitter ext 2 blue 2 sets 10 20# SL leg press 10, unlocked 10 x for ROM 6in forward & lateral step ups x10 each with blue tband for TKE TM off Push back for ext 15 x Vaso medium pressure 10 min after for swelling  07/05/24 NuStep L5 x 8 min Distration Mobilization  Tibiofemoral joint mobs  Leg ext Manual Mobilization  PROM w/ end range holds Quad Sets Foam roller under L LE  Supine for game ready, vasopneumatic with L LE elevated, 10 min to assist with edema and exercise soreness    07/03/24 NuStep L 5 x 6 min HS curls 25lb 2x10, LLE 15lb 2x10 Leg Ext 10lb 2x10, RLE 5lb 2x10 L knee PROM w/ end range holds  Patellar mobs  Tibiofemoral  Jt mobs 6in forward & lateral step ups x10 each Leg press 60lb 2x15 Passive LLE HS  stretch and knee Ext stretch  06/28/24 NuStep L4 x 6 min LE only 50lb leg press 2x15  50lblb Heel raises  LLE 20lb 2x10 Forward and lateral step ups 6in x 10 each HS curls 35lb x15  LLE 15lb 2x10 Leg Ext 10lb x15  LLE 5lb 2x10 L knee PROM w/ end range holds  Patellar mobs  Tibiofemoral  Jt mobs  06/26/23 NuStep L 5 x 6 min 30lb resisted gait 30lb 4 way x 3 each HS curls 35lb 2x10 Leg Ext 5lb 2x10 L knee PROM w/ end range holds  Patellar mobs  Tibiofemoral  Jt mobs S2S 2x10  LLE LAQ 3lb 2x10 50lb leg press 2x10 LLE 20lb 2x5  06/21/24 NuStep L5 x  STS 2x10 Step Ups 4 1x10 6 1x10 Lateral banded walking Rtband Ambulation- gait training 375' Quad Sets half foam 2x10  06/18/24 NuStep L5 x 6 min Sit to stand 2x10 w/ progressive lowering of mat TUG 8.40 sec L knee PROM w/ end range holds  Patellar mobs  Tibiofemoral  Jt mobs HS curls green LLE 2x10 LAQ LLE 2lb 3x10 Leg press 40lb 2x10  LLE 20lb 2x5 TKE Slant board calf stretch   06/13/24 L knee PROM w/ end range holds  Patellar mobs  Tibiofemoral  Jt mobs  LLE HS curls red 2x10  LLE LAQ 2x10 Sit to stands 2x10 from elevated mat  NuStep L5 x 6 min Standing march Leg press 40lb 2x10  06/11/24- Eval    PATIENT EDUCATION:  Education details: HEP Person educated: Patient Education method: Medical Illustrator Education comprehension: verbalized understanding and returned demonstration  HOME EXERCISE PROGRAM:  Access Code: [email or web address] Date: 06/11/2024 Exercises - Seated Heel Slide  - 1 x daily - 7 x weekly - 2 sets - 10 reps - 3 hold - Supine Quad Set  - 1 x daily - 7 x weekly - 2 sets - 10 reps - 3 hold - Side to Side Weight Shift with Counter Support  - 1 x daily - 7 x weekly - 2 sets - 10 reps   ASSESSMENT:  CLINICAL IMPRESSION:  Pnt amb in with better flexion in swing phase and better cadence.. Cued for gait to heel strike to help ext. Increased exercises today to increase and promote ext  with cuing. ROM measurements above  Patient is a 69 y.o. male who was seen today for physical therapy treatment for LLE knee s/p TKA. S/p L TKA.  Pt with decreased ROM and decreased strength in LLE at the knee joint.  Pt enters ambulating with SPC in incorrect had, so he was corrected and given skilled instruction on proper SPC use. Session completed without AD.  End range pain with PROM, despite soft end feel for flexion. Progressed to LE strengthening without issue. Cue for eccentric control with leg press.  Pt will benefit from PROM and AAROM in early stages to gradually progress mobility.  Pt has hx of DVT applies TED hose socks.   OBJECTIVE IMPAIRMENTS: Abnormal gait, decreased endurance, decreased ROM, decreased strength, and pain.   ACTIVITY LIMITATIONS: carrying and lifting  PARTICIPATION LIMITATIONS: cleaning and laundry  REHAB POTENTIAL: Good  CLINICAL DECISION MAKING: Stable/uncomplicated  EVALUATION COMPLEXITY: Low   GOALS: Goals reviewed with patient? Yes  SHORT TERM GOALS: Target date: 07/16/24  Patient will be independent with initial HEP. Baseline:  Goal status: Progressing ~50% doing 07/05/24  MET 07/19/24  2. Pt will improve TUG score to <14s to show improved functional mobility.   Baseline: 20s with RW   Goal Status: Met 06/19/23     LONG TERM GOALS: Target date: 09/03/24  Patient will be independent with advanced/ongoing HEP to improve outcomes and carryover.  Baseline:  Goal status: IN PROGRESS 07/05/24  2.  Patient will report at least 75% improvement in L knee pain to improve QOL. Baseline: 7.5/10 Goal status: IN PROGRESS 6/10 at worse 06/21/24; unchanged 07/05/24  progressing 07/12/24 and 07/19/24  3.  Patient will demonstrate improved active knee AROM to WNL to allow for normal gait and stair mechanics. Baseline:  Goal status: INITIAL -25-0-95 06/21/24, Progressing 06/26/23; -16-0-94 07/05/24   Progressing 07/12/24 and 07/19/24  4.  Patient will demonstrate improved  functional LE strength as demonstrated by 5/5 strength in LLE. Baseline: See Chart Goal status: IN PROGRESS 07/05/24 and 07/12/24  5.  Patient will be able to ambulate 600' with LRAD and normal gait pattern without increased pain to access community.  Baseline:  Goal status: IN PROGRESS 07/05/24 and 07/12/24 and 07/19/24  6. Patient will be able to ascend/descend stairs with 1 HR and reciprocal step pattern safely to access home and community.  Baseline:  Goal status: IN PROGRESS 1 step at a time 1 HR 07/05/24  7.  Patient will report  20 point increase on LEFS to demonstrate improved functional ability. Baseline: 10 Goal status: IN PROGRESS Lower Extremity Functional Score: 16 / 80 = 20.0 % 07/05/24   PLAN:  PT FREQUENCY: 2x/week  PT DURATION: 10 weeks  PLANNED INTERVENTIONS: 97110-Therapeutic exercises, 97530- Therapeutic activity, 97112- Neuromuscular re-education, 97535- Self Care, [id number]- Manual therapy, [phone]- Gait training, [phone]- Vasopneumatic device, Patient/Family education, and Cryotherapy  PLAN FOR NEXT SESSION: PROM knee flex/ext, Func stengthening,    Badr Piedra,ANGIE, PTA 07/19/2024, 4:45 PM  "

## 2024-07-24 ENCOUNTER — Ambulatory Visit: Admitting: Physical Therapy

## 2024-07-26 ENCOUNTER — Ambulatory Visit: Admitting: Physical Therapy

## 2024-07-31 ENCOUNTER — Ambulatory Visit

## 2024-08-02 ENCOUNTER — Ambulatory Visit: Admitting: Physical Therapy

## 2024-08-06 ENCOUNTER — Ambulatory Visit: Admitting: Physical Therapy

## 2024-08-09 ENCOUNTER — Ambulatory Visit: Admitting: Physical Therapy

## 2024-10-22 ENCOUNTER — Encounter (INDEPENDENT_AMBULATORY_CARE_PROVIDER_SITE_OTHER): Admitting: Ophthalmology
